# Patient Record
Sex: Male | Born: 1945
Health system: Southern US, Community
[De-identification: ages and names within clinical notes are randomized; demographics above are authoritative.]

## PROBLEM LIST (undated history)

## (undated) DIAGNOSIS — I509 Heart failure, unspecified: Secondary | ICD-10-CM

## (undated) DIAGNOSIS — Z8616 Personal history of COVID-19: Secondary | ICD-10-CM

## (undated) DIAGNOSIS — I10 Essential (primary) hypertension: Secondary | ICD-10-CM

## (undated) DIAGNOSIS — J309 Allergic rhinitis, unspecified: Secondary | ICD-10-CM

## (undated) DIAGNOSIS — I872 Venous insufficiency (chronic) (peripheral): Secondary | ICD-10-CM

## (undated) DIAGNOSIS — J449 Chronic obstructive pulmonary disease, unspecified: Secondary | ICD-10-CM

## (undated) DIAGNOSIS — I4891 Unspecified atrial fibrillation: Secondary | ICD-10-CM

## (undated) DIAGNOSIS — C4490 Unspecified malignant neoplasm of skin, unspecified: Secondary | ICD-10-CM

## (undated) DIAGNOSIS — E785 Hyperlipidemia, unspecified: Secondary | ICD-10-CM

## (undated) DIAGNOSIS — R011 Cardiac murmur, unspecified: Secondary | ICD-10-CM

## (undated) DIAGNOSIS — K5730 Diverticulosis of large intestine without perforation or abscess without bleeding: Secondary | ICD-10-CM

## (undated) DIAGNOSIS — F419 Anxiety disorder, unspecified: Secondary | ICD-10-CM

## (undated) DIAGNOSIS — N529 Male erectile dysfunction, unspecified: Secondary | ICD-10-CM

## (undated) HISTORY — DX: Chronic obstructive pulmonary disease, unspecified: J44.9

## (undated) HISTORY — DX: Allergic rhinitis, unspecified: J30.9

## (undated) HISTORY — DX: Unspecified atrial fibrillation: I48.91

## (undated) HISTORY — DX: Personal history of COVID-19: Z86.16

## (undated) HISTORY — DX: Unspecified malignant neoplasm of skin, unspecified: C44.90

## (undated) HISTORY — PX: TONSILLECTOMY AND ADENOIDECTOMY: SUR1326

## (undated) HISTORY — DX: Cardiac murmur, unspecified: R01.1

## (undated) HISTORY — DX: Essential (primary) hypertension: I10

## (undated) HISTORY — DX: Hyperlipidemia, unspecified: E78.5

## (undated) HISTORY — PX: VEIN LIGATION AND STRIPPING: SHX2653

## (undated) HISTORY — DX: Male erectile dysfunction, unspecified: N52.9

## (undated) HISTORY — DX: Venous insufficiency (chronic) (peripheral): I87.2

## (undated) HISTORY — DX: Diverticulosis of large intestine without perforation or abscess without bleeding: K57.30

## (undated) HISTORY — DX: Anxiety disorder, unspecified: F41.9

## (undated) HISTORY — PX: OTHER SURGICAL HISTORY: SHX169

## (undated) SURGERY — ATRIAL FIBRILLATION ABLATION
Anesthesia: Monitor Anesthesia Care

---

## 2006-05-08 ENCOUNTER — Ambulatory Visit: Payer: Self-pay | Admitting: Internal Medicine

## 2006-05-12 ENCOUNTER — Other Ambulatory Visit: Payer: Self-pay

## 2006-05-12 ENCOUNTER — Ambulatory Visit: Payer: Self-pay | Admitting: Internal Medicine

## 2010-06-27 HISTORY — PX: SKIN CANCER EXCISION: SHX779

## 2011-01-18 DIAGNOSIS — E7800 Pure hypercholesterolemia, unspecified: Secondary | ICD-10-CM | POA: Insufficient documentation

## 2011-01-18 DIAGNOSIS — Z85828 Personal history of other malignant neoplasm of skin: Secondary | ICD-10-CM | POA: Insufficient documentation

## 2011-01-18 DIAGNOSIS — Z9889 Other specified postprocedural states: Secondary | ICD-10-CM | POA: Insufficient documentation

## 2011-04-15 DIAGNOSIS — Z79899 Other long term (current) drug therapy: Secondary | ICD-10-CM | POA: Insufficient documentation

## 2011-06-17 ENCOUNTER — Ambulatory Visit: Payer: Self-pay | Admitting: Family Medicine

## 2011-11-24 DIAGNOSIS — R001 Bradycardia, unspecified: Secondary | ICD-10-CM | POA: Insufficient documentation

## 2012-03-23 DIAGNOSIS — G4733 Obstructive sleep apnea (adult) (pediatric): Secondary | ICD-10-CM | POA: Insufficient documentation

## 2012-06-21 LAB — PSA: PSA: 4.66

## 2012-10-20 ENCOUNTER — Encounter: Payer: Self-pay | Admitting: Cardiovascular Disease

## 2012-10-20 ENCOUNTER — Ambulatory Visit (INDEPENDENT_AMBULATORY_CARE_PROVIDER_SITE_OTHER): Payer: 59 | Admitting: Cardiovascular Disease

## 2012-10-20 ENCOUNTER — Encounter: Payer: Self-pay | Admitting: *Deleted

## 2012-10-20 VITALS — BP 110/68 | HR 63 | Ht 71.0 in | Wt 174.5 lb

## 2012-10-20 DIAGNOSIS — I4891 Unspecified atrial fibrillation: Secondary | ICD-10-CM | POA: Insufficient documentation

## 2012-10-20 NOTE — Patient Instructions (Addendum)
Your physician wants you to follow-up in: 6 months with Dr. Nahser. You will receive a reminder letter in the mail two months in advance. If you don't receive a letter, please call our office to schedule the follow-up appointment.  

## 2012-10-20 NOTE — Progress Notes (Signed)
Steven Griffin Date of Birth  January 03, 1946       [facility] Office [address]  [address] [place], [place]  [address]   [place], [place]  [address] [phone]     [phone]   Fax  [phone]    Fax [phone]  Problem List: 1. Atrial fibrillation 2. Mild Chronic systolic Congestive heart failure-ejection fraction of  45-50% by echo, 25-35% by TEE in 2008  History of Present Illness:  Steven Griffin is a 67 yo with hx of atrial fibrillation.  He as been on [place] for 5  years.  He went to Dr. Macon Griffin at [facility] who he has been seeing for years.   He has had brief epidoses of atrial fib (30 min. - 1 hr)  .  Yesterday, he had a 3 hour episode of AF.  He converted to NSR spontaneously at that time.  He called his doctor at [facility] and was suggested that he find a local cardiologist to help manage these episodes of paroxysmal atrial fibrillation.   He has noted some leg swelling / ankle swlling recently - thinks it was due to localized swelling due to walking on the golf course at [place].    He exercises 30 minutes a day.    He has otherwise been healthy. He does not have any episodes of chest pain or shortness of breath. He is able to do all of his normal activities without any significant problems.   Dr. Macon Griffin has suggested that he get an ablation.   Steven Griffin has been somewhat hesitant to get the ablation.  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    Current Outpatient Prescriptions  Medication Sig Dispense Refill  . aspirin 325 MG tablet Take 325 mg by mouth daily.      . Cholecalciferol (VITAMIN D3) 3000 UNITS TABS Take by mouth daily.      Marland Kitchen diltiazem (CARDIZEM) 30 MG tablet Take 30 mg by mouth 4 (four) times daily as needed.      . dofetilide (TIKOSYN) 125 MCG capsule Takes 3 tablets twice daily.      . fexofenadine (ALLEGRA) 180 MG tablet Take 180 mg by mouth daily.      Marland Kitchen gemfibrozil (LOPID) 600 MG tablet  Take 600 mg by mouth daily.       Marland Kitchen lisinopril (PRINIVIL,ZESTRIL) 5 MG tablet Take 5 mg by mouth daily.      . Multiple Vitamin (MULTIVITAMIN) tablet Take 1 tablet by mouth daily.      . Omega-3 Fatty Acids (FISH OIL) 1000 MG CAPS Take by mouth daily.      . tamsulosin (FLOMAX) 0.4 MG CAPS Take 0.4 mg by mouth daily.       No current facility-administered medications for this visit.     No Known Allergies  Past Medical History  Diagnosis Date  . A-fib   . Mild anxiety   . Benign tumor     left axilla  . Impotence of organic origin   . COPD (chronic obstructive pulmonary disease)   . Atrial fibrillation   . Special screening for malignant neoplasm of prostate   . Hyperlipidemia   . Essential hypertension, benign   . Other abnormal glucose   . Diverticulosis of colon (without mention of hemorrhage)   . Allergic rhinitis, cause unspecified   . Unspecified venous (peripheral) insufficiency     Past Surgical History  Procedure Laterality Date  . Varicose veins    . Tonsillectomy and adenoidectomy    . Rib cartilage      benign tumor    History  Smoking status  . Current Every Day Smoker -- 1.00 packs/day for 30 years  . Types: Cigarettes, Cigars  Smokeless tobacco  . Not on file    History  Alcohol Use No    History reviewed. No pertinent family history.  Reviw of Systems:  Reviewed in the HPI.  All other systems are negative.  Physical Exam: Blood pressure 110/68, pulse 63, height 5\' 11"  (1.803 m), weight 174 lb 8 oz (79.153 kg). General: Well developed, well nourished, in no acute distress.  Head: Normocephalic, atraumatic, sclera non-icteric, mucus membranes are moist,   Neck: Supple. Carotids are 2 + without bruits. No JVD   Lungs: Clear   Heart: RR, normal S1, S2  Abdomen: Soft, non-tender, non-distended with normal bowel sounds.  Msk:  Strength and tone are normal   Extremities: No clubbing or cyanosis. No edema.  Distal pedal pulses are 2+ and  equal    Neuro: CN II - XII intact.  Alert and oriented X 3.   Psych:  Normal  ECG: October 20, 2012:  NSR at 64, RBBB.   Assessment / Plan:

## 2012-10-20 NOTE — Assessment & Plan Note (Addendum)
Steven Griffin is doing well.  He had a longer than normal episode of AF yesterday.  He already has a sleep study scheduled. He denies any excessive alcohol use.  He smokes cigars on occasion.  Today he is back in normal sinus rhythm and feels completely normal. His talk to Dr. Macon Large at [facility] and they had discussed doing an RF ablation.  At this point I do not think we need to change any of his medications. I'll see him again in 6 months for followup visit and EKG.

## 2012-10-22 ENCOUNTER — Ambulatory Visit: Payer: Self-pay | Admitting: Cardiovascular Disease

## 2013-02-15 ENCOUNTER — Ambulatory Visit (INDEPENDENT_AMBULATORY_CARE_PROVIDER_SITE_OTHER): Payer: 59 | Admitting: Cardiovascular Disease

## 2013-02-15 ENCOUNTER — Encounter: Payer: Self-pay | Admitting: Cardiovascular Disease

## 2013-02-15 ENCOUNTER — Encounter (INDEPENDENT_AMBULATORY_CARE_PROVIDER_SITE_OTHER): Payer: Self-pay

## 2013-02-15 VITALS — BP 100/62 | HR 47 | Ht 71.0 in | Wt 178.0 lb

## 2013-02-15 DIAGNOSIS — R079 Chest pain, unspecified: Secondary | ICD-10-CM

## 2013-02-15 DIAGNOSIS — I951 Orthostatic hypotension: Secondary | ICD-10-CM

## 2013-02-15 DIAGNOSIS — I4891 Unspecified atrial fibrillation: Secondary | ICD-10-CM

## 2013-02-15 NOTE — Assessment & Plan Note (Signed)
He has not had any further episodes of A-Fib.  He will follow up with his doctor at [facility].

## 2013-02-15 NOTE — Patient Instructions (Signed)
Your physician wants you to follow-up in: 6 months with Dr. Nahser. You will receive a reminder letter in the mail two months in advance. If you don't receive a letter, please call our office to schedule the follow-up appointment.  Your physician recommends that you continue on your current medications as directed. Please refer to the Current Medication list given to you today.  

## 2013-02-15 NOTE — Assessment & Plan Note (Signed)
He is having problems with orthostatic hypotension.  He does not have these symptoms after working out in his gym - typically only after golf.    I suspect he is volume depleted despite the fact that he insists that he drinks water all day.   I have suggested that he increase his intake of electrolytes (V-8 juice) and protein (hard boiled eggs) and stay better hydrated.   He will also increase the intake of his electrolyte solutions ( instead of water)   Will see him in 6 months.   Will check BMP and Magnesium levels.  He has these labs drawn at [facility] regularly.

## 2013-02-15 NOTE — Progress Notes (Signed)
Steven Griffin Date of Birth  [birth year]       [facility] Office [address]  [address] [place], [place]  [address]   [place], [place]  [address] [phone]     [phone]   Fax  [phone]    Fax [phone]  Problem List: 1. Atrial fibrillation 2. Mild Chronic systolic Congestive heart failure-ejection fraction of  45-50% by echo, 25-35% by TEE in 2008  History of Present Illness:  Steven Griffin is a 67 yo with hx of atrial fibrillation.  He as been on [place] for 5  years.  He went to Dr. Macon Large at [facility] who he has been seeing for years.   He has had brief epidoses of atrial fib (30 min. - 1 hr)  .  Yesterday, he had a 3 hour episode of AF.  He converted to NSR spontaneously at that time.  He called his doctor at [facility] and was suggested that he find a local cardiologist to help manage these episodes of paroxysmal atrial fibrillation.   He has noted some leg swelling / ankle swlling recently - thinks it was due to localized swelling due to walking on the golf course at [place].    He exercises 30 minutes a day.    He has otherwise been healthy. He does not have any episodes of chest pain or shortness of breath. He is able to do all of his normal activities without any significant problems.   Dr. Macon Large has suggested that he get an ablation.   Steven Griffin has been somewhat hesitant to get the ablation.  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    Oct. 21, 2014:  Steven Griffin presents today for some episodes of dizziness and dyspnea - especially during and after playing golf.  He has symptoms of orthostasis after teeing up his ball.   He has no problems working out at [employer]    Current Outpatient Prescriptions  Medication Sig Dispense Refill  . aspirin 325 MG tablet Take 325 mg by mouth daily.      . Cholecalciferol (VITAMIN D3) 3000 UNITS TABS Take by mouth daily.      Marland Kitchen diltiazem (CARDIZEM) 30 MG tablet Take 30 mg  by mouth 4 (four) times daily as needed.      . dofetilide (TIKOSYN) 125 MCG capsule Takes 3 tablets twice daily.      . fexofenadine (ALLEGRA) 180 MG tablet Take 180 mg by mouth daily.      Marland Kitchen lisinopril (PRINIVIL,ZESTRIL) 5 MG tablet Take 5 mg by mouth daily.      . Multiple Vitamin (MULTIVITAMIN) tablet Take 1 tablet by mouth daily.      . Omega-3 Fatty Acids (FISH OIL) 1000 MG CAPS Take by mouth daily.      . tamsulosin (FLOMAX) 0.4 MG CAPS Take 0.4 mg by mouth daily.       No current facility-administered medications for this visit.     No Known Allergies  Past Medical History  Diagnosis Date  . A-fib   . Mild anxiety   . Benign tumor     left axilla  . Impotence of organic origin   . COPD (chronic obstructive pulmonary disease)   . Atrial fibrillation   . Special screening for malignant neoplasm of prostate   . Hyperlipidemia   . Essential hypertension, benign   . Other abnormal glucose   .  Diverticulosis of colon (without mention of hemorrhage)   . Allergic rhinitis, cause unspecified   . Unspecified venous (peripheral) insufficiency     Past Surgical History  Procedure Laterality Date  . Varicose veins    . Tonsillectomy and adenoidectomy    . Rib cartilage      benign tumor    History  Smoking status  . Current Every Day Smoker -- 1.00 packs/day for 30 years  . Types: Cigarettes, Cigars  Smokeless tobacco  . Not on file    History  Alcohol Use  . 0.6 oz/week  . 1 Glasses of wine per week    Family History  Problem Relation Age of Onset  . Family history unknown: Yes    Reviw of Systems:  Reviewed in the HPI.  All other systems are negative.  Physical Exam: Blood pressure 100/62, pulse 47, height 5\' 11"  (1.803 m), weight 178 lb (80.74 kg). General: Well developed, well nourished, in no acute distress.  Head: Normocephalic, atraumatic, sclera non-icteric, mucus membranes are moist,   Neck: Supple. Carotids are 2 + without bruits. No JVD    Lungs: Clear   Heart: RR, normal S1, S2  Abdomen: Soft, non-tender, non-distended with normal bowel sounds.  Msk:  Strength and tone are normal   Extremities: No clubbing or cyanosis. No edema.  Distal pedal pulses are 2+ and equal    Neuro: CN II - XII intact.  Alert and oriented X 3.   Psych:  Normal  ECG: Oct. 21, 2014: Sinus brady at 89,  RBBB with LAD.    Assessment / Plan:

## 2013-03-03 ENCOUNTER — Other Ambulatory Visit: Payer: Self-pay

## 2013-06-07 DIAGNOSIS — I452 Bifascicular block: Secondary | ICD-10-CM | POA: Insufficient documentation

## 2013-08-08 ENCOUNTER — Encounter: Payer: Self-pay | Admitting: Cardiovascular Disease

## 2013-08-08 ENCOUNTER — Ambulatory Visit (INDEPENDENT_AMBULATORY_CARE_PROVIDER_SITE_OTHER): Payer: Medicare Other | Admitting: Cardiovascular Disease

## 2013-08-08 ENCOUNTER — Ambulatory Visit: Payer: 59 | Admitting: Cardiovascular Disease

## 2013-08-08 VITALS — BP 101/70 | HR 76 | Ht 71.0 in | Wt 177.5 lb

## 2013-08-08 DIAGNOSIS — I4891 Unspecified atrial fibrillation: Secondary | ICD-10-CM

## 2013-08-08 DIAGNOSIS — I509 Heart failure, unspecified: Secondary | ICD-10-CM

## 2013-08-08 DIAGNOSIS — I5022 Chronic systolic (congestive) heart failure: Secondary | ICD-10-CM | POA: Insufficient documentation

## 2013-08-08 NOTE — Assessment & Plan Note (Signed)
He is maintaining NSR.   Cont tikosyn.

## 2013-08-08 NOTE — Progress Notes (Signed)
Steven Griffin Date of Birth  10-19-1945       [facility] Office [address], [place], [place]  [address]   [place], [place]  [address] [phone]     [phone]   Fax  [phone]    Fax [phone]  Problem List: 1. Atrial fibrillation 2. Mild Chronic systolic Congestive heart failure-ejection fraction of  45-50% by echo, 25-35% by TEE in 2008  History of Present Illness:  Steven Griffin is a 68 yo with hx of atrial fibrillation.  He as been on [place] for 5  years.  He went to Steven Griffin at [facility] who he has been seeing for years.   He has had brief epidoses of atrial fib (30 min. - 1 hr)  .  Yesterday, he had a 3 hour episode of AF.  He converted to NSR spontaneously at that time.  He called his doctor at [facility] and was suggested that he find a local cardiologist to help manage these episodes of paroxysmal atrial fibrillation.   He has noted some leg swelling / ankle swlling recently - thinks it was due to localized swelling due to walking on the golf course at [place].    He exercises 30 minutes a day.    He has otherwise been healthy. He does not have any episodes of chest pain or shortness of breath. He is able to do all of his normal activities without any significant problems.   Steven Griffin has suggested that he get an ablation.   Steven Griffin has been somewhat hesitant to get the ablation.  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    Oct. 21, 2014:  Steven Griffin for some episodes of dizziness and dyspnea - especially during and after playing golf.  He has symptoms of orthostasis after teeing up his ball.   He has no problems working out at [employer].    August 08, 2013:  Steven Griffin is doing well.  No episode of dizziness.  He has had some generalized weakness and fatigue.  He is watching his diet.    Working out some    Current Outpatient Prescriptions  Medication Sig Dispense Refill  .  aspirin 325 MG tablet Take 325 mg by mouth daily.      . CHELATED CALCIUM PO Take 2 tablets by mouth daily.       . Cholecalciferol (VITAMIN D-3) 5000 UNITS TABS Take by mouth daily.      . Digestive Enzymes (DIGESTIVE ENZYME PO) Take by mouth 3 (three) times daily.      Marland Kitchen diltiazem (CARDIZEM) 30 MG tablet Take 30 mg by mouth 4 (four) times daily as needed.      . dofetilide (TIKOSYN) 125 MCG capsule Takes 3 tablets twice daily.      . fexofenadine (ALLEGRA) 180 MG tablet Take 180 mg by mouth daily.      Marland Kitchen HAWTHORNE BERRY PO Take by mouth daily.      Marland Kitchen HORSE CHESTNUT PO Take by mouth daily.      Marland Kitchen lisinopril (PRINIVIL,ZESTRIL) 5 MG tablet Take 5 mg by mouth daily.      Marland Kitchen MAGNESIUM-POTASSIUM PO Take 2 tablets by mouth daily.      . Multiple Vitamin (MULTIVITAMIN) tablet Take 1 tablet by mouth daily.      . Multiple Vitamins-Minerals (Elida PO) Take by mouth daily.      Marland Kitchen  NON FORMULARY Ultimate Prostate 1 tablet twice daily.      . NON FORMULARY HCL 4 tablets daily with meal.      . NON FORMULARY AMPK Takes 2 tablets in the am and 1 tablet in the pm.      . NON FORMULARY Fucoxothin Takes 1 tablet after each meal daily.      . NON FORMULARY Irvingia Takes 4 tablets daily.      . Omega-3 Fatty Acids (FISH OIL) 1000 MG CAPS Take by mouth daily.      . Probiotic Product (PROBIOTIC DAILY PO) Take by mouth daily.      . tamsulosin (FLOMAX) 0.4 MG CAPS Take 0.4 mg by mouth daily.       No current facility-administered medications for this visit.     No Known Allergies  Past Medical History  Diagnosis Date  . A-fib   . Mild anxiety   . Benign tumor     left axilla  . Impotence of organic origin   . COPD (chronic obstructive pulmonary disease)   . Atrial fibrillation   . Special screening for malignant neoplasm of prostate   . Hyperlipidemia   . Essential hypertension, benign   . Other abnormal glucose   . Diverticulosis of colon (without mention of hemorrhage)   . Allergic  rhinitis, cause unspecified   . Unspecified venous (peripheral) insufficiency     Past Surgical History  Procedure Laterality Date  . Varicose veins    . Tonsillectomy and adenoidectomy    . Rib cartilage      benign tumor    History  Smoking status  . Current Every Day Smoker -- 1.00 packs/day for 30 years  . Types: Cigarettes, Cigars  Smokeless tobacco  . Not on file    History  Alcohol Use  . 0.6 oz/week  . 1 Glasses of wine per week    Family History  Problem Relation Age of Onset  . Family history unknown: Yes    Reviw of Systems:  Reviewed in the HPI.  All other systems are negative.  Physical Exam: Blood pressure 101/70, pulse 76, height 5\' 11"  (1.803 m), weight 177 lb 8 oz (80.513 kg). General: Well developed, well nourished, in no acute distress.  Head: Normocephalic, atraumatic, sclera non-icteric, mucus membranes are moist,  Neck: Supple. Carotids are 2 + without bruits. No JVD  Lungs: Clear  Heart: RR, normal S1, S2 Abdomen: Soft, non-tender, non-distended with normal bowel sounds. Msk:  Strength and tone are normal  Extremities: No clubbing or cyanosis. No edema.  Distal pedal pulses are 2+ and equal  Neuro: CN II - XII intact.  Alert and oriented X 3.  Psych:  Normal  ECG: August 08, 2013:  NSR at 59.  RBBB,  LAHB.      Assessment / Plan:

## 2013-08-08 NOTE — Assessment & Plan Note (Signed)
Steven Griffin has a hx of CHF - his original EF was 25-30 according to past echoes at [facility].  The more recent assessment of LV function revealed an EF of 45-50%.  He seems to be doing well. He has some episodes of orthostatic hypotension which is probably do to accommodation of his lisinopril and the [place] suppliment  that he is taking.  Will get an echo in 6 months and I'll see him 1 week after the echo.    He is doing very well.  No CP or dyspnea.    Continue other meds

## 2013-08-08 NOTE — Patient Instructions (Signed)
Your physician has requested that you have an echocardiogram in 6 months. Echocardiography is a painless test that uses sound waves to create images of your heart. It provides your doctor with information about the size and shape of your heart and how well your heart's chambers and valves are working. This procedure takes approximately one hour. There are no restrictions for this procedure.  Your physician wants you to follow-up in: in 6 months about a week after your echo. You will receive a reminder letter in the mail two months in advance. If you don't receive a letter, please call our office to schedule the follow-up appointment.

## 2014-02-07 ENCOUNTER — Other Ambulatory Visit: Payer: Self-pay

## 2014-02-07 ENCOUNTER — Other Ambulatory Visit: Payer: Self-pay | Admitting: *Deleted

## 2014-02-07 ENCOUNTER — Other Ambulatory Visit (INDEPENDENT_AMBULATORY_CARE_PROVIDER_SITE_OTHER): Payer: Medicare Other

## 2014-02-07 DIAGNOSIS — I509 Heart failure, unspecified: Secondary | ICD-10-CM

## 2014-02-07 DIAGNOSIS — I4891 Unspecified atrial fibrillation: Secondary | ICD-10-CM

## 2014-02-07 DIAGNOSIS — I5022 Chronic systolic (congestive) heart failure: Secondary | ICD-10-CM

## 2014-02-07 MED ORDER — LISINOPRIL 5 MG PO TABS: 5.0000 mg | ORAL_TABLET | Freq: Every day | ORAL | Status: DC

## 2014-02-07 NOTE — Telephone Encounter (Signed)
Any problems with this refill please call patient.

## 2014-03-13 ENCOUNTER — Ambulatory Visit: Payer: No Typology Code available for payment source | Admitting: Cardiovascular Disease

## 2014-03-31 ENCOUNTER — Ambulatory Visit: Payer: No Typology Code available for payment source | Admitting: Cardiovascular Disease

## 2014-04-10 ENCOUNTER — Encounter: Payer: Self-pay | Admitting: Cardiovascular Disease

## 2014-04-10 ENCOUNTER — Ambulatory Visit (INDEPENDENT_AMBULATORY_CARE_PROVIDER_SITE_OTHER): Payer: Medicare Other | Admitting: Cardiovascular Disease

## 2014-04-10 VITALS — BP 108/70 | HR 61 | Ht 71.0 in | Wt 178.8 lb

## 2014-04-10 DIAGNOSIS — I4891 Unspecified atrial fibrillation: Secondary | ICD-10-CM

## 2014-04-10 DIAGNOSIS — I509 Heart failure, unspecified: Secondary | ICD-10-CM

## 2014-04-10 NOTE — Assessment & Plan Note (Signed)
This is by hx.   EF was normal at last echo May need to repeat his echo

## 2014-04-10 NOTE — Progress Notes (Signed)
Steven Griffin Date of Birth  01/15/1946       [place] [address], [address], [place], [place]  [address]   [place], [place]  [address] [phone]     [phone]   Fax  [phone]    Fax [phone]  Problem List: 1. Atrial fibrillation 2. Mild Chronic systolic Congestive heart failure-ejection fraction was normal in 2008.   History of Present Illness:  Steven Griffin is a 68 yo with hx of atrial fibrillation.  He as been on [place] for 5  years.  He went to Dr. Omelia Blackwater at [facility] who he has been seeing for years.   He has had brief epidoses of atrial fib (30 min. - 1 hr)  .  Yesterday, he had a 3 hour episode of AF.  He converted to NSR spontaneously at that time.  He called his doctor at [facility] and was suggested that he find a local cardiologist to help manage these episodes of paroxysmal atrial fibrillation.  He has noted some leg swelling / ankle swlling recently - thinks it was due to localized swelling due to walking on the golf course at [place].    He exercises 30 minutes a day.    He has otherwise been healthy. He does not have any episodes of chest pain or shortness of breath. He is able to do all of his normal activities without any significant problems.  Dr. Omelia Blackwater has suggested that he get an ablation.   Steven Griffin has been somewhat hesitant to get the ablation. 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    Oct. 21, 2014:  Steven Griffin presents today for some episodes of dizziness and dyspnea - especially during and after playing golf.  He has symptoms of orthostasis after teeing up his ball.   He has no problems working out at [employer].    August 08, 2013:  Steven Griffin is doing well.  No episode of dizziness.  He has had some generalized weakness and fatigue.  He is watching his diet.    Working out some  04/10/2014:  Steven Griffin is a 68 yo who I follow for paroxysmal atrial fib.  His last echo in 2008 showed  normal EF   EXAM RESULTS:   Left Main Coronary Artery: 0  Left Anterior Descending Coronary Artery: 20.52 Left Circumflex Coronary Artery: 0  Right Coronary Artery: 5.49  TOTAL SCORE: 26.02  COMMENTS: No evidence of pericardial or pleural effusion. Visualized lungs show no suspicious pulmonary nodules. Peripheral linear opacity involving the lingula of the left upper lobe likely represents scarring. No suspicious lytic or sclerotic bone lesions are visualized.   He had a coronary calcium score by his Integrative Health MD at [facility]   1. Coronary artery calcification: Minimal  2. Risk of cardiovascular disease: Low   Coronary artery calcification is a specific indicator of atherosclerosis, and the quantity of calcification correlates with the severity of atherosclerosis and the probability of obstructive coronary disease. A score of 0 does not absolutely rule out the presence of atherosclerotic plaque, including unstable plaque, but does imply a very low likelihood of significant luminal obstruction. A total score of 100 or less implies virtually no significant obstructive disease. Exceptions can occur, rarely, in young patients with "soft" (uncalcified) plaque, not detected by this exam. A high score (>400) indicates significant plaque burden, with a relatively high risk of future  cardiovascular event. Calcification is not necessarily site specific for stenosis, however indicates the overall plaque burden in the coronary arteries.  Overall feels quite well. intermittant palpitations. Does not want to take a blood thinner.  No CP or dypsnea.     Current Outpatient Prescriptions  Medication Sig Dispense Refill  . aspirin 325 MG tablet Take 325 mg by mouth daily.    . Cholecalciferol (VITAMIN D PO) Take 7,000 mg by mouth daily.    . Digestive Enzymes (DIGESTIVE ENZYME PO) Take by mouth 3 (three) times daily.    Marland Kitchen diltiazem (CARDIZEM) 30 MG tablet Take 30 mg by mouth 4 (four) times  daily as needed.    . dofetilide (TIKOSYN) 125 MCG capsule Takes 3 tablets twice daily.    . fexofenadine (ALLEGRA) 180 MG tablet Take 180 mg by mouth as needed.     Marland Kitchen HORSE CHESTNUT PO Take by mouth daily.    Marland Kitchen lisinopril (PRINIVIL,ZESTRIL) 5 MG tablet Take 1 tablet (5 mg total) by mouth daily. 30 tablet 6  . MAGNESIUM-POTASSIUM PO Take 2 tablets by mouth daily.    . Multiple Vitamin (MULTIVITAMIN) tablet Take 1 tablet by mouth daily.    . Multiple Vitamins-Minerals ([place] PO) Take by mouth daily.    . NON FORMULARY Ultimate Prostate 1 tablet twice daily.    . NON FORMULARY HCL 4 tablets daily with meal.    . NON FORMULARY AMPK Takes 2 tablets in the am and 1 tablet in the pm.    . NON FORMULARY Fucoxothin Takes 1 tablet after each meal daily.    . NON FORMULARY Irvingia Takes 2 tablets daily.    . NON FORMULARY Super Micro Forte Takes 2 tablets by mouth BID as needed.    . Nutritional Supplements (DHEA PO) Take by mouth. Takes 2 tablets bid as needed.    . Omega-3 Fatty Acids (FISH OIL) 1000 MG CAPS Take by mouth daily.    . Probiotic Product (PROBIOTIC DAILY PO) Take by mouth daily.    . tamsulosin (FLOMAX) 0.4 MG CAPS Take 0.4 mg by mouth daily.     No current facility-administered medications for this visit.     No Known Allergies  Past Medical History  Diagnosis Date  . A-fib   . Mild anxiety   . Benign tumor     left axilla  . Impotence of organic origin   . COPD (chronic obstructive pulmonary disease)   . Atrial fibrillation   . Special screening for malignant neoplasm of prostate   . Hyperlipidemia   . Essential hypertension, benign   . Other abnormal glucose   . Diverticulosis of colon (without mention of hemorrhage)   . Allergic rhinitis, cause unspecified   . Unspecified venous (peripheral) insufficiency     Past Surgical History  Procedure Laterality Date  . Varicose veins    . Tonsillectomy and adenoidectomy    . Rib cartilage      benign  tumor    History  Smoking status  . Current Every Day Smoker -- 1.00 packs/day for 30 years  . Types: Cigarettes, Cigars  Smokeless tobacco  . Not on file    History  Alcohol Use  . 0.6 oz/week  . 1 Glasses of wine per week    Family History  Problem Relation Age of Onset  . Family history unknown: Yes    Reviw of Systems:  Reviewed in the HPI.  All other systems are negative.  Physical Exam: Blood  pressure 108/70, pulse 61, height 5\' 11"  (1.803 m), weight 178 lb 12 oz (81.08 kg). General: Well developed, well nourished, in no acute distress.  Head: Normocephalic, atraumatic, sclera non-icteric, mucus membranes are moist,  Neck: Supple. Carotids are 2 + without bruits. No JVD  Lungs: Clear  Heart: RR, normal S1, S2 Abdomen: Soft, non-tender, non-distended with normal bowel sounds. Msk:  Strength and tone are normal  Extremities: No clubbing or cyanosis. No edema.  Distal pedal pulses are 2+ and equal  Neuro: CN II - XII intact.  Alert and oriented X 3.  Psych:  Normal  ECG:   Dec. 14, 2015:  NSR at 61,  Normal        Assessment / Plan:

## 2014-04-10 NOTE — Patient Instructions (Signed)
Your physician recommends that you continue on your current medications as directed. Please refer to the Current Medication list given to you today.  Your physician wants you to follow-up in: 6 months with Dr. Nahser.  You will receive a reminder letter in the mail two months in advance. If you don't receive a letter, please call our office to schedule the follow-up appointment.  

## 2014-04-10 NOTE — Assessment & Plan Note (Addendum)
He has been on coumadin in the past and is very hesitant to start any anticoagulation. Did not have a very good experience.  We discussed the NOACS.   we discussed the CHADS VASC 2 scoring system - is 2-3 ( age, mild HTN, hx of "CHF" although EF is normal now. He is followed at [facility] and here.  Had a CT coronary calcium score of 26 ( low risk)

## 2014-05-05 ENCOUNTER — Telehealth: Payer: Self-pay | Admitting: Cardiovascular Disease

## 2014-05-09 NOTE — Telephone Encounter (Signed)
error 

## 2014-10-25 ENCOUNTER — Encounter: Payer: Self-pay | Admitting: Family Medicine

## 2014-10-26 ENCOUNTER — Ambulatory Visit: Payer: Self-pay | Admitting: Family Medicine

## 2014-10-26 ENCOUNTER — Ambulatory Visit (INDEPENDENT_AMBULATORY_CARE_PROVIDER_SITE_OTHER): Payer: Medicare PPO

## 2014-10-26 ENCOUNTER — Encounter: Payer: Self-pay | Admitting: Cardiovascular Disease

## 2014-10-26 ENCOUNTER — Ambulatory Visit (INDEPENDENT_AMBULATORY_CARE_PROVIDER_SITE_OTHER): Payer: Medicare PPO | Admitting: Cardiovascular Disease

## 2014-10-26 VITALS — BP 120/60 | HR 55 | Ht 71.0 in | Wt 174.8 lb

## 2014-10-26 DIAGNOSIS — I4891 Unspecified atrial fibrillation: Secondary | ICD-10-CM | POA: Diagnosis not present

## 2014-10-26 DIAGNOSIS — M7989 Other specified soft tissue disorders: Secondary | ICD-10-CM

## 2014-10-26 DIAGNOSIS — I452 Bifascicular block: Secondary | ICD-10-CM

## 2014-10-26 NOTE — Patient Instructions (Addendum)
Medication Instructions: - no changes  Labwork: - none  Procedures/Testing: - Your physician has requested that you have a lower extremity venous duplex. This test is an ultrasound of the veins in the legs. It looks at venous blood flow that carries blood from the heart to the legs. Allow one hour for a Lower Venous exam.  There are no restrictions or special instructions. ** Come back to the office at 1:45 pm today for the scan to be done **  Follow-Up: - Your physician wants you to follow-up in: 6 months with Dr. Acie Fredrickson (December 2016) You will receive a reminder letter in the mail two months in advance. If you don't receive a letter, please call our office to schedule the follow-up appointment.  Any Additional Special Instructions Will Be Listed Below (If Applicable).

## 2014-10-26 NOTE — Progress Notes (Signed)
[name] Date of Birth  1946/03/01       [place] [address], [place], [place]  [address]   [place], [place]  [address] [phone]     [phone]   Fax  [phone]    Fax [phone]  Problem List: 1. Atrial fibrillation 2. Mild Chronic systolic Congestive heart failure-ejection fraction was normal in 2008.   History of Present Illness:  [name] is a 69 yo with hx of atrial fibrillation.  He as been on [place] for 5  years.  He went to Dr. Omelia Blackwater at [facility] who he has been seeing for years.   He has had brief epidoses of atrial fib (30 min. - 1 hr)  .  Yesterday, he had a 3 hour episode of AF.  He converted to NSR spontaneously at that time.  He called his doctor at [facility] and was suggested that he find a local cardiologist to help manage these episodes of paroxysmal atrial fibrillation.  He has noted some leg swelling / ankle swlling recently - thinks it was due to localized swelling due to walking on the golf course at [place].    He exercises 30 minutes a day.    He has otherwise been healthy. He does not have any episodes of chest pain or shortness of breath. He is able to do all of his normal activities without any significant problems.  Dr. Omelia Blackwater has suggested that he get an ablation.   [name] has been somewhat hesitant to get the ablation. 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    Oct. 21, 2014:  [name] presents today for some episodes of dizziness and dyspnea - especially during and after playing golf.  He has symptoms of orthostasis after teeing up his ball.   He has no problems working out at [employer].    August 08, 2013:  Jafet is doing well.  No episode of dizziness.  He has had some generalized weakness and fatigue.  He is watching his diet.    Working out some  04/10/2014:  [name] is a 69 yo who I follow for paroxysmal atrial fib.  His last echo in 2008 showed  normal EF    October 26, 2014:  Has developed leg pain .  Had severe leg pain in his right lower leg. Has improved some but is still swollen Went to [facility] and was in a convention for a week.  Flight was only 4 hours.  No extra salt. Not able to exercise, has played some golf.     EXAM RESULTS:   Left Main Coronary Artery: 0  Left Anterior Descending Coronary Artery: 20.52 Left Circumflex Coronary Artery: 0  Right Coronary Artery: 5.49  TOTAL SCORE: 26.02  COMMENTS: No evidence of pericardial or pleural effusion. Visualized lungs show no suspicious pulmonary nodules. Peripheral linear opacity involving the lingula of the left upper lobe likely represents scarring. No suspicious lytic or sclerotic bone lesions are visualized.   He had a coronary calcium score by his Integrative Health MD at [facility]   1. Coronary artery calcification: Minimal  2. Risk of cardiovascular disease: Low   Coronary artery calcification is a specific indicator of atherosclerosis, and the quantity of calcification correlates with the severity of atherosclerosis and the probability of obstructive coronary disease. A score of 0 does not absolutely rule out the presence  of atherosclerotic plaque, including unstable plaque, but does imply a very low likelihood of significant luminal obstruction. A total score of 100 or less implies virtually no significant obstructive disease. Exceptions can occur, rarely, in young patients with "soft" (uncalcified) plaque, not detected by this exam. A high score (>400) indicates significant plaque burden, with a relatively high risk of future cardiovascular event. Calcification is not necessarily site specific for stenosis, however indicates the overall plaque burden in the coronary arteries.  Overall feels quite well. intermittant palpitations. Does not want to take a blood thinner.  No CP or dypsnea.     Current Outpatient Prescriptions  Medication Sig Dispense Refill   . aspirin 325 MG tablet Take 325 mg by mouth daily.    . Cholecalciferol (VITAMIN D PO) Take 7,000 mg by mouth daily.    . Digestive Enzymes (DIGESTIVE ENZYME PO) Take by mouth 3 (three) times daily.    Marland Kitchen diltiazem (CARDIZEM) 30 MG tablet Take 30 mg by mouth 4 (four) times daily as needed.    . dofetilide (TIKOSYN) 125 MCG capsule Takes 3 tablets twice daily.    . fexofenadine (ALLEGRA) 180 MG tablet Take 180 mg by mouth as needed.     Marland Kitchen HORSE CHESTNUT PO Take by mouth daily.    Marland Kitchen lisinopril (PRINIVIL,ZESTRIL) 5 MG tablet Take 1 tablet (5 mg total) by mouth daily. 30 tablet 6  . MAGNESIUM-POTASSIUM PO Take 2 tablets by mouth daily.    . Multiple Vitamin (MULTIVITAMIN) tablet Take 1 tablet by mouth daily.    . Multiple Vitamins-Minerals ([place] PO) Take by mouth daily.    . NON FORMULARY Ultimate Prostate 1 tablet twice daily.    . NON FORMULARY HCL 4 tablets daily with meal.    . NON FORMULARY AMPK Takes 2 tablets in the am and 1 tablet in the pm.    . NON FORMULARY Fucoxothin Takes 1 tablet after each meal daily.    . NON FORMULARY Irvingia Takes 2 tablets daily.    . NON FORMULARY Super Micro Forte Takes 2 tablets by mouth BID as needed.    . Nutritional Supplements (DHEA PO) Take by mouth. Takes 2 tablets bid as needed.    . Omega-3 Fatty Acids (FISH OIL) 1000 MG CAPS Take by mouth daily.    . Probiotic Product (PROBIOTIC DAILY PO) Take by mouth daily.    . tamsulosin (FLOMAX) 0.4 MG CAPS Take 0.4 mg by mouth daily.     No current facility-administered medications for this visit.     No Known Allergies  Past Medical History  Diagnosis Date  . A-fib   . Mild anxiety   . Benign tumor     left axilla  . Impotence of organic origin   . COPD (chronic obstructive pulmonary disease)   . Atrial fibrillation   . Special screening for malignant neoplasm of prostate   . Hyperlipidemia   . Essential hypertension, benign   . Other abnormal glucose   . Diverticulosis of  colon (without mention of hemorrhage)   . Allergic rhinitis, cause unspecified   . Unspecified venous (peripheral) insufficiency     Past Surgical History  Procedure Laterality Date  . Varicose veins    . Tonsillectomy and adenoidectomy    . Rib cartilage      benign tumor    History  Smoking status  . Current Every Day Smoker -- 1.00 packs/day for 30 years  . Types: Cigarettes, Cigars  Smokeless tobacco  .  Not on file    History  Alcohol Use  . 0.6 oz/week  . 1 Glasses of wine per week    Family History  Problem Relation Age of Onset  . Family history unknown: Yes    Reviw of Systems:  Reviewed in the HPI.  All other systems are negative.  Physical Exam: Blood pressure 120/60, pulse 55, height 5\' 11"  (1.803 m), weight 79.266 kg (174 lb 12 oz). General: Well developed, well nourished, in no acute distress.  Head: Normocephalic, atraumatic, sclera non-icteric, mucus membranes are moist,  Neck: Supple. Carotids are 2 + without bruits. No JVD  Lungs: Clear  Heart: RR, normal S1, S2 Abdomen: Soft, non-tender, non-distended with normal bowel sounds. Msk:  Strength and tone are normal  Extremities: No clubbing or cyanosis. No edema.  Distal pedal pulses are 2+ and equal  Neuro: CN II - XII intact.  Alert and oriented X 3.  Psych:  Normal  ECG:   Dec. 14, 2015:  NSR at 61,  Normal        Assessment / Plan:   1. Atrial fibrillation-  He is very stable.  . Continue current meds   2. Mild Chronic systolic Congestive heart failure-ejection fraction was normal in 2008.  3. Leg pain :   Concerning for DVT. Will send him for a venous doppler today .   I think he needs to be started on a NOAC.  Will discuss    Chetara Kropp, Wonda Cheng, MD  10/26/2014 11:02 AM    [place],  [place], [place]  [address] Pager [phone] Phone: [phone]; Fax: [phone]   [facility]  [address] [place], [place]  [address] [phone]    Fax [phone]

## 2014-10-27 ENCOUNTER — Telehealth: Payer: Self-pay | Admitting: Family Medicine

## 2014-10-27 ENCOUNTER — Ambulatory Visit (INDEPENDENT_AMBULATORY_CARE_PROVIDER_SITE_OTHER): Payer: Medicare PPO | Admitting: Family Medicine

## 2014-10-27 ENCOUNTER — Encounter: Payer: Self-pay | Admitting: Family Medicine

## 2014-10-27 VITALS — BP 118/64 | HR 61 | Temp 98.1°F | Resp 16 | Ht 71.0 in | Wt 176.5 lb

## 2014-10-27 DIAGNOSIS — M79604 Pain in right leg: Secondary | ICD-10-CM | POA: Diagnosis not present

## 2014-10-27 DIAGNOSIS — F5221 Male erectile disorder: Secondary | ICD-10-CM | POA: Insufficient documentation

## 2014-10-27 DIAGNOSIS — N400 Enlarged prostate without lower urinary tract symptoms: Secondary | ICD-10-CM | POA: Insufficient documentation

## 2014-10-27 DIAGNOSIS — I8390 Asymptomatic varicose veins of unspecified lower extremity: Secondary | ICD-10-CM

## 2014-10-27 DIAGNOSIS — R600 Localized edema: Secondary | ICD-10-CM | POA: Diagnosis not present

## 2014-10-27 DIAGNOSIS — K5730 Diverticulosis of large intestine without perforation or abscess without bleeding: Secondary | ICD-10-CM | POA: Insufficient documentation

## 2014-10-27 DIAGNOSIS — I8393 Asymptomatic varicose veins of bilateral lower extremities: Secondary | ICD-10-CM | POA: Diagnosis not present

## 2014-10-27 DIAGNOSIS — I872 Venous insufficiency (chronic) (peripheral): Secondary | ICD-10-CM | POA: Insufficient documentation

## 2014-10-27 DIAGNOSIS — R251 Tremor, unspecified: Secondary | ICD-10-CM | POA: Insufficient documentation

## 2014-10-27 MED ORDER — TRAMADOL HCL 50 MG PO TABS: 50.0000 mg | ORAL_TABLET | Freq: Four times a day (QID) | ORAL | Status: DC | PRN

## 2014-10-27 NOTE — Progress Notes (Signed)
Name: Steven Griffin   MRN: [id number]    DOB: May 28, 1945   Date:10/27/2014       Progress Note  Subjective  Chief Complaint  Chief Complaint  Patient presents with  . Leg Pain    Starts from muscle cramp in right leg and has constant in certain positions.     HPI  Leg Pain/Sweeling: symptoms started one week ago, he developed a muscle cramp during the night that woke him up during the night, the following morning he woke up and the right foot was swollen , tender and had ecchymosis on lateral aspect of right foot. He has pain with compression of left lower calf, swelling also noticed. Pain is slightly better than it was last week. Pain at this time is zero with rest, but with palpation it is 9/10.  Patient Active Problem List   Diagnosis Date Noted  . Chronic venous insufficiency 10/27/2014  . Intermittent tremor 10/27/2014  . ED (erectile dysfunction) of non-organic origin 10/27/2014  . Colon, diverticulosis 10/27/2014  . Benign prostatic hypertrophy without urinary obstruction 10/27/2014  . CHF (congestive heart failure) 08/08/2013  . Bundle branch block, right and left anterior fascicular 06/07/2013  . Orthostatic hypotension 02/15/2013  . Atrial fibrillation 10/20/2012  . Obstructive apnea 03/23/2012  . Bradycardia 11/24/2011  . Polypharmacy 04/15/2011  . Hypercholesteremia 01/18/2011  . H/O squamous cell carcinoma of skin 01/18/2011  . History of major vascular surgery 01/18/2011    Past Surgical History  Procedure Laterality Date  . Vein ligation and stripping    . Tonsillectomy and adenoidectomy    . Rib cartilage      benign tumor    Family History  Problem Relation Age of Onset  . Leukemia Father   . Hypercholesterolemia Father   . Suicidality Mother   . Colon cancer Maternal Grandmother   . Hypertension Brother   . Hyperlipidemia Brother   . Cirrhosis Brother   . Alcoholism Brother     History   Social History  . Marital Status: Married    Spouse  Name: Neoma Laming  . Number of Children: 2  . Years of Education: 14   Occupational History  . Product manager    Social History Main Topics  . Smoking status: Current Every Day Smoker -- 1.00 packs/day for 30 years    Types: Cigarettes, Cigars  . Smokeless tobacco: Not on file  . Alcohol Use: 0.6 oz/week    1 Glasses of wine per week     Comment: 2-3 glasses of wine at night  . Drug Use: No  . Sexual Activity: Yes   Other Topics Concern  . Not on file   Social History Narrative     Current outpatient prescriptions:  .  5-Hydroxytryptophan (5-HTP) 100 MG CAPS, Take by mouth., Disp: , Rfl:  .  ALPHA LIPOIC ACID PO, Take by mouth., Disp: , Rfl:  .  aspirin 325 MG tablet, Take 325 mg by mouth daily., Disp: , Rfl:  .  Cholecalciferol (VITAMIN D PO), Take 7,000 mg by mouth daily., Disp: , Rfl:  .  Digestive Enzymes (DIGESTIVE ENZYME PO), Take by mouth 3 (three) times daily., Disp: , Rfl:  .  diltiazem (CARDIZEM) 30 MG tablet, Take 30 mg by mouth 4 (four) times daily as needed., Disp: , Rfl:  .  dofetilide (TIKOSYN) 125 MCG capsule, Takes 3 tablets twice daily., Disp: , Rfl:  .  fexofenadine (ALLEGRA) 180 MG tablet, Take 180 mg by mouth as needed. ,  Disp: , Rfl:  .  fluticasone (FLONASE) 50 MCG/ACT nasal spray, Place 1 spray into the nose daily., Disp: , Rfl:  .  lisinopril (PRINIVIL,ZESTRIL) 5 MG tablet, Take 1 tablet (5 mg total) by mouth daily., Disp: 30 tablet, Rfl: 6 .  MAGNESIUM-POTASSIUM PO, Take 2 tablets by mouth daily., Disp: , Rfl:  .  Multiple Vitamin (MULTIVITAMIN) tablet, Take 1 tablet by mouth daily., Disp: , Rfl:  .  Multiple Vitamins-Minerals ([place]), Take by mouth daily., Disp: , Rfl:  .  NON FORMULARY, AMPK Takes 2 tablets in the am and 1 tablet in the pm., Disp: , Rfl:  .  NON FORMULARY, Fucoxothin Takes 1 tablet after each meal daily., Disp: , Rfl:  .  NON FORMULARY, Irvingia Takes 2 tablets daily., Disp: , Rfl:  .  NON FORMULARY, Super Micro  Forte Takes 2 tablets by mouth BID as needed., Disp: , Rfl:  .  Omega-3 Fatty Acids (FISH OIL) 1000 MG CAPS, Take by mouth daily., Disp: , Rfl:  .  Probiotic Product (PROBIOTIC DAILY PO), Take by mouth daily., Disp: , Rfl:  .  RED YEAST RICE EXTRACT PO, Take by mouth., Disp: , Rfl:  .  tamsulosin (FLOMAX) 0.4 MG CAPS, Take 0.4 mg by mouth daily., Disp: , Rfl:  .  traMADol (ULTRAM) 50 MG tablet, Take 1 tablet (50 mg total) by mouth every 6 (six) hours as needed for moderate pain., Disp: 30 tablet, Rfl: 0  No Known Allergies   ROS  Constitutional: Negative for fever or weight change.  Respiratory: Negative for cough and shortness of breath.   Cardiovascular: Negative for chest pain or palpitations. He has right leg swelling Gastrointestinal: Negative for abdominal pain, no bowel changes.  Musculoskeletal: Negative for gait problem or joint swelling.  Skin: Negative for rash.  Neurological: Negative for dizziness or headache.  No other specific complaints in a complete review of systems (except as listed in HPI above).   Objective  Filed Vitals:   10/27/14 1439  BP: 118/64  Pulse: 61  Temp: 98.1 F (36.7 C)  TempSrc: Oral  Resp: 16  Height: 5\' 11"  (1.803 m)  Weight: 176 lb 8 oz (80.06 kg)  SpO2: 97%    Body mass index is 24.63 kg/(m^2).  Physical Exam  Constitutional: Patient appears well-developed and well-nourished. No distress.  Eyes:  No scleral icterus. PERL Neck: Normal range of motion. Neck supple. Cardiovascular: Normal rate, regular rhythm and normal heart sounds.  No murmur heard.   Pitting at ankle 2-3 plus, more on right ankle. Varicose veins on right leg more than left  Pulmonary/Chest: Effort normal and breath sounds normal. No respiratory distress. Abdominal: Soft.  There is no tenderness. Psychiatric: Patient has a normal mood and affect. behavior is normal. Judgment and thought content normal. Skin: mild erythema of right ankle, ecchymosis of medial  aspect of foot Muscular skeletal: severe tenderness during palpation of distal calf, mild pain with dorsiflexion of right foot, normal knee exam except for fullness on right popliteal fossa    PHQ2/9: Depression screen PHQ 2/9 10/27/2014  Decreased Interest 0  Down, Depressed, Hopeless 0  PHQ - 2 Score 0     Fall Risk: Fall Risk  10/27/2014  Falls in the past year? No    Assessment & Plan  1. Leg edema, right Use compression stocking hose, pain medication, refer to vascular surgeon, since doppler US was neg, but difficulty visualizing the right calf. He has varicose veins.  Also explained it may have been a muscle rupture and we will also order an MRI and refer to Ortho if positive  2. Varicose vein of leg Refer to vascular   3. Pain of right lower extremity  - Ambulatory referral to Vascular Surgery - traMADol (ULTRAM) 50 MG tablet; Take 1 tablet (50 mg total) by mouth every 6 (six) hours as needed for moderate pain.  Dispense: 30 tablet; Refill: 0 - MR Tibia Fibula Left Wo Contrast; Future

## 2014-10-27 NOTE — Telephone Encounter (Signed)
Patient states he has had discomfort in his right leg. He has swelling also discoloration in he heel area. He has had a doppler done and clot has been ruled out. He wants to come in to see you if at all possible. Patient had an appt the other day but had to cancel due to being at the vein and vascular appt.

## 2014-10-27 NOTE — Telephone Encounter (Signed)
Could you put him in at 2 p.m.

## 2014-10-27 NOTE — Telephone Encounter (Signed)
Patient put on schudule for this afternoon.

## 2014-10-31 ENCOUNTER — Other Ambulatory Visit: Payer: Self-pay | Admitting: Family Medicine

## 2014-10-31 ENCOUNTER — Ambulatory Visit
Admission: RE | Admit: 2014-10-31 | Discharge: 2014-10-31 | Disposition: A | Discharge status: Home / Self Care | Payer: Medicare PPO | Source: Ambulatory Visit | Attending: Family Medicine | Admitting: Family Medicine

## 2014-10-31 DIAGNOSIS — M79604 Pain in right leg: Secondary | ICD-10-CM

## 2014-10-31 DIAGNOSIS — R936 Abnormal findings on diagnostic imaging of limbs: Secondary | ICD-10-CM | POA: Insufficient documentation

## 2014-10-31 DIAGNOSIS — R609 Edema, unspecified: Secondary | ICD-10-CM | POA: Diagnosis not present

## 2014-11-01 ENCOUNTER — Telehealth: Payer: Self-pay

## 2014-11-01 ENCOUNTER — Telehealth: Payer: Self-pay | Admitting: Family Medicine

## 2014-11-01 DIAGNOSIS — M79661 Pain in right lower leg: Secondary | ICD-10-CM

## 2014-11-01 NOTE — Telephone Encounter (Signed)
Pt.notified

## 2014-11-01 NOTE — Addendum Note (Signed)
Addended by: Steele Sizer F on: 11/01/2014 09:38 PM   Modules accepted: Orders

## 2014-11-01 NOTE — Telephone Encounter (Signed)
Referral ordered. Please notify patient  Thank you

## 2014-11-01 NOTE — Telephone Encounter (Signed)
Please put in referral for Ortho

## 2014-11-01 NOTE — Telephone Encounter (Signed)
Patient returning call back about MRI results.

## 2014-11-01 NOTE — Telephone Encounter (Signed)
-----   Message from Steele Sizer, MD sent at 10/31/2014  8:33 PM EDT ----- Based on results possible partial tear of soleus muscle , please refer to podiatrist or Ortho for further management.  Please notify patient, thank you

## 2014-11-14 ENCOUNTER — Ambulatory Visit: Payer: No Typology Code available for payment source | Admitting: Cardiovascular Disease

## 2014-11-17 ENCOUNTER — Encounter: Payer: Self-pay | Admitting: *Deleted

## 2014-11-20 ENCOUNTER — Ambulatory Visit
Admission: RE | Admit: 2014-11-20 | Discharge: 2014-11-20 | Disposition: A | Discharge status: Home / Self Care | Payer: Medicare PPO | Source: Ambulatory Visit | Attending: Unknown Physician Specialty | Admitting: Unknown Physician Specialty

## 2014-11-20 ENCOUNTER — Ambulatory Visit: Payer: Medicare PPO | Admitting: Anesthesiology

## 2014-11-20 ENCOUNTER — Encounter: Payer: Self-pay | Admitting: Anesthesiology

## 2014-11-20 ENCOUNTER — Encounter
Admission: RE | Disposition: A | Discharge status: Home / Self Care | Payer: Self-pay | Source: Ambulatory Visit | Attending: Unknown Physician Specialty

## 2014-11-20 DIAGNOSIS — F17200 Nicotine dependence, unspecified, uncomplicated: Secondary | ICD-10-CM | POA: Diagnosis not present

## 2014-11-20 DIAGNOSIS — K648 Other hemorrhoids: Secondary | ICD-10-CM | POA: Insufficient documentation

## 2014-11-20 DIAGNOSIS — K5730 Diverticulosis of large intestine without perforation or abscess without bleeding: Secondary | ICD-10-CM | POA: Diagnosis not present

## 2014-11-20 DIAGNOSIS — I10 Essential (primary) hypertension: Secondary | ICD-10-CM | POA: Insufficient documentation

## 2014-11-20 DIAGNOSIS — Z1211 Encounter for screening for malignant neoplasm of colon: Secondary | ICD-10-CM | POA: Diagnosis not present

## 2014-11-20 HISTORY — PX: COLONOSCOPY WITH PROPOFOL: SHX5780

## 2014-11-20 SURGERY — COLONOSCOPY WITH PROPOFOL
Anesthesia: General

## 2014-11-20 MED ORDER — EPHEDRINE SULFATE 50 MG/ML IJ SOLN
INTRAMUSCULAR | Status: DC | PRN
  Administered 2014-11-20: 10 mg via INTRAVENOUS
  Administered 2014-11-20: 5 mg via INTRAVENOUS

## 2014-11-20 MED ORDER — MIDAZOLAM HCL 2 MG/2ML IJ SOLN
INTRAMUSCULAR | Status: DC | PRN
  Administered 2014-11-20: 1 mg via INTRAVENOUS

## 2014-11-20 MED ORDER — LACTATED RINGERS IV SOLN
INTRAVENOUS | Status: DC | PRN
  Administered 2014-11-20: 14:00:00 via INTRAVENOUS

## 2014-11-20 MED ORDER — FENTANYL CITRATE (PF) 100 MCG/2ML IJ SOLN
INTRAMUSCULAR | Status: DC | PRN
  Administered 2014-11-20: 50 ug via INTRAVENOUS

## 2014-11-20 MED ORDER — PROPOFOL INFUSION 10 MG/ML OPTIME
INTRAVENOUS | Status: DC | PRN
  Administered 2014-11-20: 10 ug/kg/min via INTRAVENOUS

## 2014-11-20 NOTE — Anesthesia Preprocedure Evaluation (Signed)
Anesthesia Evaluation  Patient identified by MRN, date of birth, ID band Patient awake    Reviewed: Allergy & Precautions, NPO status , Patient's Chart, lab work & pertinent test results  History of Anesthesia Complications Negative for: history of anesthetic complications  Airway Mallampati: I  TM Distance: >3 FB Neck ROM: Full    Dental  (+) Teeth Intact   Pulmonary sleep apnea (pt denies) , COPD (pt denies)Current Smoker (1 ppd),          Cardiovascular hypertension, Pt. on medications +CHF (pt denies) + dysrhythmias Atrial Fibrillation     Neuro/Psych Anxiety    GI/Hepatic   Endo/Other    Renal/GU      Musculoskeletal   Abdominal   Peds  Hematology   Anesthesia Other Findings   Reproductive/Obstetrics                             Anesthesia Physical Anesthesia Plan  ASA: II  Anesthesia Plan: General   Post-op Pain Management:    Induction: Intravenous  Airway Management Planned: Nasal Cannula  Additional Equipment:   Intra-op Plan:   Post-operative Plan:   Informed Consent: I have reviewed the patients History and Physical, chart, labs and discussed the procedure including the risks, benefits and alternatives for the proposed anesthesia with the patient or authorized representative who has indicated his/her understanding and acceptance.     Plan Discussed with:   Anesthesia Plan Comments:         Anesthesia Quick Evaluation

## 2014-11-20 NOTE — H&P (Signed)
Primary Care Physician:  Loistine Chance, MD Primary Gastroenterologist:  Dr. Vira Agar  Pre-Procedure History & Physical: HPI:  Steven Griffin is a 69 y.o. male is here for an colonoscopy.   Past Medical History  Diagnosis Date  . A-fib   . Mild anxiety   . Benign tumor     left axilla  . Impotence of organic origin   . COPD (chronic obstructive pulmonary disease)   . Atrial fibrillation   . Special screening for malignant neoplasm of prostate   . Hyperlipidemia   . Essential hypertension, benign   . Other abnormal glucose   . Diverticulosis of colon (without mention of hemorrhage)   . Allergic rhinitis, cause unspecified   . Unspecified venous (peripheral) insufficiency   . Cancer     Past Surgical History  Procedure Laterality Date  . Vein ligation and stripping    . Tonsillectomy and adenoidectomy    . Rib cartilage      benign tumor  . Skin cancer excision Right 06/2010    lower leg    Prior to Admission medications   Medication Sig Start Date End Date Taking? Authorizing Provider  5-Hydroxytryptophan (5-HTP) 100 MG CAPS Take by mouth.   Yes Historical Provider, MD  ALPHA LIPOIC ACID PO Take by mouth.   Yes Historical Provider, MD  aspirin 325 MG tablet Take 325 mg by mouth daily.   Yes Historical Provider, MD  Cholecalciferol (VITAMIN D PO) Take 7,000 mg by mouth daily.   Yes Historical Provider, MD  dabigatran (PRADAXA) 150 MG CAPS capsule Take 150 mg by mouth 2 (two) times daily.   Yes Historical Provider, MD  Digestive Enzymes (DIGESTIVE ENZYME PO) Take by mouth 3 (three) times daily.   Yes Historical Provider, MD  dofetilide (TIKOSYN) 125 MCG capsule Takes 3 tablets twice daily.   Yes Historical Provider, MD  fexofenadine (ALLEGRA) 180 MG tablet Take 180 mg by mouth as needed.    Yes Historical Provider, MD  fluticasone (FLONASE) 50 MCG/ACT nasal spray Place 1 spray into the nose daily. 08/12/13  Yes Historical Provider, MD  lisinopril (PRINIVIL,ZESTRIL) 5 MG  tablet Take 1 tablet (5 mg total) by mouth daily. 02/07/14  Yes Minna Merritts, MD  MAGNESIUM-POTASSIUM PO Take 2 tablets by mouth daily.   Yes Historical Provider, MD  Multiple Vitamin (MULTIVITAMIN) tablet Take 1 tablet by mouth daily.   Yes Historical Provider, MD  Multiple Vitamins-Minerals ([place]) Take by mouth daily.   Yes Historical Provider, MD  NON FORMULARY AMPK Takes 2 tablets in the am and 1 tablet in the pm.   Yes Historical Provider, MD  NON FORMULARY Fucoxothin Takes 1 tablet after each meal daily.   Yes Historical Provider, MD  NON FORMULARY Irvingia Takes 2 tablets daily.   Yes Historical Provider, MD  NON FORMULARY Super Micro Forte Takes 2 tablets by mouth BID as needed.   Yes Historical Provider, MD  Omega-3 Fatty Acids (FISH OIL) 1000 MG CAPS Take by mouth daily.   Yes Historical Provider, MD  Probiotic Product (PROBIOTIC DAILY PO) Take by mouth daily.   Yes Historical Provider, MD  tamsulosin (FLOMAX) 0.4 MG CAPS Take 0.4 mg by mouth daily.   Yes Historical Provider, MD  traMADol (ULTRAM) 50 MG tablet Take 1 tablet (50 mg total) by mouth every 6 (six) hours as needed for moderate pain. 10/27/14  Yes Steele Sizer, MD  diltiazem (CARDIZEM) 30 MG tablet Take 30 mg by mouth  4 (four) times daily as needed.    Historical Provider, MD  RED YEAST RICE EXTRACT PO Take by mouth.    Historical Provider, MD    Allergies as of 10/17/2014  . (No Known Allergies)    Family History  Problem Relation Age of Onset  . Leukemia Father   . Hypercholesterolemia Father   . Suicidality Mother   . Colon cancer Maternal Grandmother   . Hypertension Brother   . Hyperlipidemia Brother   . Cirrhosis Brother   . Alcoholism Brother     History   Social History  . Marital Status: Married    Spouse Name: Neoma Laming  . Number of Children: 2  . Years of Education: 14   Occupational History  . Product manager    Social History Main Topics  . Smoking status: Current  Every Day Smoker -- 1.00 packs/day for 30 years    Types: Cigarettes, Cigars  . Smokeless tobacco: Not on file  . Alcohol Use: 0.6 oz/week    1 Glasses of wine per week     Comment: 2-3 glasses of wine at night  . Drug Use: No  . Sexual Activity: Yes   Other Topics Concern  . Not on file   Social History Narrative    Review of Systems: See HPI, otherwise negative ROS  Physical Exam: BP 127/86 mmHg  Pulse 56  Temp(Src) 97.1 F (36.2 C) (Tympanic)  Resp 20  SpO2 100% General:   Alert,  pleasant and cooperative in NAD Head:  Normocephalic and atraumatic. Neck:  Supple; no masses or thyromegaly. Lungs:  Clear throughout to auscultation.    Heart:  Regular rate and rhythm. Abdomen:  Soft, nontender and nondistended. Normal bowel sounds, without guarding, and without rebound.   Neurologic:  Alert and  oriented x4;  grossly normal neurologically.  Impression/Plan: [name] is here for an colonoscopy to be performed for screening   Risks, benefits, limitations, and alternatives regarding  colonoscopy have been reviewed with the patient.  Questions have been answered.  All parties agreeable.   [name], MD  11/20/2014, 2:50 PM   Primary Care Physician:  Loistine Chance, MD Primary Gastroenterologist:  Dr. Vira Agar  Pre-Procedure History & Physical: HPI:  Steven Griffin is a 69 y.o. male is here for an colonoscopy.   Past Medical History  Diagnosis Date  . A-fib   . Mild anxiety   . Benign tumor     left axilla  . Impotence of organic origin   . COPD (chronic obstructive pulmonary disease)   . Atrial fibrillation   . Special screening for malignant neoplasm of prostate   . Hyperlipidemia   . Essential hypertension, benign   . Other abnormal glucose   . Diverticulosis of colon (without mention of hemorrhage)   . Allergic rhinitis, cause unspecified   . Unspecified venous (peripheral) insufficiency   . Cancer     Past Surgical History  Procedure  Laterality Date  . Vein ligation and stripping    . Tonsillectomy and adenoidectomy    . Rib cartilage      benign tumor  . Skin cancer excision Right 06/2010    lower leg    Prior to Admission medications   Medication Sig Start Date End Date Taking? Authorizing Provider  5-Hydroxytryptophan (5-HTP) 100 MG CAPS Take by mouth.   Yes Historical Provider, MD  ALPHA LIPOIC ACID PO Take by mouth.   Yes Historical Provider, MD  aspirin 325 MG  tablet Take 325 mg by mouth daily.   Yes Historical Provider, MD  Cholecalciferol (VITAMIN D PO) Take 7,000 mg by mouth daily.   Yes Historical Provider, MD  dabigatran (PRADAXA) 150 MG CAPS capsule Take 150 mg by mouth 2 (two) times daily.   Yes Historical Provider, MD  Digestive Enzymes (DIGESTIVE ENZYME PO) Take by mouth 3 (three) times daily.   Yes Historical Provider, MD  dofetilide (TIKOSYN) 125 MCG capsule Takes 3 tablets twice daily.   Yes Historical Provider, MD  fexofenadine (ALLEGRA) 180 MG tablet Take 180 mg by mouth as needed.    Yes Historical Provider, MD  fluticasone (FLONASE) 50 MCG/ACT nasal spray Place 1 spray into the nose daily. 08/12/13  Yes Historical Provider, MD  lisinopril (PRINIVIL,ZESTRIL) 5 MG tablet Take 1 tablet (5 mg total) by mouth daily. 02/07/14  Yes Minna Merritts, MD  MAGNESIUM-POTASSIUM PO Take 2 tablets by mouth daily.   Yes Historical Provider, MD  Multiple Vitamin (MULTIVITAMIN) tablet Take 1 tablet by mouth daily.   Yes Historical Provider, MD  Multiple Vitamins-Minerals ([place]) Take by mouth daily.   Yes Historical Provider, MD  NON FORMULARY AMPK Takes 2 tablets in the am and 1 tablet in the pm.   Yes Historical Provider, MD  NON FORMULARY Fucoxothin Takes 1 tablet after each meal daily.   Yes Historical Provider, MD  NON FORMULARY Irvingia Takes 2 tablets daily.   Yes Historical Provider, MD  NON FORMULARY Super Micro Forte Takes 2 tablets by mouth BID as needed.   Yes Historical Provider, MD   Omega-3 Fatty Acids (FISH OIL) 1000 MG CAPS Take by mouth daily.   Yes Historical Provider, MD  Probiotic Product (PROBIOTIC DAILY PO) Take by mouth daily.   Yes Historical Provider, MD  tamsulosin (FLOMAX) 0.4 MG CAPS Take 0.4 mg by mouth daily.   Yes Historical Provider, MD  traMADol (ULTRAM) 50 MG tablet Take 1 tablet (50 mg total) by mouth every 6 (six) hours as needed for moderate pain. 10/27/14  Yes Steele Sizer, MD  diltiazem (CARDIZEM) 30 MG tablet Take 30 mg by mouth 4 (four) times daily as needed.    Historical Provider, MD  RED YEAST RICE EXTRACT PO Take by mouth.    Historical Provider, MD    Allergies as of 10/17/2014  . (No Known Allergies)    Family History  Problem Relation Age of Onset  . Leukemia Father   . Hypercholesterolemia Father   . Suicidality Mother   . Colon cancer Maternal Grandmother   . Hypertension Brother   . Hyperlipidemia Brother   . Cirrhosis Brother   . Alcoholism Brother     History   Social History  . Marital Status: Married    Spouse Name: Neoma Laming  . Number of Children: 2  . Years of Education: 14   Occupational History  . Product manager    Social History Main Topics  . Smoking status: Current Every Day Smoker -- 1.00 packs/day for 30 years    Types: Cigarettes, Cigars  . Smokeless tobacco: Not on file  . Alcohol Use: 0.6 oz/week    1 Glasses of wine per week     Comment: 2-3 glasses of wine at night  . Drug Use: No  . Sexual Activity: Yes   Other Topics Concern  . Not on file   Social History Narrative    Review of Systems: See HPI, otherwise negative ROS  Physical Exam: BP 127/86 mmHg  Pulse 56  Temp(Src) 97.1 F (36.2 C) (Tympanic)  Resp 20  SpO2 100% General:   Alert,  pleasant and cooperative in NAD Head:  Normocephalic and atraumatic. Neck:  Supple; no masses or thyromegaly. Lungs:  Clear throughout to auscultation.    Heart:  Rate of 56, no murmurs. Abdomen:  Soft, nontender and nondistended.  Normal bowel sounds, without guarding, and without rebound.   Neurologic:  Alert and  oriented x4;  grossly normal neurologically.  Impression/Plan: [name] is here for an colonoscopy to be performed for screening  Risks, benefits, limitations, and alternatives regarding  colonoscopy have been reviewed with the patient.  Questions have been answered.  All parties agreeable.   [name], MD  11/20/2014, 2:50 PM

## 2014-11-20 NOTE — Anesthesia Procedure Notes (Signed)
Performed by: COOK-MARTIN, Aleena Kirkeby Pre-anesthesia Checklist: Patient identified, Emergency Drugs available, Suction available, Patient being monitored and Timeout performed Patient Re-evaluated:Patient Re-evaluated prior to inductionOxygen Delivery Method: Nasal cannula Preoxygenation: Pre-oxygenation with 100% oxygen Intubation Type: IV induction Placement Confirmation: positive ETCO2 and CO2 detector       

## 2014-11-20 NOTE — Transfer of Care (Signed)
Immediate Anesthesia Transfer of Care Note  Patient: Steven Griffin  Procedure(s) Performed: Procedure(s): COLONOSCOPY WITH PROPOFOL (N/A)  Patient Location: PACU  Anesthesia Type:General  Level of Consciousness: awake, alert , oriented and sedated  Airway & Oxygen Therapy: Patient Spontanous Breathing and Patient connected to nasal cannula oxygen  Post-op Assessment: Report given to RN and Post -op Vital signs reviewed and stable  Post vital signs: Reviewed and stable  Last Vitals:  Filed Vitals:   11/20/14 1332  BP: 127/86  Pulse: 56  Temp: 36.2 C  Resp: 20    Complications: No apparent anesthesia complications

## 2014-11-20 NOTE — Anesthesia Postprocedure Evaluation (Signed)
  Anesthesia Post-op Note  Patient: Steven Griffin  Procedure(s) Performed: Procedure(s): COLONOSCOPY WITH PROPOFOL (N/A)  Anesthesia type:General  Patient location: PACU  Post pain: Pain level controlled  Post assessment: Post-op Vital signs reviewed, Patient's Cardiovascular Status Stable, Respiratory Function Stable, Patent Airway and No signs of Nausea or vomiting  Post vital signs: Reviewed and stable  Last Vitals:  Filed Vitals:   11/20/14 1537  BP: 109/61  Pulse: 91  Temp:   Resp: 17    Level of consciousness: awake, alert  and patient cooperative  Complications: No apparent anesthesia complications

## 2014-11-20 NOTE — Op Note (Signed)
[facility] Gastroenterology Patient Name: Steven Griffin Procedure Date: 11/20/2014 2:53 PM MRN: [id number] Account #: [id number] Date of Birth: May 30, 1945 Admit Type: Outpatient Age: 69 Room: [facility] ENDO ROOM 1 Gender: Male Note Status: Finalized Procedure:         Colonoscopy Indications:       Screening for colorectal malignant neoplasm Providers:         Manya Silvas, MD Referring MD:      Bethena Roys. Sowles, MD (Referring MD) Medicines:         Propofol per Anesthesia Complications:     No immediate complications. Procedure:         Pre-Anesthesia Assessment:                    - After reviewing the risks and benefits, the patient was                     deemed in satisfactory condition to undergo the procedure.                    After obtaining informed consent, the colonoscope was                     passed under direct vision. Throughout the procedure, the                     patient's blood pressure, pulse, and oxygen saturations                     were monitored continuously. The Colonoscope was                     introduced through the anus and advanced to the the cecum,                     identified by appendiceal orifice and ileocecal valve. The                     colonoscopy was performed without difficulty. The patient                     tolerated the procedure well. The quality of the bowel                     preparation was good. Findings:      Prostate enlarged but no nodules felt.      Many small- medium-mouthed diverticula were found in the sigmoid colon       and in the descending colon.      Internal hemorrhoids were found during endoscopy. The hemorrhoids were       medium-sized and Grade I (internal hemorrhoids that do not prolapse).      The exam was otherwise without abnormality. Impression:        - Diverticulosis in the sigmoid colon and in the                     descending colon.                    - Internal hemorrhoids.                    - The examination was otherwise normal.                    -  No specimens collected. Recommendation:    - Repeat colonoscopy in 10 years for screening purposes. Manya Silvas, MD 11/20/2014 3:15:20 PM This report has been signed electronically. Number of Addenda: 0 Note Initiated On: 11/20/2014 2:53 PM Scope Withdrawal Time: 0 hours 7 minutes 54 seconds  Total Procedure Duration: 0 hours 16 minutes 19 seconds       [facility]

## 2014-11-21 ENCOUNTER — Encounter: Payer: Self-pay | Admitting: Unknown Physician Specialty

## 2014-12-20 NOTE — H&P (Signed)
Primary Care Physician:  Loistine Chance, MD Primary Gastroenterologist:  Dr. Vira Agar  Pre-Procedure History & Physical: HPI:  Steven Griffin is a 69 y.o. male is here for an colonoscopy.   Past Medical History  Diagnosis Date  . A-fib   . Mild anxiety   . Benign tumor     left axilla  . Impotence of organic origin   . COPD (chronic obstructive pulmonary disease)   . Atrial fibrillation   . Special screening for malignant neoplasm of prostate   . Hyperlipidemia   . Essential hypertension, benign   . Other abnormal glucose   . Diverticulosis of colon (without mention of hemorrhage)   . Allergic rhinitis, cause unspecified   . Unspecified venous (peripheral) insufficiency   . Cancer     Past Surgical History  Procedure Laterality Date  . Vein ligation and stripping    . Tonsillectomy and adenoidectomy    . Rib cartilage      benign tumor  . Skin cancer excision Right 06/2010    lower leg  . Colonoscopy with propofol N/A 11/20/2014    Procedure: COLONOSCOPY WITH PROPOFOL;  Surgeon: Manya Silvas, MD;  Location: [facility] ENDOSCOPY;  Service: Endoscopy;  Laterality: N/A;    Prior to Admission medications   Medication Sig Start Date End Date Taking? Authorizing Provider  5-Hydroxytryptophan (5-HTP) 100 MG CAPS Take by mouth.   Yes Historical Provider, MD  ALPHA LIPOIC ACID PO Take by mouth.   Yes Historical Provider, MD  aspirin 325 MG tablet Take 325 mg by mouth daily.   Yes Historical Provider, MD  Cholecalciferol (VITAMIN D PO) Take 7,000 mg by mouth daily.   Yes Historical Provider, MD  dabigatran (PRADAXA) 150 MG CAPS capsule Take 150 mg by mouth 2 (two) times daily.   Yes Historical Provider, MD  Digestive Enzymes (DIGESTIVE ENZYME PO) Take by mouth 3 (three) times daily.   Yes Historical Provider, MD  dofetilide (TIKOSYN) 125 MCG capsule Takes 3 tablets twice daily.   Yes Historical Provider, MD  fexofenadine (ALLEGRA) 180 MG tablet Take 180 mg by mouth as needed.     Yes Historical Provider, MD  fluticasone (FLONASE) 50 MCG/ACT nasal spray Place 1 spray into the nose daily. 08/12/13  Yes Historical Provider, MD  lisinopril (PRINIVIL,ZESTRIL) 5 MG tablet Take 1 tablet (5 mg total) by mouth daily. 02/07/14  Yes Minna Merritts, MD  MAGNESIUM-POTASSIUM PO Take 2 tablets by mouth daily.   Yes Historical Provider, MD  Multiple Vitamin (MULTIVITAMIN) tablet Take 1 tablet by mouth daily.   Yes Historical Provider, MD  Multiple Vitamins-Minerals ([place]) Take by mouth daily.   Yes Historical Provider, MD  NON FORMULARY AMPK Takes 2 tablets in the am and 1 tablet in the pm.   Yes Historical Provider, MD  NON FORMULARY Fucoxothin Takes 1 tablet after each meal daily.   Yes Historical Provider, MD  NON FORMULARY [place] Takes 2 tablets daily.   Yes Historical Provider, MD  NON FORMULARY Super Micro Forte Takes 2 tablets by mouth BID as needed.   Yes Historical Provider, MD  Omega-3 Fatty Acids (FISH OIL) 1000 MG CAPS Take by mouth daily.   Yes Historical Provider, MD  Probiotic Product (PROBIOTIC DAILY PO) Take by mouth daily.   Yes Historical Provider, MD  tamsulosin (FLOMAX) 0.4 MG CAPS Take 0.4 mg by mouth daily.   Yes Historical Provider, MD  traMADol (ULTRAM) 50 MG tablet Take 1 tablet (50  mg total) by mouth every 6 (six) hours as needed for moderate pain. 10/27/14  Yes Steele Sizer, MD  diltiazem (CARDIZEM) 30 MG tablet Take 30 mg by mouth 4 (four) times daily as needed.    Historical Provider, MD  RED YEAST RICE EXTRACT PO Take by mouth.    Historical Provider, MD    Allergies as of 10/17/2014  . (No Known Allergies)    Family History  Problem Relation Age of Onset  . Leukemia Father   . Hypercholesterolemia Father   . Suicidality Mother   . Colon cancer Maternal Grandmother   . Hypertension Brother   . Hyperlipidemia Brother   . Cirrhosis Brother   . Alcoholism Brother     Social History   Social History  . Marital Status: Married     Spouse Name: Neoma Laming  . Number of Children: 2  . Years of Education: 14   Occupational History  . Product manager    Social History Main Topics  . Smoking status: Current Every Day Smoker -- 1.00 packs/day for 30 years    Types: Cigarettes, Cigars  . Smokeless tobacco: Not on file  . Alcohol Use: 0.6 oz/week    1 Glasses of wine per week     Comment: 2-3 glasses of wine at night  . Drug Use: No  . Sexual Activity: Yes   Other Topics Concern  . Not on file   Social History Narrative    Review of Systems: See HPI, otherwise negative ROS  Physical Exam: BP 115/61 mmHg  Pulse 51  Temp(Src) 96.3 F (35.7 C) (Tympanic)  Resp 15  SpO2 98% General:   Alert,  pleasant and cooperative in NAD Head:  Normocephalic and atraumatic. Neck:  Supple; no masses or thyromegaly. Lungs:  Clear throughout to auscultation.    Heart:  Regular rate and rhythm. Abdomen:  Soft, nontender and nondistended. Normal bowel sounds, without guarding, and without rebound.   Neurologic:  Alert and  oriented x4;  grossly normal neurologically.  Impression/Plan: [name] is here for an colonoscopy to be performed for screening  Risks, benefits, limitations, and alternatives regarding  colonoscopy have been reviewed with the patient.  Questions have been answered.  All parties agreeable.   [name], MD  12/20/2014, 12:33 PM

## 2015-03-12 ENCOUNTER — Other Ambulatory Visit: Payer: Self-pay | Admitting: Family Medicine

## 2015-03-12 NOTE — Telephone Encounter (Signed)
Patient requesting refill. 

## 2015-03-13 NOTE — Telephone Encounter (Signed)
Left voice message to inform patient that prescriptions have been sent to pharmacy but is needing to schedule appointment.

## 2015-03-15 ENCOUNTER — Telehealth: Payer: Self-pay | Admitting: *Deleted

## 2015-03-15 NOTE — Telephone Encounter (Signed)
lmov to let pt know that Dr Acie Fredrickson is not going to be coming back to [place] anymore and if pt would like to keep with him he will have to go [place]. Unless he wants to see another provider in [place]. He just needs a 75m fu

## 2015-04-10 ENCOUNTER — Other Ambulatory Visit: Payer: Self-pay | Admitting: Family Medicine

## 2015-04-10 ENCOUNTER — Ambulatory Visit: Payer: Medicare PPO | Admitting: Family Medicine

## 2015-04-10 NOTE — Telephone Encounter (Signed)
PT IS OUT OF HIS BLOOD PRESSURE MEDICATION. HE FORGOT HIS APPT TODAY BUT  HAS MADE ONE FOR NEXT Friday THE 23RD. COULD YOU PLEASE REFILL THIS ONE TIME.

## 2015-04-10 NOTE — Telephone Encounter (Signed)
Patient requesting refill. 

## 2015-04-20 ENCOUNTER — Encounter: Payer: Self-pay | Admitting: Family Medicine

## 2015-04-20 ENCOUNTER — Ambulatory Visit (INDEPENDENT_AMBULATORY_CARE_PROVIDER_SITE_OTHER): Payer: Medicare PPO | Admitting: Family Medicine

## 2015-04-20 VITALS — BP 124/68 | HR 78 | Temp 97.6°F | Resp 18 | Ht 71.0 in | Wt 185.6 lb

## 2015-04-20 DIAGNOSIS — T148 Other injury of unspecified body region: Secondary | ICD-10-CM | POA: Diagnosis not present

## 2015-04-20 DIAGNOSIS — I872 Venous insufficiency (chronic) (peripheral): Secondary | ICD-10-CM

## 2015-04-20 DIAGNOSIS — G4733 Obstructive sleep apnea (adult) (pediatric): Secondary | ICD-10-CM | POA: Diagnosis not present

## 2015-04-20 DIAGNOSIS — I480 Paroxysmal atrial fibrillation: Secondary | ICD-10-CM

## 2015-04-20 DIAGNOSIS — I5022 Chronic systolic (congestive) heart failure: Secondary | ICD-10-CM | POA: Diagnosis not present

## 2015-04-20 DIAGNOSIS — T148XXA Other injury of unspecified body region, initial encounter: Secondary | ICD-10-CM

## 2015-04-20 DIAGNOSIS — E7800 Pure hypercholesterolemia, unspecified: Secondary | ICD-10-CM

## 2015-04-20 DIAGNOSIS — J302 Other seasonal allergic rhinitis: Secondary | ICD-10-CM | POA: Diagnosis not present

## 2015-04-20 MED ORDER — LISINOPRIL 5 MG PO TABS: ORAL_TABLET | ORAL | Status: DC

## 2015-04-20 NOTE — Progress Notes (Signed)
Name: Steven Griffin   MRN: [id number]    DOB: 26-Mar-1946   Date:04/20/2015       Progress Note  Subjective  Chief Complaint  Chief Complaint  Patient presents with  . Medication Refill    3 month F/U  . Allergic Rhinitis     HPI  Paroxysmal Afib: sees Dr. Cathie Olden and is currently on aspirin 81 mg, he has prescription of Pradaxa at home but does not want to take it. Taking Tykosin, but only has Cardizem to take prn if he goes in afib. He has been asymptomatic for months. No palpitation, no chest pain  Chronic Systolic mild: taking lisinopril, no hypotension, denies SOB with activity. No orthopnea or PND.   OSA: had sleep study but does not need to use CPAP machine, no snoring, he denies waking up with a headache  Hyperlipidemia: refuses statin only on red yeast rice, he denies muscle aches.   AR: he states he has been doing well, taking medication prn, no sneezing, nasal congestion or rhinorrhea   Patient Active Problem List   Diagnosis Date Noted  . Muscle tear 04/20/2015  . Chronic venous insufficiency 10/27/2014  . Intermittent tremor 10/27/2014  . ED (erectile dysfunction) of non-organic origin 10/27/2014  . Colon, diverticulosis 10/27/2014  . Benign prostatic hypertrophy without urinary obstruction 10/27/2014  . Systolic CHF, chronic ([place]) 08/08/2013  . Bundle branch block, right and left anterior fascicular 06/07/2013  . Atrial fibrillation ([place]) 10/20/2012  . Obstructive apnea 03/23/2012  . Polypharmacy 04/15/2011  . Hypercholesteremia 01/18/2011  . H/O squamous cell carcinoma of skin 01/18/2011  . History of major vascular surgery 01/18/2011    Past Surgical History  Procedure Laterality Date  . Vein ligation and stripping    . Tonsillectomy and adenoidectomy    . Rib cartilage      benign tumor  . Skin cancer excision Right 06/2010    lower leg  . Colonoscopy with propofol N/A 11/20/2014    Procedure: COLONOSCOPY WITH PROPOFOL;  Surgeon: Manya Silvas,  MD;  Location: [facility] ENDOSCOPY;  Service: Endoscopy;  Laterality: N/A;    Family History  Problem Relation Age of Onset  . Leukemia Father   . Hypercholesterolemia Father   . Suicidality Mother   . Colon cancer Maternal Grandmother   . Hypertension Brother   . Hyperlipidemia Brother   . Cirrhosis Brother   . Alcoholism Brother     Social History   Social History  . Marital Status: Married    Spouse Name: Neoma Laming  . Number of Children: 2  . Years of Education: 14   Occupational History  . Product manager    Social History Main Topics  . Smoking status: Current Every Day Smoker -- 1.00 packs/day for 30 years    Types: Cigarettes, Cigars  . Smokeless tobacco: Not on file  . Alcohol Use: 0.6 oz/week    1 Glasses of wine per week     Comment: 2-3 glasses of wine at night  . Drug Use: No  . Sexual Activity: Yes   Other Topics Concern  . Not on file   Social History Narrative     Current outpatient prescriptions:  .  5-Hydroxytryptophan (5-HTP) 100 MG CAPS, Take by mouth., Disp: , Rfl:  .  ALPHA LIPOIC ACID PO, Take by mouth., Disp: , Rfl:  .  aspirin 325 MG tablet, Take 325 mg by mouth daily., Disp: , Rfl:  .  Cholecalciferol (VITAMIN D PO), Take 7,000 mg  by mouth daily., Disp: , Rfl:  .  Digestive Enzymes (DIGESTIVE ENZYME PO), Take by mouth 3 (three) times daily., Disp: , Rfl:  .  diltiazem (CARDIZEM) 30 MG tablet, Take 30 mg by mouth 4 (four) times daily as needed., Disp: , Rfl:  .  dofetilide (TIKOSYN) 125 MCG capsule, Takes 3 tablets twice daily., Disp: , Rfl:  .  fexofenadine (ALLEGRA) 180 MG tablet, Take 180 mg by mouth as needed. , Disp: , Rfl:  .  Flaxseed, Linseed, (FLAXSEED OIL) 1000 MG CAPS, Take by mouth., Disp: , Rfl:  .  fluticasone (FLONASE) 50 MCG/ACT nasal spray, Place 1 spray into the nose daily., Disp: , Rfl:  .  lisinopril (PRINIVIL,ZESTRIL) 5 MG tablet, TAKE (1) TABLET BY MOUTH EVERY DAY, Disp: 90 tablet, Rfl: 4 .  MAGNESIUM-POTASSIUM PO,  Take 2 tablets by mouth daily., Disp: , Rfl:  .  Multiple Vitamin (MULTIVITAMIN) tablet, Take 1 tablet by mouth daily., Disp: , Rfl:  .  NON FORMULARY, AMPK Takes 2 tablets in the am and 1 tablet in the pm., Disp: , Rfl:  .  NON FORMULARY, Fucoxothin Takes 1 tablet after each meal daily., Disp: , Rfl:  .  NON FORMULARY, Irvingia Takes 2 tablets daily., Disp: , Rfl:  .  NON FORMULARY, Super Micro Forte Takes 2 tablets by mouth BID as needed., Disp: , Rfl:  .  Omega-3 Fatty Acids (FISH OIL) 1000 MG CAPS, Take by mouth daily., Disp: , Rfl:  .  Probiotic Product (PROBIOTIC DAILY PO), Take by mouth daily., Disp: , Rfl:  .  RED YEAST RICE EXTRACT PO, Take by mouth., Disp: , Rfl:  .  tamsulosin (FLOMAX) 0.4 MG CAPS capsule, TAKE (1) CAPSULE BY MOUTH EVERY DAY, Disp: 30 capsule, Rfl: 0  No Known Allergies   ROS  Constitutional: Negative for fever or weight change.  Respiratory: Negative for cough and shortness of breath.   Cardiovascular: Negative for chest pain or palpitations.  Gastrointestinal: Negative for abdominal pain, no bowel changes.  Musculoskeletal: Negative for gait problem or joint swelling.  Skin: Negative for rash.  Neurological: Negative for dizziness or headache.  No other specific complaints in a complete review of systems (except as listed in HPI above).  Objective  Filed Vitals:   04/20/15 1419  BP: 124/68  Pulse: 78  Temp: 97.6 F (36.4 C)  TempSrc: Oral  Resp: 18  Height: 5\' 11"  (1.803 m)  Weight: 185 lb 9.6 oz (84.188 kg)  SpO2: 95%    Body mass index is 25.9 kg/(m^2).  Physical Exam  Constitutional: Patient appears well-developed and well-nourished.  No distress.  HEENT: head atraumatic, normocephalic, pupils equal and reactive to light,  neck supple, throat within normal limits Cardiovascular: Normal rate, regular rhythm and normal heart sounds.  No murmur heard. No BLE edema. Pulmonary/Chest: Effort normal and breath sounds normal. No respiratory  distress. Abdominal: Soft.  There is no tenderness. Psychiatric: Patient has a normal mood and affect. behavior is normal. Judgment and thought content normal.   PHQ2/9: Depression screen [facility] [place] 2/9 04/20/2015 10/27/2014  Decreased Interest 0 0  Down, Depressed, Hopeless 0 0  PHQ - 2 Score 0 0    Fall Risk: Fall Risk  04/20/2015 10/27/2014  Falls in the past year? No No    Functional Status Survey: Is the patient deaf or have difficulty hearing?: No Does the patient have difficulty seeing, even when wearing glasses/contacts?: Yes (reading glasses) Does the patient have difficulty concentrating, remembering, or making decisions?:  No Does the patient have difficulty walking or climbing stairs?: No Does the patient have difficulty dressing or bathing?: No Does the patient have difficulty doing errands alone such as visiting a doctor's office or shopping?: No    Assessment & Plan  1. Paroxysmal atrial fibrillation (HCC)  Discussed importance of pradaxa, or to increase aspirin to 325 mg   2. Hypercholesteremia  On red yeast rice only, does not want to take prescription medication   3. Obstructive apnea  He states he does not need to use CPAP  4. Chronic venous insufficiency  Doing well at this time, seen by vascular surgery   5. Systolic CHF, chronic (HCC)  - lisinopril (PRINIVIL,ZESTRIL) 5 MG tablet; TAKE (1) TABLET BY MOUTH EVERY DAY  Dispense: 90 tablet; Refill: 4  6. Muscle tear  Doing well , seen by Ortho   7. Allergic rhinitis, seasonal  Continue prn Allegra

## 2015-05-10 ENCOUNTER — Other Ambulatory Visit: Payer: Self-pay | Admitting: Family Medicine

## 2015-05-10 DIAGNOSIS — I5022 Chronic systolic (congestive) heart failure: Secondary | ICD-10-CM

## 2015-05-10 MED ORDER — LISINOPRIL 5 MG PO TABS: ORAL_TABLET | ORAL | Status: DC

## 2015-05-10 NOTE — Telephone Encounter (Signed)
Refill request was sent to Dr. Krichna Sowles for approval and submission.  

## 2015-05-10 NOTE — Telephone Encounter (Signed)
Requesting refill on lisinopril. Please send to [facility]. Patient has one pill left Requesting return call once complete.

## 2015-05-11 ENCOUNTER — Other Ambulatory Visit: Payer: Self-pay

## 2015-05-11 DIAGNOSIS — I5022 Chronic systolic (congestive) heart failure: Secondary | ICD-10-CM

## 2015-05-11 MED ORDER — LISINOPRIL 5 MG PO TABS: ORAL_TABLET | ORAL | Status: DC

## 2015-05-11 NOTE — Telephone Encounter (Signed)
Patient requesting refill. 

## 2015-05-28 ENCOUNTER — Encounter: Payer: Self-pay | Admitting: Cardiovascular Disease

## 2015-05-28 ENCOUNTER — Ambulatory Visit (INDEPENDENT_AMBULATORY_CARE_PROVIDER_SITE_OTHER): Payer: Medicare PPO | Admitting: Cardiovascular Disease

## 2015-05-28 VITALS — BP 112/74 | HR 56 | Ht 71.0 in | Wt 178.0 lb

## 2015-05-28 DIAGNOSIS — I480 Paroxysmal atrial fibrillation: Secondary | ICD-10-CM | POA: Diagnosis not present

## 2015-05-28 NOTE — Progress Notes (Signed)
HPI  This is a 70 year old male who is here today for a follow-up visit. He use to see Dr. Acie Fredrickson. He also follows up at [facility] cardiology. He has known history of bradycardia, squamous cell carcinoma of the skin, near syncope, obstructive sleep apnea, and history of persistent atrial fibrillation, currently on dofetilide (Tikosyn) therapy.  He has been on dofetilide since 2008 with good control. He has no history of congestive heart failure or stroke. Most recent echocardiogram in 2015 showed normal LV systolic function and no significant valvular abnormalities. He smokes one and a half pack per day but he does not inhale and only smokes organic tobacco. He has been doing very well and denies any chest pain, shortness of breath or significant palpitations. According to the most recent note from [facility] cardiology, he is supposed to be on Pradaxa  but he is only taking aspirin 81 mg daily and is not in favor of starting anticoagulation at the present time.   No Known Allergies   Current Outpatient Prescriptions on File Prior to Visit  Medication Sig Dispense Refill  . 5-Hydroxytryptophan (5-HTP) 100 MG CAPS Take by mouth.    . ALPHA LIPOIC ACID PO Take by mouth.    . Cholecalciferol (VITAMIN D PO) Take 7,000 mg by mouth daily.    . Digestive Enzymes (DIGESTIVE ENZYME PO) Take by mouth 3 (three) times daily.    Marland Kitchen diltiazem (CARDIZEM) 30 MG tablet Take 30 mg by mouth 4 (four) times daily as needed.    . dofetilide (TIKOSYN) 125 MCG capsule Takes 3 tablets twice daily.    . fexofenadine (ALLEGRA) 180 MG tablet Take 180 mg by mouth as needed.     Marland Kitchen lisinopril (PRINIVIL,ZESTRIL) 5 MG tablet TAKE (1) TABLET BY MOUTH EVERY DAY 90 tablet 1  . MAGNESIUM-POTASSIUM PO Take 2 tablets by mouth daily.    . Multiple Vitamin (MULTIVITAMIN) tablet Take 1 tablet by mouth daily.    . NON FORMULARY AMPK Takes 2 tablets in the am and 1 tablet in the pm.    . NON FORMULARY Fucoxothin Takes 1 tablet after each meal  daily.    . NON FORMULARY Irvingia Takes 2 tablets daily.    . Omega-3 Fatty Acids (FISH OIL) 1000 MG CAPS Take by mouth daily.    . Probiotic Product (PROBIOTIC DAILY PO) Take by mouth daily.    . RED YEAST RICE EXTRACT PO Take by mouth.    . tamsulosin (FLOMAX) 0.4 MG CAPS capsule TAKE (1) CAPSULE BY MOUTH EVERY DAY 30 capsule 5  . Flaxseed, Linseed, (FLAXSEED OIL) 1000 MG CAPS Take by mouth.    . NON FORMULARY Super Micro Forte Takes 2 tablets by mouth BID as needed.     No current facility-administered medications on file prior to visit.     Past Medical History  Diagnosis Date  . A-fib ([place])   . Mild anxiety   . Benign tumor     left axilla  . Impotence of organic origin   . COPD (chronic obstructive pulmonary disease) ([place])   . Atrial fibrillation ([place])   . Special screening for malignant neoplasm of prostate   . Hyperlipidemia   . Essential hypertension, benign   . Other abnormal glucose   . Diverticulosis of colon (without mention of hemorrhage)   . Allergic rhinitis, cause unspecified   . Unspecified venous (peripheral) insufficiency   . Cancer [facility])      Past Surgical History  Procedure Laterality Date  .  Vein ligation and stripping    . Tonsillectomy and adenoidectomy    . Rib cartilage      benign tumor  . Skin cancer excision Right 06/2010    lower leg  . Colonoscopy with propofol N/A 11/20/2014    Procedure: COLONOSCOPY WITH PROPOFOL;  Surgeon: Manya Silvas, MD;  Location: [facility] ENDOSCOPY;  Service: Endoscopy;  Laterality: N/A;     Family History  Problem Relation Age of Onset  . Leukemia Father   . Hypercholesterolemia Father   . Suicidality Mother   . Colon cancer Maternal Grandmother   . Hypertension Brother   . Hyperlipidemia Brother   . Cirrhosis Brother   . Alcoholism Brother      Social History   Social History  . Marital Status: Married    Spouse Name: Neoma Laming  . Number of Children: 2  . Years of Education: 14   Occupational  History  . Product manager    Social History Main Topics  . Smoking status: Current Every Day Smoker -- 1.00 packs/day for 30 years    Types: Cigarettes, Cigars  . Smokeless tobacco: Not on file  . Alcohol Use: 0.6 oz/week    1 Glasses of wine per week     Comment: 2-3 glasses of wine at night  . Drug Use: No  . Sexual Activity: Yes   Other Topics Concern  . Not on file   Social History Narrative       PHYSICAL EXAM   BP 112/74 mmHg  Pulse 56  Ht 5\' 11"  (1.803 m)  Wt 178 lb (80.74 kg)  BMI 24.84 kg/m2 Constitutional: He is oriented to person, place, and time. He appears well-developed and well-nourished. No distress.  HENT: No nasal discharge.  Head: Normocephalic and atraumatic.  Eyes: Pupils are equal and round.  No discharge. Neck: Normal range of motion. Neck supple. No JVD present. No thyromegaly present.  Cardiovascular: Normal rate, regular rhythm, normal heart sounds. Exam reveals no gallop and no friction rub. No murmur heard.  Pulmonary/Chest: Effort normal and breath sounds normal. No stridor. No respiratory distress. He has no wheezes. He has no rales. He exhibits no tenderness.  Abdominal: Soft. Bowel sounds are normal. He exhibits no distension. There is no tenderness. There is no rebound and no guarding.  Musculoskeletal: Normal range of motion. He exhibits no edema and no tenderness.  Neurological: He is alert and oriented to person, place, and time. Coordination normal.  Skin: Skin is warm and dry. No rash noted. He is not diaphoretic. No erythema. No pallor.  Psychiatric: He has a normal mood and affect. His behavior is normal. Judgment and thought content normal.       EKG: sinus bradycardia with right bundle branch block and left anterior fascicular block. QTC is 424 ms.    ASSESSMENT AND PLAN

## 2015-05-28 NOTE — Patient Instructions (Signed)
Medication Instructions: Continue same medications.   Labwork: None.   Procedures/Testing: None.   Follow-Up: 6 months with Dr. Guerry Covington.   Any Additional Special Instructions Will Be Listed Below (If Applicable).     If you need a refill on your cardiac medications before your next appointment, please call your pharmacy.   

## 2015-05-28 NOTE — Assessment & Plan Note (Signed)
The patient is maintaining in sinus rhythm with no significant breakthrough atrial fibrillation on current dose of dofetilide. He had labs done late last year which overall were unremarkable with normal kidney function. QTC is normal. CHADS VASc score is 1.   He is not on anticoagulation at the present time. That might be reasonable at the present time given his relatively low thromboembolic risk.

## 2015-08-29 ENCOUNTER — Encounter: Payer: Medicare PPO | Admitting: Family Medicine

## 2015-08-31 ENCOUNTER — Encounter: Payer: Self-pay | Admitting: Family Medicine

## 2015-08-31 ENCOUNTER — Ambulatory Visit (INDEPENDENT_AMBULATORY_CARE_PROVIDER_SITE_OTHER): Payer: Medicare PPO | Admitting: Family Medicine

## 2015-08-31 ENCOUNTER — Encounter: Payer: Medicare PPO | Admitting: Family Medicine

## 2015-08-31 VITALS — BP 110/72 | HR 58 | Temp 97.7°F | Resp 12 | Ht 71.0 in | Wt 175.3 lb

## 2015-08-31 DIAGNOSIS — I5022 Chronic systolic (congestive) heart failure: Secondary | ICD-10-CM | POA: Diagnosis not present

## 2015-08-31 DIAGNOSIS — I480 Paroxysmal atrial fibrillation: Secondary | ICD-10-CM

## 2015-08-31 DIAGNOSIS — Z131 Encounter for screening for diabetes mellitus: Secondary | ICD-10-CM

## 2015-08-31 DIAGNOSIS — Z125 Encounter for screening for malignant neoplasm of prostate: Secondary | ICD-10-CM

## 2015-08-31 DIAGNOSIS — R001 Bradycardia, unspecified: Secondary | ICD-10-CM | POA: Diagnosis not present

## 2015-08-31 DIAGNOSIS — Z1159 Encounter for screening for other viral diseases: Secondary | ICD-10-CM | POA: Diagnosis not present

## 2015-08-31 DIAGNOSIS — E559 Vitamin D deficiency, unspecified: Secondary | ICD-10-CM | POA: Diagnosis not present

## 2015-08-31 DIAGNOSIS — Z79899 Other long term (current) drug therapy: Secondary | ICD-10-CM

## 2015-08-31 DIAGNOSIS — Z0000 Encounter for general adult medical examination without abnormal findings: Secondary | ICD-10-CM | POA: Diagnosis not present

## 2015-08-31 DIAGNOSIS — E7800 Pure hypercholesterolemia, unspecified: Secondary | ICD-10-CM | POA: Diagnosis not present

## 2015-08-31 NOTE — Progress Notes (Signed)
Name: Steven Griffin   MRN: [id number]    DOB: 1946-03-04   Date:08/31/2015       Progress Note  Subjective  Chief Complaint  Chief Complaint  Patient presents with  . Annual Exam    HPI  Functional ability/safety issues: No Issues Hearing issues: Addressed - sometimes he has cerumen impacted Activities of daily living: Discussed Home safety issues: No Issues  End Of Life Planning: Offered verbal information regarding advanced directives, healthcare power of attorney ( son )  Preventative care, Health maintenance, Preventative health measures discussed.  Preventative screenings discussed today: lab work, colonoscopy, PSA.  Men age 60 to 31 years if ever smoked recommended to get a one time AAA ultrasound screening exam ( he wants to hold off for now - he thinks he had it done at cardiologist).  Low Dose CT Chest recommended if Age 3-80 years, 30 pack-year currently smoking OR have quit w/in 15years. Discussed it with him, he would like to hold off for now.  Lifestyle risk factor issued reviewed: Diet, exercise, weight management, advised patient smoking is not healthy, nutrition/diet.  Preventative health measures discussed (5-10 year plan).  Reviewed and recommended vaccinations: - Pneumovax  - Prevnar  - Annual Influenza - Zostavax - Tdap   Depression screening: Done Fall risk screening: Done Discuss ADLs/IADLs: Done  Current medical providers: See HPI  Other health risk factors identified this visit: No other issues Cognitive impairment issues: None identified  All above discussed with patient. Appropriate education, counseling and referral will be made based upon the above.    Afib: he is doing well, sees cardiologist, no palpitation no SOB, he is active and has not problems. Compliant with medication  Chronic Systolic CHF: he is doing well, he has no orthopnea, he denies any SOB. No PND  OSA: mild and he did not qualify for CPAP machine.   Hyperlipidemia:  on Red Yeast Rice only, afraid of statin therapy.   Patient Active Problem List   Diagnosis Date Noted  . Muscle tear 04/20/2015  . Allergic rhinitis, seasonal 04/20/2015  . Chronic venous insufficiency 10/27/2014  . Intermittent tremor 10/27/2014  . ED (erectile dysfunction) of non-organic origin 10/27/2014  . Colon, diverticulosis 10/27/2014  . Benign prostatic hypertrophy without urinary obstruction 10/27/2014  . Systolic CHF, chronic ([place]) 08/08/2013  . Bundle branch block, right and left anterior fascicular 06/07/2013  . Atrial fibrillation ([place]) 10/20/2012  . Obstructive apnea 03/23/2012  . Polypharmacy 04/15/2011  . Hypercholesteremia 01/18/2011  . H/O squamous cell carcinoma of skin 01/18/2011  . History of major vascular surgery 01/18/2011    Past Surgical History  Procedure Laterality Date  . Vein ligation and stripping    . Tonsillectomy and adenoidectomy    . Rib cartilage      benign tumor  . Skin cancer excision Right 06/2010    lower leg  . Colonoscopy with propofol N/A 11/20/2014    Procedure: COLONOSCOPY WITH PROPOFOL;  Surgeon: Manya Silvas, MD;  Location: [facility] ENDOSCOPY;  Service: Endoscopy;  Laterality: N/A;    Family History  Problem Relation Age of Onset  . Leukemia Father   . Hypercholesterolemia Father   . Suicidality Mother   . Colon cancer Maternal Grandmother   . Hypertension Brother   . Hyperlipidemia Brother   . Cirrhosis Brother   . Alcoholism Brother     Social History   Social History  . Marital Status: Married    Spouse Name: Neoma Laming  . Number of Children:  2  . Years of Education: 14   Occupational History  . Product manager    Social History Main Topics  . Smoking status: Current Every Day Smoker -- 1.00 packs/day for 30 years    Types: Cigarettes, Cigars  . Smokeless tobacco: Not on file  . Alcohol Use: 0.6 oz/week    1 Glasses of wine per week     Comment: 2-3 glasses of wine at night  . Drug Use: No  .  Sexual Activity: Yes   Other Topics Concern  . Not on file   Social History Narrative     Current outpatient prescriptions:  .  5-Hydroxytryptophan (5-HTP) 100 MG CAPS, Take by mouth., Disp: , Rfl:  .  ALPHA LIPOIC ACID PO, Take by mouth., Disp: , Rfl:  .  aspirin 81 MG tablet, Take 162 mg by mouth daily., Disp: , Rfl:  .  Cholecalciferol (VITAMIN D PO), Take 7,000 mg by mouth daily., Disp: , Rfl:  .  Digestive Enzymes (DIGESTIVE ENZYME PO), Take by mouth 3 (three) times daily., Disp: , Rfl:  .  diltiazem (CARDIZEM) 30 MG tablet, Take 30 mg by mouth 4 (four) times daily as needed., Disp: , Rfl:  .  dofetilide (TIKOSYN) 125 MCG capsule, Takes 3 tablets twice daily., Disp: , Rfl:  .  fexofenadine (ALLEGRA) 180 MG tablet, Take 180 mg by mouth as needed. , Disp: , Rfl:  .  Flaxseed, Linseed, (FLAXSEED OIL) 1000 MG CAPS, Take by mouth., Disp: , Rfl:  .  lisinopril (PRINIVIL,ZESTRIL) 5 MG tablet, TAKE (1) TABLET BY MOUTH EVERY DAY, Disp: 90 tablet, Rfl: 1 .  MAGNESIUM-POTASSIUM PO, Take 2 tablets by mouth daily., Disp: , Rfl:  .  Multiple Vitamin (MULTIVITAMIN) tablet, Take 1 tablet by mouth daily., Disp: , Rfl:  .  NON FORMULARY, AMPK Takes 2 tablets in the am and 1 tablet in the pm., Disp: , Rfl:  .  NON FORMULARY, Fucoxothin Takes 1 tablet after each meal daily., Disp: , Rfl:  .  NON FORMULARY, Irvingia Takes 2 tablets daily., Disp: , Rfl:  .  NON FORMULARY, Super Micro Forte Takes 2 tablets by mouth BID as needed., Disp: , Rfl:  .  Omega-3 Fatty Acids (FISH OIL) 1000 MG CAPS, Take by mouth daily., Disp: , Rfl:  .  Probiotic Product (PROBIOTIC DAILY PO), Take by mouth daily., Disp: , Rfl:  .  RED YEAST RICE EXTRACT PO, Take by mouth., Disp: , Rfl:  .  tamsulosin (FLOMAX) 0.4 MG CAPS capsule, TAKE (1) CAPSULE BY MOUTH EVERY DAY, Disp: 30 capsule, Rfl: 5  No Known Allergies   ROS  Constitutional: Negative for fever or weight change.  Respiratory: Negative for cough and shortness of  breath.   Cardiovascular: Negative for chest pain or palpitations.  Gastrointestinal: Negative for abdominal pain, no bowel changes.  Musculoskeletal: Negative for gait problem or joint swelling.  Skin: Negative for rash.  Neurological: Negative for dizziness or headache.  No other specific complaints in a complete review of systems (except as listed in HPI above).  Objective  Filed Vitals:   08/31/15 0746  BP: 110/72  Pulse: 58  Temp: 97.7 F (36.5 C)  TempSrc: Oral  Resp: 12  Height: 5\' 11"  (1.803 m)  Weight: 175 lb 4.8 oz (79.516 kg)  SpO2: 95%    Body mass index is 24.46 kg/(m^2).  Physical Exam  Constitutional: Patient appears well-developed and well-nourished. No distress.  HENT: Head: Normocephalic and atraumatic. Ears:cerumen both ear  canals   no erythema or effusion; Nose: Nose normal. Mouth/Throat: Oropharynx is clear and moist. No oropharyngeal exudate.  Eyes: Conjunctivae and EOM are normal. Pupils are equal, round, and reactive to light. No scleral icterus.  Neck: Normal range of motion. Neck supple. No JVD present. No thyromegaly present.  Cardiovascular: brachycardia, regular rhythm  and normal heart sounds.  No murmur heard. No BLE edema. Pulmonary/Chest: Effort normal and breath sounds normal. No respiratory distress. Abdominal: Soft. Bowel sounds are normal, no distension. There is no tenderness. no masses MALE GENITALIA: Normal descended testes bilaterally, no masses palpated, no hernias, no lesions, no discharge RECTAL: Prostate slightly enlarged in  size and consistency, no rectal masses or hemorrhoids Musculoskeletal: Normal range of motion, no joint effusions. No gross deformities Neurological: he is alert and oriented to person, place, and time. No cranial nerve deficit. Coordination, balance, strength, speech and gait are normal.  Skin: Skin is warm and dry. No rash noted. No erythema.  Psychiatric: Patient has a normal mood and affect. behavior is  normal. Judgment and thought content normal.   PHQ2/9: Depression screen [facility] 2/9 08/31/2015 04/20/2015 10/27/2014  Decreased Interest 0 0 0  Down, Depressed, Hopeless 0 0 0  PHQ - 2 Score 0 0 0    Fall Risk: Fall Risk  08/31/2015 04/20/2015 10/27/2014  Falls in the past year? No No No   IPSS Questionnaire (AUA-7): Over the past month.   1)  How often have you had a sensation of not emptying your bladder completely after you finish urinating?  4 - More than half the time  2)  How often have you had to urinate again less than two hours after you finished urinating? 3 - About half the time  3)  How often have you found you stopped and started again several times when you urinated?  1 - Less than 1 time in 5  4) How difficult have you found it to postpone urination?  2 - Less than half the time  5) How often have you had a weak urinary stream?  2 - Less than half the time  6) How often have you had to push or strain to begin urination?  1 - Less than 1 time in 5  7) How many times did you most typically get up to urinate from the time you went to bed until the time you got up in the morning?   1- Less than 1  Time in 5  Total score:  0-7 mildly symptomatic  14 8-19 moderately symptomatic   20-35 severely symptomatic    Functional Status Survey: Is the patient deaf or have difficulty hearing?: No Does the patient have difficulty seeing, even when wearing glasses/contacts?: No Does the patient have difficulty concentrating, remembering, or making decisions?: No Does the patient have difficulty walking or climbing stairs?: No Does the patient have difficulty dressing or bathing?: No Does the patient have difficulty doing errands alone such as visiting a doctor's office or shopping?: No    Assessment & Plan  1. Medicare annual wellness visit, subsequent  Discussed importance of 150 minutes of physical activity weekly, eat two servings of fish weekly, eat one serving of tree nuts (  cashews, pistachios, pecans, almonds.Marland Kitchen) every other day, eat 6 servings of fruit/vegetables daily and drink plenty of water and avoid sweet beverages.  - CBC with Differential/Platelet  2. Prostate cancer screening  - PSA  3. Long-term use of high-risk medication  - Comprehensive metabolic panel  4. Hypercholesteremia  - Lipid panel  5. Paroxysmal atrial fibrillation (HCC)  - C-reactive protein  6. Systolic CHF, chronic (HCC)  - C-reactive protein  7. DM (diabetes mellitus screen)  - Hemoglobin A1c  8. Vitamin D deficiency  - VITAMIN D 25 Hydroxy (Vit-D Deficiency, Fractures)  9. Need for hepatitis C screening test  - Hepatitis C antibody   10. Bradycardia  He states usually slow, and no symptoms.

## 2015-08-31 NOTE — Patient Instructions (Signed)
  Steven Griffin ,  Thank you for taking time to come for your Medicare Wellness Visit. I appreciate your ongoing commitment to your health goals. Please review the following plan we discussed and let me know if I can assist you in the future.   These are the goals we discussed:  Check about CT low dose for lung cancer screen Check with cardiologist if you had a AAA screen   This is a list of the screening recommended for you and due dates:  Health Maintenance  Topic Date Due  .  Hepatitis C: One time screening is recommended by Center for Disease Control  (CDC) for  adults born from [birth year] through 1964.   1945-07-24  . Flu Shot  11/27/2015  . Tetanus Vaccine  06/16/2021  . Colon Cancer Screening  11/19/2024  . Shingles Vaccine  Completed  . Pneumonia vaccines  Completed

## 2015-09-01 LAB — HEMOGLOBIN A1C
Est. average glucose Bld gHb Est-mCnc: 111 mg/dL
Hgb A1c MFr Bld: 5.5 % (ref 4.8–5.6)

## 2015-09-01 LAB — COMPREHENSIVE METABOLIC PANEL
A/G RATIO: 2.4 — AB (ref 1.2–2.2)
ALBUMIN: 4.5 g/dL (ref 3.6–4.8)
ALT: 20 IU/L (ref 0–44)
AST: 19 IU/L (ref 0–40)
Alkaline Phosphatase: 69 IU/L (ref 39–117)
BILIRUBIN TOTAL: 0.4 mg/dL (ref 0.0–1.2)
BUN / CREAT RATIO: 17 (ref 10–24)
BUN: 15 mg/dL (ref 8–27)
CALCIUM: 9.4 mg/dL (ref 8.6–10.2)
CHLORIDE: 105 mmol/L (ref 96–106)
CO2: 26 mmol/L (ref 18–29)
Creatinine, Ser: 0.89 mg/dL (ref 0.76–1.27)
GFR, EST AFRICAN AMERICAN: 101 mL/min/{1.73_m2} (ref >59)
GFR, EST NON AFRICAN AMERICAN: 87 mL/min/{1.73_m2} (ref >59)
GLOBULIN, TOTAL: 1.9 g/dL (ref 1.5–4.5)
Glucose: 107 mg/dL — ABNORMAL HIGH (ref 65–99)
POTASSIUM: 5 mmol/L (ref 3.5–5.2)
SODIUM: 142 mmol/L (ref 134–144)
TOTAL PROTEIN: 6.4 g/dL (ref 6.0–8.5)

## 2015-09-01 LAB — CBC WITH DIFFERENTIAL/PLATELET
BASOS ABS: 0.1 10*3/uL (ref 0.0–0.2)
BASOS: 1 %
EOS (ABSOLUTE): 0.8 10*3/uL — AB (ref 0.0–0.4)
EOS: 14 %
Hematocrit: 46.3 % (ref 37.5–51.0)
Hemoglobin: 15.7 g/dL (ref 12.6–17.7)
IMMATURE GRANULOCYTES: 0 %
Immature Grans (Abs): 0 10*3/uL (ref 0.0–0.1)
LYMPHS: 20 %
Lymphocytes Absolute: 1.2 10*3/uL (ref 0.7–3.1)
MCH: 31.3 pg (ref 26.6–33.0)
MCHC: 33.9 g/dL (ref 31.5–35.7)
MCV: 92 fL (ref 79–97)
MONOS ABS: 0.4 10*3/uL (ref 0.1–0.9)
Monocytes: 8 %
Neutrophils Absolute: 3.4 10*3/uL (ref 1.4–7.0)
Neutrophils: 57 %
Platelets: 291 10*3/uL (ref 150–379)
RBC: 5.02 x10E6/uL (ref 4.14–5.80)
RDW: 13.4 % (ref 12.3–15.4)
WBC: 5.8 10*3/uL (ref 3.4–10.8)

## 2015-09-01 LAB — LIPID PANEL
Chol/HDL Ratio: 3 ratio units (ref 0.0–5.0)
Cholesterol, Total: 185 mg/dL (ref 100–199)
HDL: 61 mg/dL (ref >39)
LDL Calculated: 110 mg/dL — ABNORMAL HIGH (ref 0–99)
Triglycerides: 68 mg/dL (ref 0–149)
VLDL Cholesterol Cal: 14 mg/dL (ref 5–40)

## 2015-09-01 LAB — HEPATITIS C ANTIBODY: Hep C Virus Ab: 0.1 s/co ratio (ref 0.0–0.9)

## 2015-09-01 LAB — PSA: PROSTATE SPECIFIC AG, SERUM: 4.6 ng/mL — AB (ref 0.0–4.0)

## 2015-09-01 LAB — VITAMIN D 25 HYDROXY (VIT D DEFICIENCY, FRACTURES): Vit D, 25-Hydroxy: 58.8 ng/mL (ref 30.0–100.0)

## 2015-09-01 LAB — C-REACTIVE PROTEIN: CRP: 1 mg/L (ref 0.0–4.9)

## 2015-11-12 ENCOUNTER — Other Ambulatory Visit: Payer: Self-pay | Admitting: Family Medicine

## 2015-11-12 NOTE — Telephone Encounter (Signed)
Patient requesting refill. 

## 2015-12-24 ENCOUNTER — Ambulatory Visit (INDEPENDENT_AMBULATORY_CARE_PROVIDER_SITE_OTHER): Payer: Medicare PPO | Admitting: Cardiovascular Disease

## 2015-12-24 ENCOUNTER — Encounter: Payer: Self-pay | Admitting: Cardiovascular Disease

## 2015-12-24 VITALS — BP 110/62 | Ht 71.0 in | Wt 173.8 lb

## 2015-12-24 DIAGNOSIS — Z720 Tobacco use: Secondary | ICD-10-CM | POA: Diagnosis not present

## 2015-12-24 DIAGNOSIS — I481 Persistent atrial fibrillation: Secondary | ICD-10-CM

## 2015-12-24 DIAGNOSIS — I4819 Other persistent atrial fibrillation: Secondary | ICD-10-CM

## 2015-12-24 NOTE — Patient Instructions (Addendum)
Medication Instructions:  Your physician recommends that you continue on your current medications as directed. Please refer to the Current Medication list given to you today.   Labwork: none  Testing/Procedures: Your physician has requested that you have an echocardiogram. Echocardiography is a painless test that uses sound waves to create images of your heart. It provides your doctor with information about the size and shape of your heart and how well your heart's chambers and valves are working. This procedure takes approximately one hour. There are no restrictions for this procedure.  Appointment with Dr. Rayann Heman for afib ablation. ________________________  Follow-Up: Your physician wants you to follow-up in: six months with Dr. Fletcher Anon.  You will receive a reminder letter in the mail two months in advance. If you don't receive a letter, please call our office to schedule the follow-up appointment.   Any Other Special Instructions Will Be Listed Below (If Applicable).     If you need a refill on your cardiac medications before your next appointment, please call your pharmacy.  Echocardiogram An echocardiogram, or echocardiography, uses sound waves (ultrasound) to produce an image of your heart. The echocardiogram is simple, painless, obtained within a short period of time, and offers valuable information to your health care provider. The images from an echocardiogram can provide information such as:  Evidence of coronary artery disease (CAD).  Heart size.  Heart muscle function.  Heart valve function.  Aneurysm detection.  Evidence of a past heart attack.  Fluid buildup around the heart.  Heart muscle thickening.  Assess heart valve function. LET [facility] CARE PROVIDER KNOW ABOUT:  Any allergies you have.  All medicines you are taking, including vitamins, herbs, eye drops, creams, and over-the-counter medicines.  Previous problems you or members of your family have  had with the use of anesthetics.  Any blood disorders you have.  Previous surgeries you have had.  Medical conditions you have.  Possibility of pregnancy, if this applies. BEFORE THE PROCEDURE  No special preparation is needed. Eat and drink normally.  PROCEDURE   In order to produce an image of your heart, gel will be applied to your chest and a wand-like tool (transducer) will be moved over your chest. The gel will help transmit the sound waves from the transducer. The sound waves will harmlessly bounce off your heart to allow the heart images to be captured in real-time motion. These images will then be recorded.  You may need an IV to receive a medicine that improves the quality of the pictures. AFTER THE PROCEDURE You may return to your normal schedule including diet, activities, and medicines, unless your health care provider tells you otherwise.   This information is not intended to replace advice given to you by your health care provider. Make sure you discuss any questions you have with your health care provider.   Document Released: 04/11/2000 Document Revised: 05/05/2014 Document Reviewed: 12/20/2012 Elsevier Interactive Patient Education [employer].

## 2015-12-24 NOTE — Progress Notes (Signed)
Cardiology Office Note   Date:  12/24/2015   ID:  [name], DOB 1945-12-27, MRN [id number]  PCP:  Loistine Chance, MD  Cardiologist:   Kathlyn Sacramento, MD   No chief complaint on file.     History of Present Illness: Steven Griffin is a 70 y.o. male who presents for a follow-up visit regarding persistent atrial fibrillation currently on dofetilide. He follows up at [facility] cardiology as well. He has known history of bradycardia, squamous cell carcinoma of the skin, near syncope, obstructive sleep apnea, and history of persistent atrial fibrillation, currently on dofetilide (Tikosyn) therapy.  He has been on dofetilide since 2008 with good control. He has no history of congestive heart failure or stroke. Most recent echocardiogram in 2015 showed normal LV systolic function and no significant valvular abnormalities. He smokes one and a half pack per day but he does not inhale and only smokes organic tobacco. He has been doing very well and denies any chest pain, shortness of breath or significant palpitations.  He has been on anticoagulation briefly in the past but currently is only on aspirin. Catheter ablation has been discussed with him in the past but he has not pursued this. He seems to be more interested now. He has stable palpitations and denies any chest pain. He has mild exertional dyspnea with no recent worsening.  Past Medical History:  Diagnosis Date  . A-fib ([place])   . Allergic rhinitis, cause unspecified   . Atrial fibrillation ([place])   . Benign tumor    left axilla  . Cancer ([place])   . COPD (chronic obstructive pulmonary disease) ([place])   . Diverticulosis of colon (without mention of hemorrhage)   . Essential hypertension, benign   . Hyperlipidemia   . Impotence of organic origin   . Mild anxiety   . Other abnormal glucose   . Special screening for malignant neoplasm of prostate   . Unspecified venous (peripheral) insufficiency     Past Surgical History:    Procedure Laterality Date  . COLONOSCOPY WITH PROPOFOL N/A 11/20/2014   Procedure: COLONOSCOPY WITH PROPOFOL;  Surgeon: Manya Silvas, MD;  Location[facility] ENDOSCOPY;  Service: Endoscopy;  Laterality: N/A;  . rib cartilage     benign tumor  . SKIN CANCER EXCISION Right 06/2010   lower leg  . TONSILLECTOMY AND ADENOIDECTOMY    . VEIN LIGATION AND STRIPPING       Current Outpatient Prescriptions  Medication Sig Dispense Refill  . 5-Hydroxytryptophan (5-HTP) 100 MG CAPS Take by mouth.    . ALPHA LIPOIC ACID PO Take by mouth.    Marland Kitchen aspirin 81 MG tablet Take 162 mg by mouth daily.    . Cholecalciferol (VITAMIN D PO) Take 7,000 mg by mouth daily.    . Digestive Enzymes (DIGESTIVE ENZYME PO) Take by mouth 3 (three) times daily.    Marland Kitchen diltiazem (CARDIZEM) 30 MG tablet Take 30 mg by mouth 4 (four) times daily as needed.    . dofetilide (TIKOSYN) 125 MCG capsule Takes 3 tablets twice daily.    . fexofenadine (ALLEGRA) 180 MG tablet Take 180 mg by mouth as needed.     . Flaxseed, Linseed, (FLAXSEED OIL) 1000 MG CAPS Take by mouth.    Marland Kitchen lisinopril (PRINIVIL,ZESTRIL) 5 MG tablet TAKE (1) TABLET BY MOUTH EVERY DAY 90 tablet 1  . MAGNESIUM-POTASSIUM PO Take 2 tablets by mouth daily.    . Multiple Vitamin (MULTIVITAMIN) tablet Take 1 tablet by mouth  daily.    . NON FORMULARY AMPK Takes 2 tablets in the am and 1 tablet in the pm.    . NON FORMULARY Fucoxothin Takes 1 tablet after each meal daily.    . NON FORMULARY Irvingia Takes 2 tablets daily.    . NON FORMULARY Super Micro Forte Takes 2 tablets by mouth BID as needed.    . Omega-3 Fatty Acids (FISH OIL) 1000 MG CAPS Take by mouth daily.    . Prasterone, DHEA, 50 MG TABS Take 2 tablets by mouth.    . Probiotic Product (PROBIOTIC DAILY PO) Take by mouth daily.    . RED YEAST RICE EXTRACT PO Take by mouth.    . tamsulosin (FLOMAX) 0.4 MG CAPS capsule TAKE (1) CAPSULE BY MOUTH EVERY DAY 30 capsule 11   No current facility-administered medications  for this visit.     Allergies:   Review of patient's allergies indicates no known allergies.    Social History:  The patient  reports that he has been smoking Cigarettes and Cigars.  He has a 30.00 pack-year smoking history. He does not have any smokeless tobacco history on file. He reports that he drinks about 0.6 oz of alcohol per week . He reports that he does not use drugs.   Family History:  The patient's family history includes Alcoholism in his brother; Cirrhosis in his brother; Colon cancer in his maternal grandmother; Hypercholesterolemia in his father; Hyperlipidemia in his brother; Hypertension in his brother; Leukemia in his father; Suicidality in his mother.    ROS:  Please see the history of present illness.   Otherwise, review of systems are positive for none.   All other systems are reviewed and negative.    PHYSICAL EXAM: VS:  BP 110/62   Ht 5\' 11"  (1.803 m)   Wt 173 lb 12.8 oz (78.8 kg)   BMI 24.24 kg/m  , BMI Body mass index is 24.24 kg/m. GEN: Well nourished, well developed, in no acute distress  HEENT: normal  Neck: no JVD, carotid bruits, or masses Cardiac: RRR; no murmurs, rubs, or gallops,no edema  Respiratory:  clear to auscultation bilaterally, normal work of breathing GI: soft, nontender, nondistended, + BS MS: no deformity or atrophy  Skin: warm and dry, no rash Neuro:  Strength and sensation are intact Psych: euthymic mood, full affect   EKG:  EKG is ordered today. The ekg ordered today demonstrates sinus bradycardia with PACs. Right bundle branch block. Normal QT interval.   Recent Labs: 08/31/2015: ALT 20; BUN 15; Creatinine, Ser 0.89; Platelets 291; Potassium 5.0; Sodium 142    Lipid Panel    Component Value Date/Time   CHOL 185 08/31/2015 0839   TRIG 68 08/31/2015 0839   HDL 61 08/31/2015 0839   CHOLHDL 3.0 08/31/2015 0839   LDLCALC 110 (H) 08/31/2015 0839      Wt Readings from Last 3 Encounters:  12/24/15 173 lb 12.8 oz (78.8 kg)    08/31/15 175 lb 4.8 oz (79.5 kg)  05/28/15 178 lb (80.7 kg)       No flowsheet data found.    ASSESSMENT AND PLAN:  1.  Persistent atrial fibrillation: Currently maintaining in sinus rhythm with dofetilide with good control overall. The patient has failed other antiarrhythmic medications in the past including propafenone. The idea of catheter ablation has been discussed with the patient in the past at [facility] but he did not pursue. He seems to be more interested now but wants to discuss this in  our practice. Thus, I referred the patient EP for evaluation. I ordered an echocardiogram before that to evaluate for any structural heart abnormalities and to determine his atrial size. He did have previous cardiomyopathy but most recent ejection fraction was normal. I do think that with successful ablation, he might be able to come off dofetilide. I also discussed with him the issue that his CHADS VASc score is 1. He reports being on lisinopril not for hypertension. Even with a score of 1, the most recent guideline suggest anticoagulation for males with a score of 1. I discussed this with him and he wants to read about this.  2. Obstructive sleep apnea: According to his primary care physician, his sleep apnea was overall mild and did not qualify him for CPAP therapy.  3. Tobacco use: The patient continues to smoke. Smoking cessation was not discussed today.  Disposition:   FU with me in 6 months  Signed,  Kathlyn Sacramento, MD  12/24/2015 5:28 PM    [place]

## 2015-12-27 ENCOUNTER — Encounter: Payer: Self-pay | Admitting: Internal Medicine

## 2016-01-11 ENCOUNTER — Other Ambulatory Visit: Payer: Medicare PPO

## 2016-01-14 ENCOUNTER — Ambulatory Visit: Payer: Medicare PPO | Admitting: Internal Medicine

## 2016-01-18 ENCOUNTER — Ambulatory Visit (INDEPENDENT_AMBULATORY_CARE_PROVIDER_SITE_OTHER): Payer: Medicare PPO | Admitting: Family Medicine

## 2016-01-18 ENCOUNTER — Encounter: Payer: Self-pay | Admitting: Family Medicine

## 2016-01-18 VITALS — BP 110/72 | HR 66 | Temp 97.8°F | Resp 18 | Wt 173.6 lb

## 2016-01-18 DIAGNOSIS — I5022 Chronic systolic (congestive) heart failure: Secondary | ICD-10-CM | POA: Diagnosis not present

## 2016-01-18 DIAGNOSIS — N400 Enlarged prostate without lower urinary tract symptoms: Secondary | ICD-10-CM

## 2016-01-18 DIAGNOSIS — E559 Vitamin D deficiency, unspecified: Secondary | ICD-10-CM

## 2016-01-18 DIAGNOSIS — G4733 Obstructive sleep apnea (adult) (pediatric): Secondary | ICD-10-CM | POA: Diagnosis not present

## 2016-01-18 DIAGNOSIS — E7800 Pure hypercholesterolemia, unspecified: Secondary | ICD-10-CM

## 2016-01-18 DIAGNOSIS — I480 Paroxysmal atrial fibrillation: Secondary | ICD-10-CM | POA: Diagnosis not present

## 2016-01-18 DIAGNOSIS — M25562 Pain in left knee: Secondary | ICD-10-CM | POA: Diagnosis not present

## 2016-01-18 DIAGNOSIS — I8390 Asymptomatic varicose veins of unspecified lower extremity: Secondary | ICD-10-CM

## 2016-01-18 DIAGNOSIS — I8393 Asymptomatic varicose veins of bilateral lower extremities: Secondary | ICD-10-CM

## 2016-01-18 MED ORDER — TAMSULOSIN HCL 0.4 MG PO CAPS: 0.4000 mg | ORAL_CAPSULE | Freq: Every day | ORAL | 1 refills | Status: DC

## 2016-01-18 MED ORDER — ASPIRIN EC 325 MG PO TBEC: 325.0000 mg | DELAYED_RELEASE_TABLET | Freq: Every day | ORAL | 0 refills | Status: DC

## 2016-01-18 MED ORDER — LISINOPRIL 5 MG PO TABS: ORAL_TABLET | ORAL | 1 refills | Status: DC

## 2016-01-18 NOTE — Progress Notes (Signed)
Name: Steven Griffin   MRN: [id number]    DOB: Aug 26, 1945   Date:01/18/2016       Progress Note  Subjective  Chief Complaint  Chief Complaint  Patient presents with  . Follow-up    need referrel for pain     HPI   Left knee pain: he states that he changed his gold swing, and developed some fullness on the back of left knee and sometimes has a medial left knee pain. He denies effusion or redness.  Paroxysmal Afib: sees Dr. Fletcher Anon and also Dr. Omelia Blackwater and is currently on aspirin 325 mg, he refuses to take Pradaxa. Taking Tykosin, but only has Cardizem to take prn if he goes in afib. He has been asymptomatic for months. No palpitation, no chest pain. He is thinking about ablation, tired of taking Tykosin - because of the amount of pills and the cost.  Chronic Systolic mild: taking lisinopril, no hypotension, denies SOB with activity. No orthopnea or PND.   OSA: had sleep study but does not need to use CPAP machine, no snoring, he denies waking up with a headache, he is tired because he is busy, not because of lack of sleep  Hyperlipidemia: refuses statin only on red yeast rice, he denies muscle aches.   AR: he states he has been doing well, taking medication prn, no sneezing, nasal congestion or rhinorrhea   Patient Active Problem List   Diagnosis Date Noted  . Bradycardia 08/31/2015  . Muscle tear 04/20/2015  . Allergic rhinitis, seasonal 04/20/2015  . Chronic venous insufficiency 10/27/2014  . Intermittent tremor 10/27/2014  . ED (erectile dysfunction) of non-organic origin 10/27/2014  . Colon, diverticulosis 10/27/2014  . Benign prostatic hypertrophy without urinary obstruction 10/27/2014  . Systolic CHF, chronic ([place]) 08/08/2013  . Bundle branch block, right and left anterior fascicular 06/07/2013  . Atrial fibrillation ([place]) 10/20/2012  . Obstructive apnea 03/23/2012  . Polypharmacy 04/15/2011  . Hypercholesteremia 01/18/2011  . H/O squamous cell carcinoma of skin  01/18/2011  . History of major vascular surgery 01/18/2011    Past Surgical History:  Procedure Laterality Date  . COLONOSCOPY WITH PROPOFOL N/A 11/20/2014   Procedure: COLONOSCOPY WITH PROPOFOL;  Surgeon: Manya Silvas, MD;  Location: [facility] ENDOSCOPY;  Service: Endoscopy;  Laterality: N/A;  . rib cartilage     benign tumor  . SKIN CANCER EXCISION Right 06/2010   lower leg  . TONSILLECTOMY AND ADENOIDECTOMY    . VEIN LIGATION AND STRIPPING      Family History  Problem Relation Age of Onset  . Suicidality Mother   . Leukemia Father   . Hypercholesterolemia Father   . Colon cancer Maternal Grandmother   . Hypertension Brother   . Hyperlipidemia Brother   . Cirrhosis Brother   . Alcoholism Brother     Social History   Social History  . Marital status: Married    Spouse name: Neoma Laming  . Number of children: 2  . Years of education: 14   Occupational History  . Product manager    Social History Main Topics  . Smoking status: Current Every Day Smoker    Packs/day: 1.00    Years: 30.00    Types: Cigarettes, Cigars  . Smokeless tobacco: Never Used  . Alcohol use 0.6 oz/week    1 Glasses of wine per week     Comment: 2-3 glasses of wine at night  . Drug use: No  . Sexual activity: Yes   Other Topics Concern  .  Not on file   Social History Narrative  . No narrative on file     Current Outpatient Prescriptions:  .  5-Hydroxytryptophan (5-HTP) 100 MG CAPS, Take by mouth., Disp: , Rfl:  .  ALPHA LIPOIC ACID PO, Take by mouth., Disp: , Rfl:  .  aspirin 81 MG tablet, Take 162 mg by mouth daily., Disp: , Rfl:  .  Cholecalciferol (VITAMIN D PO), Take 7,000 mg by mouth daily., Disp: , Rfl:  .  Digestive Enzymes (DIGESTIVE ENZYME PO), Take by mouth 3 (three) times daily., Disp: , Rfl:  .  diltiazem (CARDIZEM) 30 MG tablet, Take 30 mg by mouth 4 (four) times daily as needed., Disp: , Rfl:  .  dofetilide (TIKOSYN) 125 MCG capsule, Take 3 capsules by mouth 2 (two)  times daily., Disp: , Rfl:  .  fexofenadine (ALLEGRA) 180 MG tablet, Take 180 mg by mouth as needed. , Disp: , Rfl:  .  Flaxseed, Linseed, (FLAXSEED OIL) 1000 MG CAPS, Take by mouth., Disp: , Rfl:  .  lisinopril (PRINIVIL,ZESTRIL) 5 MG tablet, TAKE (1) TABLET BY MOUTH EVERY DAY, Disp: 90 tablet, Rfl: 1 .  MAGNESIUM-POTASSIUM PO, Take 2 tablets by mouth daily., Disp: , Rfl:  .  Multiple Vitamin (MULTIVITAMIN) tablet, Take 1 tablet by mouth daily., Disp: , Rfl:  .  NON FORMULARY, AMPK Takes 2 tablets in the am and 1 tablet in the pm., Disp: , Rfl:  .  NON FORMULARY, Fucoxothin Takes 1 tablet after each meal daily., Disp: , Rfl:  .  NON FORMULARY, [place] Takes 2 tablets daily., Disp: , Rfl:  .  NON FORMULARY, Super Micro Forte Takes 2 tablets by mouth BID as needed., Disp: , Rfl:  .  Omega-3 Fatty Acids (FISH OIL) 1000 MG CAPS, Take by mouth daily., Disp: , Rfl:  .  Prasterone, DHEA, 50 MG TABS, Take 2 tablets by mouth., Disp: , Rfl:  .  Probiotic Product (PROBIOTIC DAILY PO), Take by mouth daily., Disp: , Rfl:  .  RED YEAST RICE EXTRACT PO, Take by mouth., Disp: , Rfl:  .  tamsulosin (FLOMAX) 0.4 MG CAPS capsule, Take 1 capsule (0.4 mg total) by mouth daily., Disp: 90 capsule, Rfl: 1  No Known Allergies   ROS  Constitutional: Negative for fever or weight change.  Respiratory: Negative for cough and shortness of breath.   Cardiovascular: Negative for chest pain or palpitations.  Gastrointestinal: Negative for abdominal pain, no bowel changes.  Musculoskeletal: Negative for gait problem or joint swelling.  Skin: Negative for rash.  Neurological: Negative for dizziness or headache.  No other specific complaints in a complete review of systems (except as listed in HPI above).  Objective  Vitals:   01/18/16 0921  BP: 110/72  Pulse: 66  Resp: 18  Temp: 97.8 F (36.6 C)  TempSrc: Oral  SpO2: 95%  Weight: 173 lb 9 oz (78.7 kg)    Body mass index is 24.21 kg/m.  Physical  Exam  Constitutional: Patient appears well-developed and well-nourished.  No distress.  HEENT: head atraumatic, normocephalic, pupils equal and reactive to light,neck supple, throat within normal limits Cardiovascular: Normal rate, regular rhythm and normal heart sounds.  No murmur heard. No BLE edema.Varicose vein worse on right leg Pulmonary/Chest: Effort normal and breath sounds normal. No respiratory distress. Abdominal: Soft.  There is no tenderness. Psychiatric: Patient has a normal mood and affect. behavior is normal. Judgment and thought content normal. Muscular Skeletal: possible Baker's cyst on left side, crepitus with  extension of both knees, medial pain left knee with [place]  - he wants to see an Ortho   PHQ2/9: Depression screen [facility] 2/9 01/18/2016 08/31/2015 04/20/2015 10/27/2014  Decreased Interest 0 0 0 0  Down, Depressed, Hopeless 0 0 0 0  PHQ - 2 Score 0 0 0 0     Fall Risk: Fall Risk  01/18/2016 08/31/2015 04/20/2015 10/27/2014  Falls in the past year? No No No No     Functional Status Survey: Is the patient deaf or have difficulty hearing?: No Does the patient have difficulty seeing, even when wearing glasses/contacts?: Yes Does the patient have difficulty concentrating, remembering, or making decisions?: No Does the patient have difficulty walking or climbing stairs?: No Does the patient have difficulty dressing or bathing?: No Does the patient have difficulty doing errands alone such as visiting a doctor's office or shopping?: No    Assessment & Plan   1. Left knee pain  - Ambulatory referral to Orthopedic Surgery  1. Hypercholesteremia  Refuses statin  2. Paroxysmal atrial fibrillation (HCC)  Refuses anti-coagulants, only on aspirin  3. Vitamin D deficiency  Continue supplementation   4. Obstructive apnea  No needs for CPAP   5. Systolic CHF, chronic (HCC)  - lisinopril (PRINIVIL,ZESTRIL) 5 MG tablet; TAKE (1) TABLET BY MOUTH EVERY DAY   Dispense: 90 tablet; Refill: 1  6. Varicose vein of leg  stable  7. Benign prostatic hypertrophy without urinary obstruction  - tamsulosin (FLOMAX) 0.4 MG CAPS capsule; Take 1 capsule (0.4 mg total) by mouth daily.  Dispense: 90 capsule; Refill: 1

## 2016-02-04 ENCOUNTER — Telehealth: Payer: Self-pay | Admitting: Family Medicine

## 2016-02-04 ENCOUNTER — Ambulatory Visit (INDEPENDENT_AMBULATORY_CARE_PROVIDER_SITE_OTHER): Payer: Medicare PPO

## 2016-02-04 ENCOUNTER — Other Ambulatory Visit: Payer: Self-pay

## 2016-02-04 DIAGNOSIS — I481 Persistent atrial fibrillation: Secondary | ICD-10-CM | POA: Diagnosis not present

## 2016-02-04 DIAGNOSIS — I4819 Other persistent atrial fibrillation: Secondary | ICD-10-CM

## 2016-02-06 ENCOUNTER — Ambulatory Visit (INDEPENDENT_AMBULATORY_CARE_PROVIDER_SITE_OTHER): Payer: Medicare PPO | Admitting: Internal Medicine

## 2016-02-06 ENCOUNTER — Encounter: Payer: Self-pay | Admitting: Internal Medicine

## 2016-02-06 VITALS — BP 126/84 | HR 54 | Ht 71.0 in | Wt 177.6 lb

## 2016-02-06 DIAGNOSIS — I480 Paroxysmal atrial fibrillation: Secondary | ICD-10-CM

## 2016-02-06 NOTE — Patient Instructions (Addendum)
Medication Instructions:  Your physician recommends that you continue on your current medications as directed. Please refer to the Current Medication list given to you today.   Labwork: None ordered   Testing/Procedures: Your physician has recommended that you have an ablation. Catheter ablation is a medical procedure used to treat some cardiac arrhythmias (irregular heartbeats). During catheter ablation, a long, thin, flexible tube is put into a blood vessel in your groin (upper thigh), or neck. This tube is called an ablation catheter. It is then guided to your heart through the blood vessel. Radio frequency waves destroy small areas of heart tissue where abnormal heartbeats may cause an arrhythmia to start. Please see the instruction sheet given to you today.  Call if you decide to proceed--will need CT prior    Follow-Up:  Your physician recommends that you schedule a follow-up appointment as needed   Any Other Special Instructions Will Be Listed Below (If Applicable).     If you need a refill on your cardiac medications before your next appointment, please call your pharmacy.

## 2016-02-06 NOTE — Progress Notes (Signed)
Electrophysiology Office Note   Date:  02/06/2016   ID:  Steven Griffin, DOB 06/01/1945, MRN [id number]  PCP:  Loistine Chance, MD  Cardiologist:  Dr Fletcher Anon Primary Electrophysiologist: Dr Heather Roberts at [facility]  Patient presents with  . Atrial Fibrillation     History of Present Illness: DOC TEA is a 70 y.o. male who presents today for electrophysiology evaluation.   He reports initially being diagnosed with atrial fibrillation in 2008 after presenting to primary care with an irregular rhythm.  He was unaware of his afib at that time.  He did notice while playing golf that he would have intermittent dizziness and SOB.  He thinks in retrospect that these symptoms were due to afib and had been present for several years prior.  He was evaluated by Dr Clayborn Bigness and tried several medicines (he does not recall which ones).  He became frustrated and went to see Dr Omelia Blackwater at that time.  He was placed on tikosyn.  He has done well with tikosyn since that time. He has occasional episodes of afib.  These typically occur in settings of stress or anxiety.  He thinks he may have afib < 5 episodes of symptomatic afib per year.  He thinks that episodes with typical last an hour or less.  He feels palpitations and has frequent urination during these episodes.  + mild SOB during afib.   Today, he denies symptoms of palpitations, chest pain,orthopnea, PND, lower extremity edema, claudication, dizziness, presyncope, syncope, bleeding, or neurologic sequela. The patient is tolerating medications without difficulties and is otherwise without complaint today.    Past Medical History:  Diagnosis Date  . Allergic rhinitis, cause unspecified   . Atrial fibrillation ([place])   . COPD (chronic obstructive pulmonary disease) ([place])    pt denies  . Diverticulosis of colon (without mention of hemorrhage)   . Essential hypertension, benign    pt denies  . Hyperlipidemia   . Impotence of organic  origin   . Mild anxiety   . Skin cancer   . Unspecified venous (peripheral) insufficiency    Past Surgical History:  Procedure Laterality Date  . COLONOSCOPY WITH PROPOFOL N/A 11/20/2014   Procedure: COLONOSCOPY WITH PROPOFOL;  Surgeon: Manya Silvas, MD;  Location: [facility] ENDOSCOPY;  Service: Endoscopy;  Laterality: N/A;  . rib cartilage     benign tumor  . SKIN CANCER EXCISION Right 06/2010   lower leg  . TONSILLECTOMY AND ADENOIDECTOMY    . VEIN LIGATION AND STRIPPING       Current Outpatient Prescriptions  Medication Sig Dispense Refill  . 5-Hydroxytryptophan (5-HTP) 100 MG CAPS Take 1 capsule by mouth at bedtime.     . ALPHA LIPOIC ACID PO Take by mouth as directed.     Marland Kitchen aspirin EC 325 MG tablet Take 1 tablet (325 mg total) by mouth daily. 30 tablet 0  . Cholecalciferol (VITAMIN D PO) Take 7,000 mg by mouth daily.    . Digestive Enzymes (DIGESTIVE ENZYME PO) Take 1 capsule by mouth 3 (three) times daily.     Marland Kitchen diltiazem (CARDIZEM) 30 MG tablet Take 30 mg by mouth 4 (four) times daily as needed.    . dofetilide (TIKOSYN) 125 MCG capsule Take 3 capsules by mouth 2 (two) times daily.    . fexofenadine (ALLEGRA) 180 MG tablet Take 180 mg by mouth daily as needed for allergies or rhinitis.     . Flaxseed, Linseed, (FLAXSEED OIL) 1000 MG CAPS  Take 1 capsule by mouth daily.     Marland Kitchen lisinopril (PRINIVIL,ZESTRIL) 5 MG tablet TAKE (1) TABLET BY MOUTH EVERY DAY 90 tablet 1  . MAGNESIUM PO Take 3 capsules by mouth daily.    . Melatonin 3 MG CAPS Take 1 capsule by mouth at bedtime.    . Multiple Vitamin (MULTIVITAMIN) tablet Take 1 tablet by mouth daily.    . NON FORMULARY AMPK - Take 2 tablets by mouth in the am and 1 tablet in the pm.    . NON FORMULARY Super Micro Forte - Take 2 tablets by mouth twice a day    . Omega-3 Fatty Acids (FISH OIL) 1000 MG CAPS Take 1 capsule by mouth daily.     . Prasterone, DHEA, 50 MG TABS Take 2 tablets by mouth as directed.     . Probiotic Product  (PROBIOTIC DAILY PO) Take 1 capsule by mouth daily.     . RED YEAST RICE EXTRACT PO Take 1 capsule by mouth daily.     . tamsulosin (FLOMAX) 0.4 MG CAPS capsule Take 1 capsule (0.4 mg total) by mouth daily. 90 capsule 1   No current facility-administered medications for this visit.     Allergies:   Review of patient's allergies indicates no known allergies.   Social History:  The patient  reports that he has been smoking Cigarettes and Cigars.  He has a 30.00 pack-year smoking history. He has never used smokeless tobacco. He reports that he drinks about 0.6 oz of alcohol per week . He reports that he does not use drugs.   Family History:  The patient's  family history includes Alcoholism in his brother; Cirrhosis in his brother; Colon cancer in his maternal grandmother; Hypercholesterolemia in his father; Hyperlipidemia in his brother; Hypertension in his brother; Leukemia in his father; Suicidality in his mother.    ROS:  Please see the history of present illness.   All other systems are reviewed and negative.    PHYSICAL EXAM: VS:  BP 126/84   Pulse (!) 54   Ht 5\' 11"  (1.803 m)   Wt 177 lb 9.6 oz (80.6 kg)   BMI 24.77 kg/m  , BMI Body mass index is 24.77 kg/m. GEN: Well nourished, well developed, in no acute distress  HEENT: normal  Neck: no JVD, carotid bruits, or masses Cardiac: RRR; no murmurs, rubs, or gallops,no edema  Respiratory:  clear to auscultation bilaterally, normal work of breathing GI: soft, nontender, nondistended, + BS MS: no deformity or atrophy  Skin: warm and dry  Neuro:  Strength and sensation are intact Psych: euthymic mood, full affect  EKG:  EKG is ordered today. The ekg ordered today shows sinus rhythm   Recent Labs: 08/31/2015: ALT 20; BUN 15; Creatinine, Ser 0.89; Platelets 291; Potassium 5.0; Sodium 142    Lipid Panel     Component Value Date/Time   CHOL 185 08/31/2015 0839   TRIG 68 08/31/2015 0839   HDL 61 08/31/2015 0839   CHOLHDL 3.0  08/31/2015 0839   LDLCALC 110 (H) 08/31/2015 0839     Wt Readings from Last 3 Encounters:  02/06/16 177 lb 9.6 oz (80.6 kg)  01/18/16 173 lb 9 oz (78.7 kg)  12/24/15 173 lb 12.8 oz (78.8 kg)      Other studies Reviewed: Additional studies/ records that were reviewed today include: Dr Jacklynn Ganong notes, recent echo  Review of the above records today demonstrates: EF 55%, mild MR, normal LA size   ASSESSMENT AND  PLAN:  1.  Paroxysmal atrial fibrillation The patient has symptomatic recurrent afib.  He has failed medical therapy with tikosyn. Therapeutic strategies for afib including medicine and ablation were discussed in detail with the patient today. Risk, benefits, and alternatives to EP study and radiofrequency ablation for afib were also discussed in detail today. These risks include but are not limited to stroke, bleeding, vascular damage, tamponade, perforation, damage to the esophagus, lungs, and other structures, pulmonary vein stenosis, worsening renal function, and death. The patient understands these risk and wishes to think about this further. He will contact my office if he wishes to proceed with ablation.  He would require initiation of xarelto for 3 weeks prior to ablation.  I would also obtain cardiac CT within 1 week prior to ablation to evaluate LAA for thrombus.  If he decides to avoid the procedure then he will continue to follow with Drs Fletcher Anon and Omelia Blackwater and I will see as needed.    Signed, [name], MD    [address] [place] [address] [phone] (office) [phone] (fax)

## 2016-05-01 ENCOUNTER — Encounter: Payer: Self-pay | Admitting: Family Medicine

## 2016-05-01 ENCOUNTER — Ambulatory Visit (INDEPENDENT_AMBULATORY_CARE_PROVIDER_SITE_OTHER): Payer: Medicare HMO | Admitting: Family Medicine

## 2016-05-01 VITALS — BP 120/68 | HR 62 | Temp 96.0°F | Resp 16 | Wt 177.3 lb

## 2016-05-01 DIAGNOSIS — R51 Headache: Secondary | ICD-10-CM | POA: Diagnosis not present

## 2016-05-01 DIAGNOSIS — R251 Tremor, unspecified: Secondary | ICD-10-CM

## 2016-05-01 DIAGNOSIS — R519 Headache, unspecified: Secondary | ICD-10-CM

## 2016-05-01 DIAGNOSIS — R361 Hematospermia: Secondary | ICD-10-CM | POA: Diagnosis not present

## 2016-05-01 DIAGNOSIS — R2689 Other abnormalities of gait and mobility: Secondary | ICD-10-CM | POA: Diagnosis not present

## 2016-05-01 MED ORDER — AMOXICILLIN-POT CLAVULANATE 875-125 MG PO TABS: 1.0000 | ORAL_TABLET | Freq: Two times a day (BID) | ORAL | 0 refills | Status: DC

## 2016-05-01 NOTE — Progress Notes (Signed)
Name: Steven Griffin   MRN: [id number]    DOB: 11/26/1945   Date:05/01/2016       Progress Note  Subjective  Chief Complaint  Chief Complaint  Patient presents with  . Penile Discharge    no pain just bloody discharge during ejaculation no swelling 2-3 months with symptoms pt stated that it has happened twice    HPI  Hematospermia: he states that he noticed blood in his semen after a nocturnal emission and again after intercourse with his wife. He denies any pain or discomfort, denies fever, pelvic pain or increase in urinary frequency.   Balance change: he has noticed that occasionally he gets off balance, he takes a couple off steps and gets back to normal. He states not usually related to standing up or change in position. He has noticed headaches, described as nagging sensation of his entire head, and he denies any history of headaches in the past. No weakness. Denies dysarthria. Symptoms started a few months ago. Happens at least weekly. Right hand tremors going on for years, but more noticeable, affecting his handwriting at times.   Patient Active Problem List   Diagnosis Date Noted  . Bradycardia 08/31/2015  . Muscle tear 04/20/2015  . Allergic rhinitis, seasonal 04/20/2015  . Chronic venous insufficiency 10/27/2014  . Intermittent tremor 10/27/2014  . ED (erectile dysfunction) of non-organic origin 10/27/2014  . Colon, diverticulosis 10/27/2014  . Benign prostatic hypertrophy without urinary obstruction 10/27/2014  . Systolic CHF, chronic ([place]) 08/08/2013  . Bundle branch block, right and left anterior fascicular 06/07/2013  . Atrial fibrillation ([place]) 10/20/2012  . Obstructive apnea 03/23/2012  . Polypharmacy 04/15/2011  . Hypercholesteremia 01/18/2011  . H/O squamous cell carcinoma of skin 01/18/2011  . History of major vascular surgery 01/18/2011    Past Surgical History:  Procedure Laterality Date  . COLONOSCOPY WITH PROPOFOL N/A 11/20/2014   Procedure: COLONOSCOPY  WITH PROPOFOL;  Surgeon: Manya Silvas, MD;  Location: [facility] ENDOSCOPY;  Service: Endoscopy;  Laterality: N/A;  . rib cartilage     benign tumor  . SKIN CANCER EXCISION Right 06/2010   lower leg  . TONSILLECTOMY AND ADENOIDECTOMY    . VEIN LIGATION AND STRIPPING      Family History  Problem Relation Age of Onset  . Suicidality Mother   . Leukemia Father   . Hypercholesterolemia Father   . Colon cancer Maternal Grandmother   . Hypertension Brother   . Hyperlipidemia Brother   . Cirrhosis Brother   . Alcoholism Brother     Social History   Social History  . Marital status: Married    Spouse name: Neoma Laming  . Number of children: 2  . Years of education: 14   Occupational History  . Product manager    Social History Main Topics  . Smoking status: Current Every Day Smoker    Packs/day: 1.00    Years: 30.00    Types: Cigarettes, Cigars  . Smokeless tobacco: Never Used  . Alcohol use 0.6 oz/week    1 Glasses of wine per week     Comment: 2-3 glasses of wine at night  . Drug use: No  . Sexual activity: Yes   Other Topics Concern  . Not on file   Social History Narrative   Pt lives in [place] with spouse.  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     Current Outpatient Prescriptions:  .  5-Hydroxytryptophan (5-HTP) 100 MG CAPS, Take 1 capsule  by mouth at bedtime. , Disp: , Rfl:  .  ALPHA LIPOIC ACID PO, Take by mouth as directed. , Disp: , Rfl:  .  amoxicillin-clavulanate (AUGMENTIN) 875-125 MG tablet, Take 1 tablet by mouth 2 (two) times daily., Disp: 20 tablet, Rfl: 0 .  aspirin EC 325 MG tablet, Take 1 tablet (325 mg total) by mouth daily., Disp: 30 tablet, Rfl: 0 .  Cholecalciferol (VITAMIN D PO), Take 7,000 mg by mouth daily., Disp: , Rfl:  .  Digestive Enzymes (DIGESTIVE ENZYME PO), Take 1 capsule by mouth 3 (three) times daily. , Disp: , Rfl:  .  diltiazem (CARDIZEM) 30 MG tablet, Take 30 mg by mouth 4 (four) times daily as needed.,  Disp: , Rfl:  .  dofetilide (TIKOSYN) 125 MCG capsule, Take 3 capsules by mouth 2 (two) times daily., Disp: , Rfl:  .  fexofenadine (ALLEGRA) 180 MG tablet, Take 180 mg by mouth daily as needed for allergies or rhinitis. , Disp: , Rfl:  .  Flaxseed, Linseed, (FLAXSEED OIL) 1000 MG CAPS, Take 1 capsule by mouth daily. , Disp: , Rfl:  .  lisinopril (PRINIVIL,ZESTRIL) 5 MG tablet, TAKE (1) TABLET BY MOUTH EVERY DAY, Disp: 90 tablet, Rfl: 1 .  MAGNESIUM PO, Take 3 capsules by mouth daily., Disp: , Rfl:  .  Melatonin 3 MG CAPS, Take 1 capsule by mouth at bedtime., Disp: , Rfl:  .  Multiple Vitamin (MULTIVITAMIN) tablet, Take 1 tablet by mouth daily., Disp: , Rfl:  .  NON FORMULARY, AMPK - Take 2 tablets by mouth in the am and 1 tablet in the pm., Disp: , Rfl:  .  NON FORMULARY, Super Micro Forte - Take 2 tablets by mouth twice a day, Disp: , Rfl:  .  Omega-3 Fatty Acids (FISH OIL) 1000 MG CAPS, Take 1 capsule by mouth daily. , Disp: , Rfl:  .  Prasterone, DHEA, 50 MG TABS, Take 2 tablets by mouth as directed. , Disp: , Rfl:  .  Probiotic Product (PROBIOTIC DAILY PO), Take 1 capsule by mouth daily. , Disp: , Rfl:  .  RED YEAST RICE EXTRACT PO, Take 1 capsule by mouth daily. , Disp: , Rfl:  .  tamsulosin (FLOMAX) 0.4 MG CAPS capsule, Take 1 capsule (0.4 mg total) by mouth daily., Disp: 90 capsule, Rfl: 1  No Known Allergies   ROS  Ten systems reviewed and is negative except as mentioned in HPI   Objective  Vitals:   05/01/16 1307  BP: 120/68  Pulse: 62  Resp: 16  Temp: (!) 96 F (35.6 C)  SpO2: 96%  Weight: 177 lb 5 oz (80.4 kg)    Body mass index is 24.73 kg/m.  Physical Exam  Constitutional: Patient appears well-developed and well-nourished. No distress.  HEENT: head atraumatic, normocephalic, pupils equal and reactive to light,  neck supple, throat within normal limits Cardiovascular: Normal rate, regular rhythm and normal heart sounds.  No murmur heard. No BLE  edema. Pulmonary/Chest: Effort normal and breath sounds normal. No respiratory distress. Abdominal: Soft.  There is no tenderness. Psychiatric: Patient has a normal mood and affect. behavior is normal. Judgment and thought content normal. Neurological exam: no focal findings, normal cranial nerves, no tremors during exam, normal tandem walk , romberg negative  PHQ2/9: Depression screen [facility] 2/9 01/18/2016 08/31/2015 04/20/2015 10/27/2014  Decreased Interest 0 0 0 0  Down, Depressed, Hopeless 0 0 0 0  PHQ - 2 Score 0 0 0 0     Fall  Risk: Fall Risk  01/18/2016 08/31/2015 04/20/2015 10/27/2014  Falls in the past year? No No No No     Assessment & Plan  1. Hematospermia  He takes Tykosin and QT prolongation can happen with Cipro and Levaquin - Ambulatory referral to Urology - amoxicillin-clavulanate (AUGMENTIN) 875-125 MG tablet; Take 1 tablet by mouth 2 (two) times daily.  Dispense: 20 tablet; Refill: 0  2. Balance problems  - CT Head Wo Contrast; Future - Ambulatory referral to Neurology  3. Tremors of nervous system  - CT Head Wo Contrast; Future - Ambulatory referral to Neurology  4. New onset of headaches after age 30  - CT Head Wo Contrast; Future - Ambulatory referral to Neurology

## 2016-05-05 ENCOUNTER — Ambulatory Visit: Payer: Medicare HMO

## 2016-05-19 ENCOUNTER — Ambulatory Visit
Admission: RE | Admit: 2016-05-19 | Discharge: 2016-05-19 | Disposition: A | Discharge status: Home / Self Care | Payer: Medicare HMO | Source: Ambulatory Visit | Attending: Family Medicine | Admitting: Family Medicine

## 2016-05-19 DIAGNOSIS — R2689 Other abnormalities of gait and mobility: Secondary | ICD-10-CM

## 2016-05-19 DIAGNOSIS — R51 Headache: Secondary | ICD-10-CM | POA: Insufficient documentation

## 2016-05-19 DIAGNOSIS — R251 Tremor, unspecified: Secondary | ICD-10-CM | POA: Diagnosis not present

## 2016-05-19 DIAGNOSIS — R519 Headache, unspecified: Secondary | ICD-10-CM

## 2016-06-05 DIAGNOSIS — M531 Cervicobrachial syndrome: Secondary | ICD-10-CM | POA: Diagnosis not present

## 2016-06-05 DIAGNOSIS — M5136 Other intervertebral disc degeneration, lumbar region: Secondary | ICD-10-CM | POA: Diagnosis not present

## 2016-06-05 DIAGNOSIS — M9901 Segmental and somatic dysfunction of cervical region: Secondary | ICD-10-CM | POA: Diagnosis not present

## 2016-06-05 DIAGNOSIS — M9903 Segmental and somatic dysfunction of lumbar region: Secondary | ICD-10-CM | POA: Diagnosis not present

## 2016-06-05 DIAGNOSIS — M6080 Other myositis, unspecified site: Secondary | ICD-10-CM | POA: Diagnosis not present

## 2016-06-05 DIAGNOSIS — M9902 Segmental and somatic dysfunction of thoracic region: Secondary | ICD-10-CM | POA: Diagnosis not present

## 2016-06-12 ENCOUNTER — Ambulatory Visit: Payer: Medicare HMO | Admitting: Cardiovascular Disease

## 2016-06-12 NOTE — Progress Notes (Deleted)
Cardiology Office Note   Date:  06/12/2016   ID:  Steven Griffin, DOB March 16, 1946, MRN [id number]  PCP:  Loistine Chance, MD  Cardiologist:   Kathlyn Sacramento, MD   No chief complaint on file.     History of Present Illness: Steven Griffin is a 71 y.o. male who presents for a follow-up visit regarding persistent atrial fibrillation currently on dofetilide. He follows up at [facility] cardiology as well. He has known history of bradycardia, squamous cell carcinoma of the skin, near syncope, obstructive sleep apnea, and history of persistent atrial fibrillation, currently on dofetilide (Tikosyn) therapy.  He has been on dofetilide since 2008 with good control. He has no history of congestive heart failure or stroke. Most recent echocardiogram in 2015 showed normal LV systolic function and no significant valvular abnormalities. He smokes one and a half pack per day but he does not inhale and only smokes organic tobacco. He has been doing very well and denies any chest pain, shortness of breath or significant palpitations.  He has been on anticoagulation briefly in the past but currently is only on aspirin. Catheter ablation has been discussed with him in the past but he has not pursued this. He seems to be more interested now. He has stable palpitations and denies any chest pain. He has mild exertional dyspnea with no recent worsening.  Past Medical History:  Diagnosis Date  . Allergic rhinitis, cause unspecified   . Atrial fibrillation ([place])   . COPD (chronic obstructive pulmonary disease) ([place])    pt denies  . Diverticulosis of colon (without mention of hemorrhage)   . Essential hypertension, benign    pt denies  . Hyperlipidemia   . Impotence of organic origin   . Mild anxiety   . Skin cancer   . Unspecified venous (peripheral) insufficiency     Past Surgical History:  Procedure Laterality Date  . COLONOSCOPY WITH PROPOFOL N/A 11/20/2014   Procedure: COLONOSCOPY WITH PROPOFOL;   Surgeon: Manya Silvas, MD;  Location: [facility] ENDOSCOPY;  Service: Endoscopy;  Laterality: N/A;  . rib cartilage     benign tumor  . SKIN CANCER EXCISION Right 06/2010   lower leg  . TONSILLECTOMY AND ADENOIDECTOMY    . VEIN LIGATION AND STRIPPING       Current Outpatient Prescriptions  Medication Sig Dispense Refill  . 5-Hydroxytryptophan (5-HTP) 100 MG CAPS Take 1 capsule by mouth at bedtime.     . ALPHA LIPOIC ACID PO Take by mouth as directed.     Marland Kitchen amoxicillin-clavulanate (AUGMENTIN) 875-125 MG tablet Take 1 tablet by mouth 2 (two) times daily. 20 tablet 0  . aspirin EC 325 MG tablet Take 1 tablet (325 mg total) by mouth daily. 30 tablet 0  . Cholecalciferol (VITAMIN D PO) Take 7,000 mg by mouth daily.    . Digestive Enzymes (DIGESTIVE ENZYME PO) Take 1 capsule by mouth 3 (three) times daily.     Marland Kitchen diltiazem (CARDIZEM) 30 MG tablet Take 30 mg by mouth 4 (four) times daily as needed.    . dofetilide (TIKOSYN) 125 MCG capsule Take 3 capsules by mouth 2 (two) times daily.    . fexofenadine (ALLEGRA) 180 MG tablet Take 180 mg by mouth daily as needed for allergies or rhinitis.     . Flaxseed, Linseed, (FLAXSEED OIL) 1000 MG CAPS Take 1 capsule by mouth daily.     Marland Kitchen lisinopril (PRINIVIL,ZESTRIL) 5 MG tablet TAKE (1) TABLET BY MOUTH EVERY DAY 90  tablet 1  . MAGNESIUM PO Take 3 capsules by mouth daily.    . Melatonin 3 MG CAPS Take 1 capsule by mouth at bedtime.    . Multiple Vitamin (MULTIVITAMIN) tablet Take 1 tablet by mouth daily.    . NON FORMULARY AMPK - Take 2 tablets by mouth in the am and 1 tablet in the pm.    . NON FORMULARY Super Micro Forte - Take 2 tablets by mouth twice a day    . Omega-3 Fatty Acids (FISH OIL) 1000 MG CAPS Take 1 capsule by mouth daily.     . Prasterone, DHEA, 50 MG TABS Take 2 tablets by mouth as directed.     . Probiotic Product (PROBIOTIC DAILY PO) Take 1 capsule by mouth daily.     . RED YEAST RICE EXTRACT PO Take 1 capsule by mouth daily.     .  tamsulosin (FLOMAX) 0.4 MG CAPS capsule Take 1 capsule (0.4 mg total) by mouth daily. 90 capsule 1   No current facility-administered medications for this visit.     Allergies:   Patient has no known allergies.    Social History:  The patient  reports that he has been smoking Cigarettes and Cigars.  He has a 30.00 pack-year smoking history. He has never used smokeless tobacco. He reports that he drinks about 0.6 oz of alcohol per week . He reports that he does not use drugs.   Family History:  The patient's family history includes Alcoholism in his brother; Cirrhosis in his brother; Colon cancer in his maternal grandmother; Hypercholesterolemia in his father; Hyperlipidemia in his brother; Hypertension in his brother; Leukemia in his father; Suicidality in his mother.    ROS:  Please see the history of present illness.   Otherwise, review of systems are positive for none.   All other systems are reviewed and negative.    PHYSICAL EXAM: VS:  There were no vitals taken for this visit. , BMI There is no height or weight on file to calculate BMI. GEN: Well nourished, well developed, in no acute distress  HEENT: normal  Neck: no JVD, carotid bruits, or masses Cardiac: RRR; no murmurs, rubs, or gallops,no edema  Respiratory:  clear to auscultation bilaterally, normal work of breathing GI: soft, nontender, nondistended, + BS MS: no deformity or atrophy  Skin: warm and dry, no rash Neuro:  Strength and sensation are intact Psych: euthymic mood, full affect   EKG:  EKG is ordered today. The ekg ordered today demonstrates sinus bradycardia with PACs. Right bundle branch block. Normal QT interval.   Recent Labs: 08/31/2015: ALT 20; BUN 15; Creatinine, Ser 0.89; Platelets 291; Potassium 5.0; Sodium 142    Lipid Panel    Component Value Date/Time   CHOL 185 08/31/2015 0839   TRIG 68 08/31/2015 0839   HDL 61 08/31/2015 0839   CHOLHDL 3.0 08/31/2015 0839   LDLCALC 110 (H) 08/31/2015 0839        Wt Readings from Last 3 Encounters:  05/01/16 177 lb 5 oz (80.4 kg)  02/06/16 177 lb 9.6 oz (80.6 kg)  01/18/16 173 lb 9 oz (78.7 kg)       No flowsheet data found.    ASSESSMENT AND PLAN:  1.  Persistent atrial fibrillation: Currently maintaining in sinus rhythm with dofetilide with good control overall. The patient has failed other antiarrhythmic medications in the past including propafenone. The idea of catheter ablation has been discussed with the patient in the past at [facility]  but he did not pursue. He seems to be more interested now but wants to discuss this in our practice. Thus, I referred the patient EP for evaluation. I ordered an echocardiogram before that to evaluate for any structural heart abnormalities and to determine his atrial size. He did have previous cardiomyopathy but most recent ejection fraction was normal. I do think that with successful ablation, he might be able to come off dofetilide. I also discussed with him the issue that his CHADS VASc score is 1. He reports being on lisinopril not for hypertension. Even with a score of 1, the most recent guideline suggest anticoagulation for males with a score of 1. I discussed this with him and he wants to read about this.  2. Obstructive sleep apnea: According to his primary care physician, his sleep apnea was overall mild and did not qualify him for CPAP therapy.  3. Tobacco use: The patient continues to smoke. Smoking cessation was not discussed today.  Disposition:   FU with me in 6 months  Signed,  Kathlyn Sacramento, MD  06/12/2016 8:17 AM    [place]

## 2016-06-23 ENCOUNTER — Encounter: Payer: Self-pay | Admitting: Cardiovascular Disease

## 2016-06-23 ENCOUNTER — Ambulatory Visit (INDEPENDENT_AMBULATORY_CARE_PROVIDER_SITE_OTHER): Payer: Medicare HMO | Admitting: Cardiovascular Disease

## 2016-06-23 DIAGNOSIS — I481 Persistent atrial fibrillation: Secondary | ICD-10-CM

## 2016-06-23 DIAGNOSIS — I4819 Other persistent atrial fibrillation: Secondary | ICD-10-CM

## 2016-06-23 MED ORDER — DOFETILIDE 125 MCG PO CAPS: 375.0000 ug | ORAL_CAPSULE | Freq: Two times a day (BID) | ORAL | 2 refills | Status: DC

## 2016-06-23 MED ORDER — DILTIAZEM HCL 30 MG PO TABS: 30.0000 mg | ORAL_TABLET | Freq: Four times a day (QID) | ORAL | 3 refills | Status: DC | PRN

## 2016-06-23 MED ORDER — LISINOPRIL 5 MG PO TABS: ORAL_TABLET | ORAL | 2 refills | Status: DC

## 2016-06-23 NOTE — Progress Notes (Signed)
Cardiology Office Note   Date:  06/23/2016   ID:  Steven Griffin, DOB 12/12/1945, MRN [id number]  PCP:  Loistine Chance, MD  Cardiologist:   Kathlyn Sacramento, MD   Chief Complaint  Patient presents with  . other    Afib. Meds reviewed verbally with pt.      History of Present Illness: Steven Griffin is a 71 y.o. male who presents for a follow-up visit regarding persistent atrial fibrillation currently on dofetilide. He follows up at [facility] cardiology as well. He has known history of bradycardia, squamous cell carcinoma of the skin, near syncope, obstructive sleep apnea.  He has been on dofetilide since 2008 with good control. He has no history of congestive heart failure or stroke. Most recent echocardiogram in 2017 showed normal LV systolic function and no significant valvular abnormalities. He smokes one and a half pack per day but he does not inhale and only smokes organic tobacco. He was seen by Dr. Rayann Heman last year due to discuss catheter ablation. The patient elected to continue treatment with dofetilide. He has been doing reasonably well with no chest pain, shortness of breath or palpitations up until just before his appointment after he had lunch when he developed palpitations with mild shortness of breath and went into atrial fibrillation. He does not remember the last time he went into atrial fibrillation.    Past Medical History:  Diagnosis Date  . Allergic rhinitis, cause unspecified   . Atrial fibrillation ([place])   . COPD (chronic obstructive pulmonary disease) ([place])    pt denies  . Diverticulosis of colon (without mention of hemorrhage)   . Essential hypertension, benign    pt denies  . Hyperlipidemia   . Impotence of organic origin   . Mild anxiety   . Skin cancer   . Unspecified venous (peripheral) insufficiency     Past Surgical History:  Procedure Laterality Date  . COLONOSCOPY WITH PROPOFOL N/A 11/20/2014   Procedure: COLONOSCOPY WITH PROPOFOL;  Surgeon:  Manya Silvas, MD;  Location: [facility] ENDOSCOPY;  Service: Endoscopy;  Laterality: N/A;  . rib cartilage     benign tumor  . SKIN CANCER EXCISION Right 06/2010   lower leg  . TONSILLECTOMY AND ADENOIDECTOMY    . VEIN LIGATION AND STRIPPING       Current Outpatient Prescriptions  Medication Sig Dispense Refill  . 5-Hydroxytryptophan (5-HTP) 100 MG CAPS Take 1 capsule by mouth at bedtime.     . ALPHA LIPOIC ACID PO Take by mouth as directed.     Marland Kitchen aspirin EC 325 MG tablet Take 1 tablet (325 mg total) by mouth daily. 30 tablet 0  . Cholecalciferol (VITAMIN D PO) Take 7,000 mg by mouth daily.    . Digestive Enzymes (DIGESTIVE ENZYME PO) Take 1 capsule by mouth 3 (three) times daily.     Marland Kitchen diltiazem (CARDIZEM) 30 MG tablet Take 30 mg by mouth 4 (four) times daily as needed.    . dofetilide (TIKOSYN) 125 MCG capsule Take 3 capsules by mouth 2 (two) times daily.    . fexofenadine (ALLEGRA) 180 MG tablet Take 180 mg by mouth daily as needed for allergies or rhinitis.     . Flaxseed, Linseed, (FLAXSEED OIL) 1000 MG CAPS Take 1 capsule by mouth daily.     Marland Kitchen lisinopril (PRINIVIL,ZESTRIL) 5 MG tablet TAKE (1) TABLET BY MOUTH EVERY DAY 90 tablet 1  . MAGNESIUM PO Take 3 capsules by mouth daily.    Marland Kitchen  Melatonin 3 MG CAPS Take 1 capsule by mouth at bedtime.    . Multiple Vitamin (MULTIVITAMIN) tablet Take 1 tablet by mouth daily.    . NON FORMULARY AMPK - Take 1 tablet daily.    . NON FORMULARY Super Micro Forte - Take 2 tablets by mouth twice a day    . Omega-3 Fatty Acids (FISH OIL) 1000 MG CAPS Take 2 capsules by mouth 2 (two) times daily.     . Probiotic Product (PROBIOTIC DAILY PO) Take 1 capsule by mouth daily.     . RED YEAST RICE EXTRACT PO Take 1 capsule by mouth daily.     . tamsulosin (FLOMAX) 0.4 MG CAPS capsule Take 1 capsule (0.4 mg total) by mouth daily. 90 capsule 1   No current facility-administered medications for this visit.     Allergies:   Patient has no known allergies.     Social History:  The patient  reports that he has been smoking Cigarettes and Cigars.  He has a 30.00 pack-year smoking history. He has never used smokeless tobacco. He reports that he drinks about 0.6 oz of alcohol per week . He reports that he does not use drugs.   Family History:  The patient's family history includes Alcoholism in his brother; Cirrhosis in his brother; Colon cancer in his maternal grandmother; Hypercholesterolemia in his father; Hyperlipidemia in his brother; Hypertension in his brother; Leukemia in his father; Suicidality in his mother.    ROS:  Please see the history of present illness.   Otherwise, review of systems are positive for none.   All other systems are reviewed and negative.    PHYSICAL EXAM: VS:  BP 116/70 (BP Location: Left Arm, Patient Position: Sitting, Cuff Size: Normal)   Pulse (!) 135   Ht 5\' 11"  (1.803 m)   Wt 180 lb 4 oz (81.8 kg)   BMI 25.14 kg/m  , BMI Body mass index is 25.14 kg/m. GEN: Well nourished, well developed, in no acute distress  HEENT: normal  Neck: no JVD, carotid bruits, or masses Cardiac: Irregularly irregular and mildly tachycardic; no murmurs, rubs, or gallops,no edema  Respiratory:  clear to auscultation bilaterally, normal work of breathing GI: soft, nontender, nondistended, + BS MS: no deformity or atrophy  Skin: warm and dry, no rash Neuro:  Strength and sensation are intact Psych: euthymic mood, full affect   EKG:  EKG is ordered today. Atrial fibrillation with right bundle branch block and left anterior fascicular block..   Recent Labs: 08/31/2015: ALT 20; BUN 15; Creatinine, Ser 0.89; Platelets 291; Potassium 5.0; Sodium 142    Lipid Panel    Component Value Date/Time   CHOL 185 08/31/2015 0839   TRIG 68 08/31/2015 0839   HDL 61 08/31/2015 0839   CHOLHDL 3.0 08/31/2015 0839   LDLCALC 110 (H) 08/31/2015 0839      Wt Readings from Last 3 Encounters:  06/23/16 180 lb 4 oz (81.8 kg)  05/01/16 177 lb 5  oz (80.4 kg)  02/06/16 177 lb 9.6 oz (80.6 kg)       No flowsheet data found.    ASSESSMENT AND PLAN:  1.  Persistent atrial fibrillation: He is noted to be in atrial fibrillation with rapid ventricular response. He has minimal symptoms related to this. We gave him 60 mg of diltiazem by mouth in the clinic. Continue treatment with dofetilide. He is going to monitor his symptoms closely at home. According to him, his atrial fibrillation typically does not  last long. I informed him to call us if he continues to be in atrial fibrillation. We again discussed about the option of proceeding with ablation. He is more open to the idea but wants to wait until May.  2. Obstructive sleep apnea: According to his primary care physician, his sleep apnea was overall mild and did not qualify him for CPAP therapy.  3. Tobacco use: The patient continues to smoke.   Disposition:   FU with me in 6 months  Signed,  Kathlyn Sacramento, MD  06/23/2016 2:26 PM    [place]

## 2016-06-23 NOTE — Patient Instructions (Addendum)
Medication Instructions:  Your physician recommends that you continue on your current medications as directed. Please refer to the Current Medication list given to you today.   Labwork: none  Testing/Procedures: none  Follow-Up: Your physician wants you to follow-up in: 6 months with Dr. Fletcher Anon. You will receive a reminder letter in the mail two months in advance. If you don't receive a letter, please call our office to schedule the follow-up appointment.  Your physician recommends that you schedule a follow-up appointment with Dr. Rayann Heman in May     Any Other Special Instructions Will Be Listed Below (If Applicable).     If you need a refill on your cardiac medications before your next appointment, please call your pharmacy.

## 2016-07-07 DIAGNOSIS — L570 Actinic keratosis: Secondary | ICD-10-CM | POA: Diagnosis not present

## 2016-07-07 DIAGNOSIS — Z872 Personal history of diseases of the skin and subcutaneous tissue: Secondary | ICD-10-CM | POA: Diagnosis not present

## 2016-07-07 DIAGNOSIS — Z859 Personal history of malignant neoplasm, unspecified: Secondary | ICD-10-CM | POA: Diagnosis not present

## 2016-07-07 DIAGNOSIS — L821 Other seborrheic keratosis: Secondary | ICD-10-CM | POA: Diagnosis not present

## 2016-08-05 ENCOUNTER — Encounter: Payer: Self-pay | Admitting: Family Medicine

## 2016-08-05 ENCOUNTER — Ambulatory Visit (INDEPENDENT_AMBULATORY_CARE_PROVIDER_SITE_OTHER): Payer: Medicare HMO | Admitting: Family Medicine

## 2016-08-05 VITALS — BP 104/62 | HR 82 | Temp 98.0°F | Resp 16 | Ht 71.0 in | Wt 179.5 lb

## 2016-08-05 DIAGNOSIS — R319 Hematuria, unspecified: Secondary | ICD-10-CM

## 2016-08-05 DIAGNOSIS — E7800 Pure hypercholesterolemia, unspecified: Secondary | ICD-10-CM | POA: Diagnosis not present

## 2016-08-05 DIAGNOSIS — R361 Hematospermia: Secondary | ICD-10-CM | POA: Diagnosis not present

## 2016-08-05 DIAGNOSIS — R2689 Other abnormalities of gait and mobility: Secondary | ICD-10-CM | POA: Diagnosis not present

## 2016-08-05 DIAGNOSIS — I480 Paroxysmal atrial fibrillation: Secondary | ICD-10-CM | POA: Diagnosis not present

## 2016-08-05 DIAGNOSIS — H6122 Impacted cerumen, left ear: Secondary | ICD-10-CM

## 2016-08-05 DIAGNOSIS — N401 Enlarged prostate with lower urinary tract symptoms: Secondary | ICD-10-CM

## 2016-08-05 DIAGNOSIS — I5022 Chronic systolic (congestive) heart failure: Secondary | ICD-10-CM

## 2016-08-05 DIAGNOSIS — R739 Hyperglycemia, unspecified: Secondary | ICD-10-CM

## 2016-08-05 DIAGNOSIS — Z125 Encounter for screening for malignant neoplasm of prostate: Secondary | ICD-10-CM | POA: Diagnosis not present

## 2016-08-05 DIAGNOSIS — N138 Other obstructive and reflux uropathy: Secondary | ICD-10-CM

## 2016-08-05 DIAGNOSIS — G4733 Obstructive sleep apnea (adult) (pediatric): Secondary | ICD-10-CM | POA: Diagnosis not present

## 2016-08-05 DIAGNOSIS — Z79899 Other long term (current) drug therapy: Secondary | ICD-10-CM

## 2016-08-05 MED ORDER — TAMSULOSIN HCL 0.4 MG PO CAPS: 0.4000 mg | ORAL_CAPSULE | Freq: Every day | ORAL | 1 refills | Status: DC

## 2016-08-05 NOTE — Progress Notes (Signed)
Name: Steven Griffin   MRN: [id number]    DOB: Jul 31, 1945   Date:08/05/2016       Progress Note  Subjective  Chief Complaint  Chief Complaint  Patient presents with  . Medication Refill    3 month F/U  . Hypertension  . Sleep Apnea  . Allergic Rhinitis     Takes Claritin and Allegra has needed.  . Hyperlipidemia    Refuses Statins, takes red yeast rice and fish oil    HPI   Paroxysmal Afib: sees Dr. Fletcher Anon and also Dr. Omelia Blackwater and is currently on aspirin 325 mg, he refuses to take Pradaxa. Taking Tykosin, but only has Cardizem to take prn if he goes in afib. He has states last episode was in Feb when he saw cardiologist. No chest pain. He is thinking about ablation, tired of taking Tykosin - because of the amount of pills and the cost.  Chronic Systolic mild: taking lisinopril, no hypotension, denies SOB with activity. No orthopnea or PND.   OSA: had sleep study but does not need to use CPAP machine, no snoring, he denies waking up with a headache, he is tired because he is busy, not because of lack of sleep   Hyperlipidemia: refuses statin only on red yeast rice, he denies muscle aches.   Hematospermia: he noticed blood in his semen after a nocturnal emission and again after intercourse with his wife . He denied any pain or discomfort, denies fever, pelvic pain or increase in urinary frequency. He was referred to Urologist but missed appointment because he is too busy. He had one episode of dark sediments ( looked like dry blood ) in his urine - it happened about 6 weeks ago.  Balance change: over the past 6 months he noticed that occasionally he gets off balance, he takes a couple off steps and gets back to normal. He states not usually related to standing up or change in position. Headaches has resolved. No weakness. Denies dysarthria.Happens at least weekly. Right hand tremors going on for years, more noticeable over the past 6 months, affecting his handwriting at times. CT done  and was negative. He was referred to Neurologist but did not go to the appointment   Patient Active Problem List   Diagnosis Date Noted  . Bradycardia 08/31/2015  . Muscle tear 04/20/2015  . Allergic rhinitis, seasonal 04/20/2015  . Chronic venous insufficiency 10/27/2014  . Intermittent tremor 10/27/2014  . ED (erectile dysfunction) of non-organic origin 10/27/2014  . Colon, diverticulosis 10/27/2014  . Benign prostatic hypertrophy without urinary obstruction 10/27/2014  . Systolic CHF, chronic ([place]) 08/08/2013  . Bundle branch block, right and left anterior fascicular 06/07/2013  . Atrial fibrillation ([place]) 10/20/2012  . Obstructive apnea 03/23/2012  . Polypharmacy 04/15/2011  . Hypercholesteremia 01/18/2011  . H/O squamous cell carcinoma of skin 01/18/2011  . History of major vascular surgery 01/18/2011    Past Surgical History:  Procedure Laterality Date  . COLONOSCOPY WITH PROPOFOL N/A 11/20/2014   Procedure: COLONOSCOPY WITH PROPOFOL;  Surgeon: Manya Silvas, MD;  Location: [facility] ENDOSCOPY;  Service: Endoscopy;  Laterality: N/A;  . rib cartilage     benign tumor  . SKIN CANCER EXCISION Right 06/2010   lower leg  . TONSILLECTOMY AND ADENOIDECTOMY    . VEIN LIGATION AND STRIPPING      Family History  Problem Relation Age of Onset  . Suicidality Mother   . Leukemia Father   . Hypercholesterolemia Father   . Colon cancer  Maternal Grandmother   . Hypertension Brother   . Hyperlipidemia Brother   . Cirrhosis Brother   . Alcoholism Brother     Social History   Social History  . Marital status: Married    Spouse name: Neoma Laming  . Number of children: 2  . Years of education: 14   Occupational History  . Product manager    Social History Main Topics  . Smoking status: Current Every Day Smoker    Packs/day: 1.00    Years: 30.00    Types: Cigarettes, Cigars  . Smokeless tobacco: Never Used  . Alcohol use 0.6 oz/week    1 Glasses of wine per week      Comment: 2-3 glasses of wine at night  . Drug use: No  . Sexual activity: Yes   Other Topics Concern  . Not on file   Social History Narrative   Pt lives in [place] with spouse.  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     Current Outpatient Prescriptions:  .  5-Hydroxytryptophan (5-HTP) 100 MG CAPS, Take 1 capsule by mouth at bedtime. , Disp: , Rfl:  .  ALPHA LIPOIC ACID PO, Take by mouth as directed. , Disp: , Rfl:  .  aspirin EC 325 MG tablet, Take 1 tablet (325 mg total) by mouth daily., Disp: 30 tablet, Rfl: 0 .  Cholecalciferol (VITAMIN D PO), Take 7,000 mg by mouth daily., Disp: , Rfl:  .  Digestive Enzymes (DIGESTIVE ENZYME PO), Take 1 capsule by mouth 3 (three) times daily. , Disp: , Rfl:  .  diltiazem (CARDIZEM) 30 MG tablet, Take 1 tablet (30 mg total) by mouth 4 (four) times daily as needed., Disp: 90 tablet, Rfl: 3 .  dofetilide (TIKOSYN) 125 MCG capsule, Take 3 capsules (375 mcg total) by mouth 2 (two) times daily., Disp: 180 capsule, Rfl: 2 .  fexofenadine (ALLEGRA) 180 MG tablet, Take 180 mg by mouth daily as needed for allergies or rhinitis. , Disp: , Rfl:  .  Flaxseed, Linseed, (FLAXSEED OIL) 1000 MG CAPS, Take 1 capsule by mouth daily. , Disp: , Rfl:  .  lisinopril (PRINIVIL,ZESTRIL) 5 MG tablet, TAKE (1) TABLET BY MOUTH EVERY DAY, Disp: 90 tablet, Rfl: 2 .  MAGNESIUM PO, Take 3 capsules by mouth daily., Disp: , Rfl:  .  Melatonin 3 MG CAPS, Take 1 capsule by mouth at bedtime., Disp: , Rfl:  .  Multiple Vitamin (MULTIVITAMIN) tablet, Take 1 tablet by mouth daily., Disp: , Rfl:  .  NON FORMULARY, AMPK - Take 1 tablet daily., Disp: , Rfl:  .  NON FORMULARY, Super Micro Forte - Take 2 tablets by mouth twice a day, Disp: , Rfl:  .  Omega-3 Fatty Acids (FISH OIL) 1000 MG CAPS, Take 2 capsules by mouth 2 (two) times daily. , Disp: , Rfl:  .  Probiotic Product (PROBIOTIC DAILY PO), Take 1 capsule by mouth daily. , Disp: , Rfl:  .  RED YEAST RICE EXTRACT  PO, Take 1 capsule by mouth daily. , Disp: , Rfl:  .  tamsulosin (FLOMAX) 0.4 MG CAPS capsule, Take 1 capsule (0.4 mg total) by mouth daily., Disp: 90 capsule, Rfl: 1  No Known Allergies   ROS  Constitutional: Negative for fever or weight change.  Respiratory: Negative for cough and shortness of breath.   Cardiovascular: Negative for chest pain or palpitations.  Gastrointestinal: Negative for abdominal pain, no bowel changes.  Musculoskeletal: Negative for gait problem or joint  swelling.  Skin: Negative for rash.  Neurological: Negative for dizziness or headache. Occasional blurred vision intermittently  No other specific complaints in a complete review of systems (except as listed in HPI above).  Objective  Vitals:   08/05/16 0935  BP: 104/62  Pulse: 82  Resp: 16  Temp: 98 F (36.7 C)  TempSrc: Oral  SpO2: 96%  Weight: 179 lb 8 oz (81.4 kg)  Height: 5\' 11"  (1.803 m)    Body mass index is 25.04 kg/m.  Physical Exam  Constitutional: Patient appears well-developed and well-nourished.  No distress.  HEENT: head atraumatic, normocephalic, pupils equal and reactive to light, ear: left TM no seen - cerumen on ear canal, neck supple, throat within normal limits Cardiovascular: Normal rate, regular rhythm and normal heart sounds.  No murmur heard. No BLE edema. Pulmonary/Chest: Effort normal and breath sounds normal. No respiratory distress. Abdominal: Soft.  There is no tenderness. Psychiatric: Patient has a normal mood and affect. behavior is normal. Judgment and thought content normal. Neurological: negative tandem, normal gait today   PHQ2/9: Depression screen [facility] 2/9 08/05/2016 01/18/2016 08/31/2015 04/20/2015 10/27/2014  Decreased Interest 0 0 0 0 0  Down, Depressed, Hopeless 0 0 0 0 0  PHQ - 2 Score 0 0 0 0 0    Fall Risk: Fall Risk  08/05/2016 01/18/2016 08/31/2015 04/20/2015 10/27/2014  Falls in the past year? No No No No No    Functional Status Survey: Is the patient  deaf or have difficulty hearing?: No Does the patient have difficulty seeing, even when wearing glasses/contacts?: No Does the patient have difficulty concentrating, remembering, or making decisions?: No Does the patient have difficulty walking or climbing stairs?: No Does the patient have difficulty dressing or bathing?: No Does the patient have difficulty doing errands alone such as visiting a doctor's office or shopping?: No    Assessment & Plan  1. Hypercholesteremia  - Lipid panel  2. Obstructive apnea  Not on CPAP   3. Systolic CHF, chronic (HCC)  Doing well, occasionally has edema lower extremities, no orthopnea at this time - COMPLETE METABOLIC PANEL WITH GFR  4. Paroxysmal atrial fibrillation (HCC)  Continue follow up with Dr. Fletcher Anon  5. Hematospermia  Advised to see Urologist   6. Hematuria, unspecified type   7. Balance problems  Advised to follow up with Neurologist   8. Benign prostatic hyperplasia with urinary obstruction  - tamsulosin (FLOMAX) 0.4 MG CAPS capsule; Take 1 capsule (0.4 mg total) by mouth daily.  Dispense: 90 capsule; Refill: 1  9. Hyperglycemia  - Hemoglobin A1c - Insulin, fasting  10. Encounter for prostate cancer screening  - PSA  11. Long-term use of high-risk medication  - CBC with Differential/Platelet   12. Left ear impacted cerumen  - Ear Lavage  Verbal consent given Possible side effects discussed with patient Ears were  lavaged with warm water and peroxide  Patient tolerated procedure well No complications

## 2016-08-19 ENCOUNTER — Encounter: Payer: Self-pay | Admitting: Internal Medicine

## 2016-09-02 ENCOUNTER — Encounter: Payer: Self-pay | Admitting: Family Medicine

## 2016-09-02 ENCOUNTER — Telehealth: Payer: Self-pay | Admitting: Family Medicine

## 2016-09-02 ENCOUNTER — Ambulatory Visit (INDEPENDENT_AMBULATORY_CARE_PROVIDER_SITE_OTHER): Payer: Medicare HMO | Admitting: Family Medicine

## 2016-09-02 ENCOUNTER — Other Ambulatory Visit: Payer: Self-pay

## 2016-09-02 VITALS — BP 110/64 | HR 58 | Temp 97.6°F | Resp 16 | Ht 71.0 in | Wt 183.2 lb

## 2016-09-02 DIAGNOSIS — I5022 Chronic systolic (congestive) heart failure: Secondary | ICD-10-CM

## 2016-09-02 DIAGNOSIS — M25562 Pain in left knee: Secondary | ICD-10-CM

## 2016-09-02 DIAGNOSIS — Z0000 Encounter for general adult medical examination without abnormal findings: Secondary | ICD-10-CM | POA: Diagnosis not present

## 2016-09-02 DIAGNOSIS — I952 Hypotension due to drugs: Secondary | ICD-10-CM | POA: Diagnosis not present

## 2016-09-02 DIAGNOSIS — N529 Male erectile dysfunction, unspecified: Secondary | ICD-10-CM

## 2016-09-02 MED ORDER — TADALAFIL 20 MG PO TABS: 20.0000 mg | ORAL_TABLET | Freq: Every day | ORAL | 2 refills | Status: DC | PRN

## 2016-09-02 MED ORDER — SILDENAFIL CITRATE 20 MG PO TABS: 20.0000 mg | ORAL_TABLET | Freq: Every day | ORAL | 2 refills | Status: DC | PRN

## 2016-09-02 NOTE — Progress Notes (Signed)
Name: Steven Griffin   MRN: [id number]    DOB: 1945-05-29   Date:09/02/2016       Progress Note  Subjective  Chief Complaint  Chief Complaint  Patient presents with  . Annual Exam    HPI  Functional ability/safety issues: No Issues Hearing issues: Addressed  Activities of daily living: Discussed Home safety issues: No Issues  End Of Life Planning: Offered verbal information regarding advanced directives, healthcare power of attorney.  Preventative care, Health maintenance, Preventative health measures discussed.  Preventative screenings discussed today: lab work, colonoscopy, PSA.  Men age 70 to 40 years if ever smoked recommended to get a one time AAA ultrasound screening exam.  Low Dose CT Chest recommended if Age 31-80 years, 30 pack-year currently smoking OR have quit w/in 15years.   Lifestyle risk factor issued reviewed: Diet, exercise, weight management, advised patient smoking is not healthy, nutrition/diet.  Preventative health measures discussed (5-10 year plan).  Reviewed and recommended vaccinations: - Pneumovax  - Prevnar  - Annual Influenza - Zostavax - Tdap   IPSS Questionnaire (AUA-7): Over the past month.   1)  How often have you had a sensation of not emptying your bladder completely after you finish urinating?  1 - Less than 1 time in 5  2)  How often have you had to urinate again less than two hours after you finished urinating? 1 - Less than 1 time in 5  3)  How often have you found you stopped and started again several times when you urinated?  3 - About half the time  4) How difficult have you found it to postpone urination?  1 - Less than 1 time in 5  5) How often have you had a weak urinary stream?  0 - Not at all  6) How often have you had to push or strain to begin urination?  1 - Less than 1 time in 5  7) How many times did you most typically get up to urinate from the time you went to bed until the time you got up in the morning?  0 - None   Total score:  0-7 mildly symptomatic   8-19 moderately symptomatic   20-35 severely symptomatic   Depression screening: Done Fall risk screening: Done - he fell recently, tripped on his suit case Discuss ADLs/IADLs: Done  Current medical providers: See HPI  Other health risk factors identified this visit: No other issues Cognitive impairment issues: None identified  All above discussed with patient. Appropriate education, counseling and referral will be made based upon the above.   Acute knee pain: he returned from a trip from [facility] and got up during the night and tripped on his suitcase with right foot and landed forward on the left knee, he was able to stand up and walk. It happened one week ago and is still sore medially, no swelling, causes antalgic gait intermittently, using ice and using some topical medication. Pain is down to 4/10, and is gradually improving  Low bp : he has noticed that even though he takes 5 mg of lisinopril, he wakes up in am's feeling a little tired, but improves as he hydrates and moves around. It is occasional, he states occasionally he needs to lay down.   Chronic Systolic Mild: he is on lisinopril, no chest pain, or orthopnea. Feeling well. Compliant with medication  ED: he has difficulty maintaining an erection, he states wife is unpredictable and would like to try Cialis, discussed  possible side effects  Patient Active Problem List   Diagnosis Date Noted  . Bradycardia 08/31/2015  . Muscle tear 04/20/2015  . Allergic rhinitis, seasonal 04/20/2015  . Chronic venous insufficiency 10/27/2014  . Intermittent tremor 10/27/2014  . ED (erectile dysfunction) of non-organic origin 10/27/2014  . Colon, diverticulosis 10/27/2014  . Benign prostatic hypertrophy without urinary obstruction 10/27/2014  . Systolic CHF, chronic ([place]) 08/08/2013  . Bundle branch block, right and left anterior fascicular 06/07/2013  . Atrial fibrillation ([place]) 10/20/2012  .  Obstructive apnea 03/23/2012  . Polypharmacy 04/15/2011  . Hypercholesteremia 01/18/2011  . H/O squamous cell carcinoma of skin 01/18/2011  . History of major vascular surgery 01/18/2011    Past Surgical History:  Procedure Laterality Date  . COLONOSCOPY WITH PROPOFOL N/A 11/20/2014   Procedure: COLONOSCOPY WITH PROPOFOL;  Surgeon: Manya Silvas, MD;  Location: [facility] ENDOSCOPY;  Service: Endoscopy;  Laterality: N/A;  . rib cartilage     benign tumor  . SKIN CANCER EXCISION Right 06/2010   lower leg  . TONSILLECTOMY AND ADENOIDECTOMY    . VEIN LIGATION AND STRIPPING      Family History  Problem Relation Age of Onset  . Suicidality Mother   . Leukemia Father   . Hypercholesterolemia Father   . Colon cancer Maternal Grandmother   . Hypertension Brother   . Hyperlipidemia Brother   . Cirrhosis Brother   . Alcoholism Brother     Social History   Social History  . Marital status: Married    Spouse name: Neoma Laming  . Number of children: 2  . Years of education: 14   Occupational History  . Product manager    Social History Main Topics  . Smoking status: Current Every Day Smoker    Packs/day: 1.00    Years: 30.00    Types: Cigarettes, Cigars  . Smokeless tobacco: Never Used  . Alcohol use 0.6 oz/week    1 Glasses of wine per week     Comment: 2-3 glasses of wine at night  . Drug use: No  . Sexual activity: Yes   Other Topics Concern  . Not on file   Social History Narrative   Pt lives in [place] with spouse.  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     Current Outpatient Prescriptions:  .  5-Hydroxytryptophan (5-HTP) 100 MG CAPS, Take 1 capsule by mouth at bedtime. , Disp: , Rfl:  .  ALPHA LIPOIC ACID PO, Take by mouth as directed. , Disp: , Rfl:  .  aspirin EC 325 MG tablet, Take 1 tablet (325 mg total) by mouth daily., Disp: 30 tablet, Rfl: 0 .  Cholecalciferol (VITAMIN D PO), Take 7,000 mg by mouth daily., Disp: , Rfl:  .   Digestive Enzymes (DIGESTIVE ENZYME PO), Take 1 capsule by mouth 3 (three) times daily. , Disp: , Rfl:  .  diltiazem (CARDIZEM) 30 MG tablet, Take 1 tablet (30 mg total) by mouth 4 (four) times daily as needed., Disp: 90 tablet, Rfl: 3 .  dofetilide (TIKOSYN) 125 MCG capsule, Take 3 capsules (375 mcg total) by mouth 2 (two) times daily., Disp: 180 capsule, Rfl: 2 .  fexofenadine (ALLEGRA) 180 MG tablet, Take 180 mg by mouth daily as needed for allergies or rhinitis. , Disp: , Rfl:  .  Flaxseed, Linseed, (FLAXSEED OIL) 1000 MG CAPS, Take 1 capsule by mouth daily. , Disp: , Rfl:  .  lisinopril (PRINIVIL,ZESTRIL) 5 MG tablet, TAKE (1) TABLET BY  MOUTH EVERY DAY, Disp: 90 tablet, Rfl: 2 .  MAGNESIUM PO, Take 3 capsules by mouth daily., Disp: , Rfl:  .  Melatonin 3 MG CAPS, Take 1 capsule by mouth at bedtime., Disp: , Rfl:  .  Multiple Vitamin (MULTIVITAMIN) tablet, Take 1 tablet by mouth daily., Disp: , Rfl:  .  NON FORMULARY, AMPK - Take 1 tablet daily., Disp: , Rfl:  .  NON FORMULARY, Super Micro Forte - Take 2 tablets by mouth twice a day, Disp: , Rfl:  .  Omega-3 Fatty Acids (FISH OIL) 1000 MG CAPS, Take 2 capsules by mouth 2 (two) times daily. , Disp: , Rfl:  .  Probiotic Product (PROBIOTIC DAILY PO), Take 1 capsule by mouth daily. , Disp: , Rfl:  .  RED YEAST RICE EXTRACT PO, Take 1 capsule by mouth daily. , Disp: , Rfl:  .  tamsulosin (FLOMAX) 0.4 MG CAPS capsule, Take 1 capsule (0.4 mg total) by mouth daily., Disp: 90 capsule, Rfl: 1  No Known Allergies   ROS  Constitutional: Negative for fever or weight change.  Respiratory: Negative for cough and shortness of breath.   Cardiovascular: Negative for chest pain or palpitations.  Gastrointestinal: Negative for abdominal pain, no bowel changes.  Musculoskeletal: Positive  for gait problem but no  joint swelling.  Skin: Negative for rash.  Neurological: Negative for dizziness or headache.  No other specific complaints in a complete review  of systems (except as listed in HPI above).  Objective  Vitals:   09/02/16 0836  BP: 110/64  Pulse: (!) 58  Resp: 16  Temp: 97.6 F (36.4 C)  SpO2: 92%  Weight: 183 lb 4 oz (83.1 kg)  Height: 5\' 11"  (1.803 m)    Body mass index is 25.56 kg/m.  Physical Exam  Constitutional: Patient appears well-developed and well-nourished. No distress.  HENT: Head: Normocephalic and atraumatic. Ears: B TMs ok, no erythema or effusion; Nose: Nose normal. Mouth/Throat: Oropharynx is clear and moist. No oropharyngeal exudate.  Eyes: Conjunctivae and EOM are normal. Pupils are equal, round, and reactive to light. No scleral icterus.  Neck: Normal range of motion. Neck supple. No JVD present. No thyromegaly present.  Cardiovascular: Normal rate, regular rhythm and normal heart sounds.  No murmur heard. No BLE edema. Pulmonary/Chest: Effort normal and breath sounds normal. No respiratory distress. Abdominal: Soft. Bowel sounds are normal, no distension. There is no tenderness. no masses MALE GENITALIA: Normal descended testes bilaterally, no masses palpated, no hernias, no lesions, no discharge RECTAL: Prostate enlarged, but no nodules, normal  consistency, no rectal masses or hemorrhoids  Musculoskeletal: Normal range of motion, no joint effusions. No gross deformities Crepitus with extension of both knees.  Neurological: he is alert and oriented to person, place, and time. No cranial nerve deficit. Coordination, balance, strength, speech and gait are normal.  Skin: Skin is warm and dry. No rash noted. No erythema.  Psychiatric: Patient has a normal mood and affect. behavior is normal. Judgment and thought content normal.  PHQ2/9: Depression screen [facility] 2/9 09/02/2016 08/05/2016 01/18/2016 08/31/2015 04/20/2015  Decreased Interest 0 0 0 0 0  Down, Depressed, Hopeless 0 0 0 0 0  PHQ - 2 Score 0 0 0 0 0     Fall Risk: Fall Risk  09/02/2016 09/02/2016 08/05/2016 01/18/2016 08/31/2015  Falls in the past year?  Yes No No No No  Injury with Fall? Yes - - - -   Tripped on suit case at night  Functional Status  Survey: Is the patient deaf or have difficulty hearing?: No Does the patient have difficulty seeing, even when wearing glasses/contacts?: No Does the patient have difficulty concentrating, remembering, or making decisions?: No Does the patient have difficulty walking or climbing stairs?: Yes (recent fall ) Does the patient have difficulty dressing or bathing?: No Does the patient have difficulty doing errands alone such as visiting a doctor's office or shopping?: No    Assessment & Plan  1. Medicare annual wellness visit, subsequent  Discussed importance of 150 minutes of physical activity weekly, eat two servings of fish weekly, eat one serving of tree nuts ( cashews, pistachios, pecans, almonds.Marland Kitchen) every other day, eat 6 servings of fruit/vegetables daily and drink plenty of water and avoid sweet beverages.   2. Systolic CHF, chronic ([place])  Doing well at this time  3. Hypotension due to drugs  Explained importance of continuing medication because of CHF, get up slowly and stay hydrated  4. Acute pain of left knee  Reassurance for now, normal exam, but may need MRI if it does not resolve  5. Erectile dysfunction, unspecified erectile dysfunction type  - tadalafil (CIALIS) 20 MG tablet; Take 1 tablet (20 mg total) by mouth daily as needed for erectile dysfunction.  Dispense: 10 tablet; Refill: 2

## 2016-09-02 NOTE — Telephone Encounter (Signed)
BECCA WITH [facility] IS ASKING IF THEY CAN GET THE MEDICATION CILAS 20 CHANGED TO  SILDENASIL FOR IT IS CHEAPER.

## 2016-09-02 NOTE — Telephone Encounter (Signed)
I contacted this patient to see if he wanted the cheaper medication or to stay with the Brand. He stated that he knew the pharmacist and will give them a call then will let us to how he wants to proceed.

## 2016-09-02 NOTE — Progress Notes (Unsigned)
sil

## 2016-09-02 NOTE — Telephone Encounter (Signed)
Patient prefers Cialis, I will change if he is okay with it

## 2016-09-02 NOTE — Patient Instructions (Signed)
Preventive Care 70 Years and Older, Male Preventive care refers to lifestyle choices and visits with your health care provider that can promote health and wellness. What does preventive care include?  A yearly physical exam. This is also called an annual well check.  Dental exams once or twice a year.  Routine eye exams. Ask your health care provider how often you should have your eyes checked.  Personal lifestyle choices, including:  Daily care of your teeth and gums.  Regular physical activity.  Eating a healthy diet.  Avoiding tobacco and drug use.  Limiting alcohol use.  Practicing safe sex.  Taking low doses of aspirin every day.  Taking vitamin and mineral supplements as recommended by your health care provider. What happens during an annual well check? The services and screenings done by your health care provider during your annual well check will depend on your age, overall health, lifestyle risk factors, and family history of disease. Counseling  Your health care provider may ask you questions about your:  Alcohol use.  Tobacco use.  Drug use.  Emotional well-being.  Home and relationship well-being.  Sexual activity.  Eating habits.  History of falls.  Memory and ability to understand (cognition).  Work and work environment. Screening  You may have the following tests or measurements:  Height, weight, and BMI.  Blood pressure.  Lipid and cholesterol levels. These may be checked every 5 years, or more frequently if you are over 50 years old.  Skin check.  Lung cancer screening. You may have this screening every year starting at age 55 if you have a 30-pack-year history of smoking and currently smoke or have quit within the past 15 years.  Fecal occult blood test (FOBT) of the stool. You may have this test every year starting at age 50.  Flexible sigmoidoscopy or colonoscopy. You may have a sigmoidoscopy every 5 years or a colonoscopy every 10  years starting at age 50.  Prostate cancer screening. Recommendations will vary depending on your family history and other risks.  Hepatitis C blood test.  Hepatitis B blood test.  Sexually transmitted disease (STD) testing.  Diabetes screening. This is done by checking your blood sugar (glucose) after you have not eaten for a while (fasting). You may have this done every 1-3 years.  Abdominal aortic aneurysm (AAA) screening. You may need this if you are a current or former smoker.  Osteoporosis. You may be screened starting at age 70 if you are at high risk. Talk with your health care provider about your test results, treatment options, and if necessary, the need for more tests. Vaccines  Your health care provider may recommend certain vaccines, such as:  Influenza vaccine. This is recommended every year.  Tetanus, diphtheria, and acellular pertussis (Tdap, Td) vaccine. You may need a Td booster every 10 years.  Varicella vaccine. You may need this if you have not been vaccinated.  Zoster vaccine. You may need this after age 60.  Measles, mumps, and rubella (MMR) vaccine. You may need at least one dose of MMR if you were born in 1957 or later. You may also need a second dose.  Pneumococcal 13-valent conjugate (PCV13) vaccine. One dose is recommended after age 70.  Pneumococcal polysaccharide (PPSV23) vaccine. One dose is recommended after age 70.  Meningococcal vaccine. You may need this if you have certain conditions.  Hepatitis A vaccine. You may need this if you have certain conditions or if you travel or work in places where   you may be exposed to hepatitis A.  Hepatitis B vaccine. You may need this if you have certain conditions or if you travel or work in places where you may be exposed to hepatitis B.  Haemophilus influenzae type b (Hib) vaccine. You may need this if you have certain risk factors. Talk to your health care provider about which screenings and vaccines you  need and how often you need them. This information is not intended to replace advice given to you by your health care provider. Make sure you discuss any questions you have with your health care provider. Document Released: 05/11/2015 Document Revised: 01/02/2016 Document Reviewed: 02/13/2015 Elsevier Interactive Patient Education  2017 [employer].

## 2016-09-08 ENCOUNTER — Ambulatory Visit: Payer: Medicare HMO | Admitting: Internal Medicine

## 2016-09-09 ENCOUNTER — Encounter: Payer: Self-pay | Admitting: Internal Medicine

## 2016-09-10 ENCOUNTER — Other Ambulatory Visit: Payer: Self-pay | Admitting: Cardiovascular Disease

## 2016-09-10 ENCOUNTER — Telehealth: Payer: Self-pay | Admitting: Cardiovascular Disease

## 2016-09-10 MED ORDER — DOFETILIDE 125 MCG PO CAPS: ORAL_CAPSULE | ORAL | 3 refills | Status: DC

## 2016-09-10 NOTE — Telephone Encounter (Signed)
Pt calling stating he need [place] to send in the brand name of Tikosyn  He's having issues with the generic brand It needs to say that it is "medically necessary" on it Please advise   [facility] is the pharmacy he is using

## 2016-09-16 NOTE — Telephone Encounter (Signed)
done

## 2016-10-21 DIAGNOSIS — M9903 Segmental and somatic dysfunction of lumbar region: Secondary | ICD-10-CM | POA: Diagnosis not present

## 2016-10-21 DIAGNOSIS — M9901 Segmental and somatic dysfunction of cervical region: Secondary | ICD-10-CM | POA: Diagnosis not present

## 2016-10-21 DIAGNOSIS — M5136 Other intervertebral disc degeneration, lumbar region: Secondary | ICD-10-CM | POA: Diagnosis not present

## 2016-10-21 DIAGNOSIS — M546 Pain in thoracic spine: Secondary | ICD-10-CM | POA: Diagnosis not present

## 2016-10-21 DIAGNOSIS — M9902 Segmental and somatic dysfunction of thoracic region: Secondary | ICD-10-CM | POA: Diagnosis not present

## 2016-10-21 DIAGNOSIS — M531 Cervicobrachial syndrome: Secondary | ICD-10-CM | POA: Diagnosis not present

## 2016-11-24 DIAGNOSIS — M5136 Other intervertebral disc degeneration, lumbar region: Secondary | ICD-10-CM | POA: Diagnosis not present

## 2016-11-24 DIAGNOSIS — M9902 Segmental and somatic dysfunction of thoracic region: Secondary | ICD-10-CM | POA: Diagnosis not present

## 2016-11-24 DIAGNOSIS — M9901 Segmental and somatic dysfunction of cervical region: Secondary | ICD-10-CM | POA: Diagnosis not present

## 2016-11-24 DIAGNOSIS — M9903 Segmental and somatic dysfunction of lumbar region: Secondary | ICD-10-CM | POA: Diagnosis not present

## 2016-11-24 DIAGNOSIS — M546 Pain in thoracic spine: Secondary | ICD-10-CM | POA: Diagnosis not present

## 2016-11-24 DIAGNOSIS — M531 Cervicobrachial syndrome: Secondary | ICD-10-CM | POA: Diagnosis not present

## 2016-12-02 ENCOUNTER — Telehealth: Payer: Self-pay | Admitting: Family Medicine

## 2016-12-02 NOTE — Telephone Encounter (Signed)
Patient called wanting to have his recent office notes faxed to [facility] in [place].  He is already set up with an appointment.  Office notes were faxed on 12/02/16 @ 12:23pm through [facility].

## 2016-12-08 DIAGNOSIS — N401 Enlarged prostate with lower urinary tract symptoms: Secondary | ICD-10-CM | POA: Diagnosis not present

## 2016-12-08 DIAGNOSIS — R3914 Feeling of incomplete bladder emptying: Secondary | ICD-10-CM | POA: Diagnosis not present

## 2016-12-08 DIAGNOSIS — M4010 Other secondary kyphosis, site unspecified: Secondary | ICD-10-CM | POA: Diagnosis not present

## 2016-12-08 DIAGNOSIS — R3915 Urgency of urination: Secondary | ICD-10-CM | POA: Diagnosis not present

## 2016-12-08 DIAGNOSIS — R361 Hematospermia: Secondary | ICD-10-CM | POA: Diagnosis not present

## 2016-12-08 DIAGNOSIS — R350 Frequency of micturition: Secondary | ICD-10-CM | POA: Diagnosis not present

## 2016-12-09 DIAGNOSIS — R32 Unspecified urinary incontinence: Secondary | ICD-10-CM | POA: Diagnosis not present

## 2016-12-09 DIAGNOSIS — R3989 Other symptoms and signs involving the genitourinary system: Secondary | ICD-10-CM | POA: Diagnosis not present

## 2016-12-09 DIAGNOSIS — N401 Enlarged prostate with lower urinary tract symptoms: Secondary | ICD-10-CM | POA: Diagnosis not present

## 2016-12-17 DIAGNOSIS — R972 Elevated prostate specific antigen [PSA]: Secondary | ICD-10-CM | POA: Diagnosis not present

## 2016-12-17 DIAGNOSIS — N401 Enlarged prostate with lower urinary tract symptoms: Secondary | ICD-10-CM | POA: Diagnosis not present

## 2016-12-17 DIAGNOSIS — R3914 Feeling of incomplete bladder emptying: Secondary | ICD-10-CM | POA: Diagnosis not present

## 2016-12-18 ENCOUNTER — Encounter: Payer: Self-pay | Admitting: Cardiovascular Disease

## 2016-12-18 ENCOUNTER — Ambulatory Visit (INDEPENDENT_AMBULATORY_CARE_PROVIDER_SITE_OTHER): Payer: Medicare HMO | Admitting: Cardiovascular Disease

## 2016-12-18 VITALS — BP 126/74 | HR 64 | Ht 70.0 in | Wt 176.0 lb

## 2016-12-18 DIAGNOSIS — Z136 Encounter for screening for cardiovascular disorders: Secondary | ICD-10-CM | POA: Diagnosis not present

## 2016-12-18 DIAGNOSIS — I4819 Other persistent atrial fibrillation: Secondary | ICD-10-CM

## 2016-12-18 DIAGNOSIS — I4891 Unspecified atrial fibrillation: Secondary | ICD-10-CM

## 2016-12-18 DIAGNOSIS — I481 Persistent atrial fibrillation: Secondary | ICD-10-CM | POA: Diagnosis not present

## 2016-12-18 DIAGNOSIS — Z720 Tobacco use: Secondary | ICD-10-CM | POA: Diagnosis not present

## 2016-12-18 NOTE — Patient Instructions (Signed)
Medication Instructions:  Your physician recommends that you continue on your current medications as directed. Please refer to the Current Medication list given to you today.   Labwork: Your physician recommends that you return for lab work in: TODAY (BMP, MG).   Testing/Procedures: Your physician has requested that you have an abdominal aorta duplex. During this test, an ultrasound is used to evaluate the aorta. Allow 30 minutes for this exam. Do not eat after midnight the day before and avoid carbonated beverages   Follow-Up: Your physician recommends that you schedule a follow-up appointment WITH DR Rayann Heman FOR EVALUATION OF ABLATION.  Your physician wants you to follow-up in:  [place].  You will receive a reminder letter in the mail two months in advance. If you don't receive a letter, please call our office to schedule the follow-up appointment.    If you need a refill on your cardiac medications before your next appointment, please call your pharmacy.

## 2016-12-18 NOTE — Progress Notes (Signed)
Cardiology Office Note   Date:  12/18/2016   ID:  [facility], DOB July 27, 1945, MRN [id number]  PCP:  Steele Sizer, MD  Cardiologist:   Kathlyn Sacramento, MD   Chief Complaint  Patient presents with  . other    6 month f/u no complaints today. Meds reviewed verbally with pt.      History of Present Illness: Steven [facility] is a 71 y.o. male who presents for a follow-up visit regarding persistent atrial fibrillation currently on dofetilide. He used to be followed at [facility] cardiology. He has known history of bradycardia, squamous cell carcinoma of the skin, near syncope, obstructive sleep apnea. Previous coronary calcium score in 2015 showed only mild calcification with a score of 26. He has been on dofetilide since 2008 with good control. He has no history of congestive heart failure or stroke. Most recent echocardiogram in 2017 showed normal LV systolic function and no significant valvular abnormalities. He smokes one and a half pack per day but he does not inhale and only smokes organic tobacco. He was seen by Dr. Rayann Heman last year due to discuss catheter ablation. The patient elected to continue treatment with dofetilide.  He has noted more episodes of atrial fibrillation especially after he switched to generic dofetilide. He switch back to the brand name with subsequent improvement but he continues to have intermittent palpitations. He also complains of episodes of dizziness and what sounds to be presyncope. Most recent episode was about 2 weeks ago.    Past Medical History:  Diagnosis Date  . Allergic rhinitis, cause unspecified   . Atrial fibrillation ([place])   . COPD (chronic obstructive pulmonary disease) ([place])    pt denies  . Diverticulosis of colon (without mention of hemorrhage)   . Essential hypertension, benign    pt denies  . Hyperlipidemia   . Impotence of organic origin   . Mild anxiety   . Skin cancer   . Unspecified venous (peripheral) insufficiency      Past Surgical History:  Procedure Laterality Date  . COLONOSCOPY WITH PROPOFOL N/A 11/20/2014   Procedure: COLONOSCOPY WITH PROPOFOL;  Surgeon: Manya Silvas, MD;  Location: [facility] ENDOSCOPY;  Service: Endoscopy;  Laterality: N/A;  . rib cartilage     benign tumor  . SKIN CANCER EXCISION Right 06/2010   lower leg  . TONSILLECTOMY AND ADENOIDECTOMY    . VEIN LIGATION AND STRIPPING       Current Outpatient Prescriptions  Medication Sig Dispense Refill  . 5-Hydroxytryptophan (5-HTP) 100 MG CAPS Take 1 capsule by mouth at bedtime.     . ALPHA LIPOIC ACID PO Take by mouth as directed.     Marland Kitchen aspirin EC 325 MG tablet Take 1 tablet (325 mg total) by mouth daily. 30 tablet 0  . Cholecalciferol (VITAMIN D PO) Take 7,000 mg by mouth daily.    . Digestive Enzymes (DIGESTIVE ENZYME PO) Take 1 capsule by mouth 3 (three) times daily.     Marland Kitchen diltiazem (CARDIZEM) 30 MG tablet Take 30 mg by mouth 4 (four) times daily as needed (Take as directed).    . dofetilide (TIKOSYN) 125 MCG capsule TAKE 3 CAPSULES TWICE DAILY. 180 capsule 3  . fexofenadine (ALLEGRA) 180 MG tablet Take 180 mg by mouth daily as needed for allergies or rhinitis.     . Flaxseed, Linseed, (FLAXSEED OIL) 1000 MG CAPS Take 1 capsule by mouth daily.     Marland Kitchen lisinopril (PRINIVIL,ZESTRIL) 5 MG tablet TAKE (1) TABLET  BY MOUTH EVERY DAY 90 tablet 2  . MAGNESIUM PO Take 3 capsules by mouth daily.    . Melatonin 3 MG CAPS Take 1 capsule by mouth at bedtime.    . Multiple Vitamin (MULTIVITAMIN) tablet Take 1 tablet by mouth daily.    . NON FORMULARY AMPK - Take 1 tablet by mouth daily    . NON FORMULARY Super Micro Forte - Take 2 tablets by mouth twice a day    . Omega-3 Fatty Acids (FISH OIL) 1000 MG CAPS Take 2 capsules by mouth 2 (two) times daily.     . Probiotic Product (PROBIOTIC DAILY PO) Take 1 capsule by mouth daily.     . RED YEAST RICE EXTRACT PO Take 1 capsule by mouth daily.     . sildenafil (REVATIO) 20 MG tablet Take 1-5  tablets (20-100 mg total) by mouth daily as needed. 50 tablet 2  . tamsulosin (FLOMAX) 0.4 MG CAPS capsule Take 1 capsule (0.4 mg total) by mouth daily. 90 capsule 1   No current facility-administered medications for this visit.     Allergies:   Patient has no known allergies.    Social History:  The patient  reports that he has been smoking Cigarettes and Cigars.  He has a 30.00 pack-year smoking history. He has never used smokeless tobacco. He reports that he drinks about 0.6 oz of alcohol per week . He reports that he does not use drugs.   Family History:  The patient's family history includes Alcoholism in his brother; Cirrhosis in his brother; Colon cancer in his maternal grandmother; Hypercholesterolemia in his father; Hyperlipidemia in his brother; Hypertension in his brother; Leukemia in his father; Suicidality in his mother.    ROS:  Please see the history of present illness.   Otherwise, review of systems are positive for none.   All other systems are reviewed and negative.    PHYSICAL EXAM: VS:  BP 126/74 (BP Location: Left Arm, Patient Position: Sitting, Cuff Size: Normal)   Pulse 64   Ht 5\' 10"  (1.778 m)   Wt 176 lb (79.8 kg)   BMI 25.25 kg/m  , BMI Body mass index is 25.25 kg/m. GEN: Well nourished, well developed, in no acute distress  HEENT: normal  Neck: no JVD, carotid bruits, or masses Cardiac: Regular with premature beats; no murmurs, rubs, or gallops,no edema  Respiratory:  clear to auscultation bilaterally, normal work of breathing GI: soft, nontender, nondistended, + BS MS: no deformity or atrophy  Skin: warm and dry, no rash Neuro:  Strength and sensation are intact Psych: euthymic mood, full affect   EKG:  EKG is ordered today. EKG personally reviewed by me showed sinus rhythm with frequent PACs and bifascicular block. No QT prolongation.  Recent Labs: No results found for requested labs within last 8760 hours.    Lipid Panel    Component Value  Date/Time   CHOL 185 08/31/2015 0839   TRIG 68 08/31/2015 0839   HDL 61 08/31/2015 0839   CHOLHDL 3.0 08/31/2015 0839   LDLCALC 110 (H) 08/31/2015 0839      Wt Readings from Last 3 Encounters:  12/18/16 176 lb (79.8 kg)  09/02/16 183 lb 4 oz (83.1 kg)  08/05/16 179 lb 8 oz (81.4 kg)       No flowsheet data found.    ASSESSMENT AND PLAN:  1.  Persistent atrial fibrillation: I think the patient is having more episodes of atrial fibrillation on dofetilide although he is  in sinus rhythm today. He is ready to reconsider ablation . I am going to send him back to Dr. Rayann Heman. I explained to him that he needs to be an anticoagulation for at least 3 weeks before ablation but he wants to wait until he sees Dr. Rayann Heman.  CHADS VASc score is 1 but he has strong views against anticoagulation. I requested basic metabolic profile and magnesium level given that he is on dofetilide.  The patient is having episodes of presyncope which could be due to bradycardia. He had this in the past and I recommended a 30 day outpatient monitor but he does not feel his symptoms are frequent enough to warrant this. He will notify us if he develops recurrent episodes.   2. Obstructive sleep apnea: According to his primary care physician, his sleep apnea was overall mild and did not qualify him for CPAP therapy.  3. Tobacco use: The patient continues to smoke.   4. Screening for abdominal aortic aneurysm: I ordered an ultrasound given that he is above 65 and is a smoker.  Disposition:   FU with me in 6 months  Signed,  Kathlyn Sacramento, MD  12/18/2016 8:49 AM    [facility]

## 2016-12-19 LAB — BASIC METABOLIC PANEL
BUN/Creatinine Ratio: 23 (ref 10–24)
BUN: 21 mg/dL (ref 8–27)
CO2: 24 mmol/L (ref 20–29)
CREATININE: 0.9 mg/dL (ref 0.76–1.27)
Calcium: 9 mg/dL (ref 8.6–10.2)
Chloride: 102 mmol/L (ref 96–106)
GFR calc Af Amer: 100 mL/min/{1.73_m2} (ref >59)
GFR calc non Af Amer: 86 mL/min/{1.73_m2} (ref >59)
GLUCOSE: 85 mg/dL (ref 65–99)
Potassium: 5 mmol/L (ref 3.5–5.2)
SODIUM: 138 mmol/L (ref 134–144)

## 2016-12-19 LAB — MAGNESIUM: MAGNESIUM: 2 mg/dL (ref 1.6–2.3)

## 2016-12-22 ENCOUNTER — Ambulatory Visit (INDEPENDENT_AMBULATORY_CARE_PROVIDER_SITE_OTHER): Payer: Medicare HMO | Admitting: Internal Medicine

## 2016-12-22 ENCOUNTER — Encounter: Payer: Self-pay | Admitting: Internal Medicine

## 2016-12-22 VITALS — BP 110/68 | HR 59 | Ht 70.0 in | Wt 175.2 lb

## 2016-12-22 DIAGNOSIS — I4819 Other persistent atrial fibrillation: Secondary | ICD-10-CM

## 2016-12-22 DIAGNOSIS — I481 Persistent atrial fibrillation: Secondary | ICD-10-CM

## 2016-12-22 NOTE — Progress Notes (Signed)
Electrophysiology Office Note   Date:  12/22/2016   ID:  [facility], DOB 10/14/1945, MRN [id number]  PCP:  Steele Sizer, MD  Cardiologist:  Dr Fletcher Anon Primary Electrophysiologist: [name], MD    Chief Complaint  Patient presents with  . Atrial Fibrillation     History of Present Illness: [facility] is a 71 y.o. male who presents today for electrophysiology evaluation.   The patient has symptomatic persistent afib.  He has seen me previously 10/17 and elected to continue medical management.  He recently took generic tikosyn and noticed increasing afib.  He is now back on branded tikosyn and feels somewhat better.  He has had several transient presyncope spells over the past few months of unclear etiology.  He thinks that this may be due to [place] and wishes to consider ablation in hopes to stop tikosyn.  Today, he denies symptoms of palpitations, chest pain, shortness of breath, orthopnea, PND, lower extremity edema, claudication,  syncope, bleeding, or neurologic sequela. The patient is tolerating medications without difficulties and is otherwise without complaint today.    Past Medical History:  Diagnosis Date  . Allergic rhinitis, cause unspecified   . Atrial fibrillation ([place])   . COPD (chronic obstructive pulmonary disease) ([place])    pt denies  . Diverticulosis of colon (without mention of hemorrhage)   . Essential hypertension, benign    pt denies  . Hyperlipidemia   . Impotence of organic origin   . Mild anxiety   . Skin cancer   . Unspecified venous (peripheral) insufficiency    Past Surgical History:  Procedure Laterality Date  . COLONOSCOPY WITH PROPOFOL N/A 11/20/2014   Procedure: COLONOSCOPY WITH PROPOFOL;  Surgeon: Manya Silvas, MD;  Location: [facility] ENDOSCOPY;  Service: Endoscopy;  Laterality: N/A;  . rib cartilage     benign tumor  . SKIN CANCER EXCISION Right 06/2010   lower leg  . TONSILLECTOMY AND ADENOIDECTOMY    . VEIN LIGATION AND  STRIPPING       Current Outpatient Prescriptions  Medication Sig Dispense Refill  . 5-Hydroxytryptophan (5-HTP) 100 MG CAPS Take 1 capsule by mouth at bedtime.     . ALPHA LIPOIC ACID PO Take by mouth as directed.     Marland Kitchen aspirin EC 325 MG tablet Take 1 tablet (325 mg total) by mouth daily. 30 tablet 0  . b complex vitamins capsule Take 1 capsule by mouth daily.    . Cholecalciferol (VITAMIN D PO) Take 7,000 mg by mouth daily.    . Digestive Enzymes (DIGESTIVE ENZYME PO) Take 1 capsule by mouth 3 (three) times daily.     Marland Kitchen diltiazem (CARDIZEM) 30 MG tablet Take 30 mg by mouth 4 (four) times daily as needed (Take as directed).    . dofetilide (TIKOSYN) 125 MCG capsule TAKE 3 CAPSULES TWICE DAILY. 180 capsule 3  . fexofenadine (ALLEGRA) 180 MG tablet Take 180 mg by mouth daily as needed for allergies or rhinitis.     . Flaxseed, Linseed, (FLAXSEED OIL) 1000 MG CAPS Take 1 capsule by mouth daily.     Marland Kitchen lisinopril (PRINIVIL,ZESTRIL) 5 MG tablet TAKE (1) TABLET BY MOUTH EVERY DAY 90 tablet 2  . MAGNESIUM PO Take 3 capsules by mouth daily.    . Melatonin 3 MG CAPS Take 1 capsule by mouth at bedtime.    . Multiple Vitamin (MULTIVITAMIN) tablet Take 1 tablet by mouth daily.    . NON FORMULARY AMPK - Take 1 tablet by  mouth daily    . NON FORMULARY Super Micro Forte - Take 2 tablets by mouth twice a day    . NON FORMULARY Take 1 capsule by mouth as directed. CLA    . Omega-3 Fatty Acids (FISH OIL) 1000 MG CAPS Take 2 capsules by mouth 2 (two) times daily.     . Probiotic Product (PROBIOTIC DAILY PO) Take 1 capsule by mouth daily.     . RED YEAST RICE EXTRACT PO Take 1 capsule by mouth daily.     . sildenafil (REVATIO) 20 MG tablet Take 1-5 tablets (20-100 mg total) by mouth daily as needed. 50 tablet 2  . tamsulosin (FLOMAX) 0.4 MG CAPS capsule Take 1 capsule (0.4 mg total) by mouth daily. 90 capsule 1   No current facility-administered medications for this visit.     Allergies:   Patient has no  known allergies.   Social History:  The patient  reports that he has been smoking Cigarettes and Cigars.  He has a 30.00 pack-year smoking history. He has never used smokeless tobacco. He reports that he drinks about 0.6 oz of alcohol per week . He reports that he does not use drugs.  He works in Art therapist for [employer]   Family History:  The patient's  family history includes Alcoholism in his brother; Cirrhosis in his brother; Colon cancer in his maternal grandmother; Hypercholesterolemia in his father; Hyperlipidemia in his brother; Hypertension in his brother; Leukemia in his father; Suicidality in his mother.    ROS:  Please see the history of present illness.   All other systems are personally reviewed and negative.    PHYSICAL EXAM: VS:  BP 110/68   Pulse (!) 59   Ht 5\' 10"  (1.778 m)   Wt 175 lb 3.2 oz (79.5 kg)   SpO2 97%   BMI 25.14 kg/m  , BMI Body mass index is 25.14 kg/m. GEN: Well nourished, well developed, in no acute distress  HEENT: normal  Neck: no JVD, carotid bruits, or masses Cardiac: RRR; no murmurs, rubs, or gallops,no edema  Respiratory:  clear to auscultation bilaterally, normal work of breathing GI: soft, nontender, nondistended, + BS MS: no deformity or atrophy  Skin: warm and dry  Neuro:  Strength and sensation are intact Psych: euthymic mood, full affect  EKG:  EKG is ordered today. The ekg ordered today is personally reviewed and shows sinus rhythm with PACs, RBBB, LAHB   Recent Labs: 12/18/2016: BUN 21; Creatinine, Ser 0.90; Magnesium 2.0; Potassium 5.0; Sodium 138  personally reviewed   Lipid Panel     Component Value Date/Time   CHOL 185 08/31/2015 0839   TRIG 68 08/31/2015 0839   HDL 61 08/31/2015 0839   CHOLHDL 3.0 08/31/2015 0839   LDLCALC 110 (H) 08/31/2015 0839   personally reviewed   Wt Readings from Last 3 Encounters:  12/22/16 175 lb 3.2 oz (79.5 kg)  12/18/16 176 lb (79.8 kg)  09/02/16 183 lb 4 oz (83.1 kg)       Other studies personally reviewed: Additional studies/ records that were reviewed today include: Dr Tyrell Antonio notes, prior echo  Review of the above records today demonstrates: as above   ASSESSMENT AND PLAN:  1.  Persistent afib Doing reasonably well on tikosyn Wishes to discuss ablation Risk, benefits, and alternatives to EP study and radiofrequency ablation for afib were also discussed in detail today. These risks include but are not limited to stroke, bleeding, vascular damage, tamponade, perforation, damage  to the esophagus, lungs, and other structures, pulmonary vein stenosis, worsening renal function, and death. The patient understands these risk and wishes to proceed.  He wants to wait until November however due to work. Will need to stop ASA and start xarelto 20mg  daily prior to ablation.  Would anticipate cardiac CT several days prior to ablation to exclude LAA thrombus. He will return for further discussions 3-4 weeks prior to ablation  2. Presyncope He has bifascicular block chronically.  Causes for dizziness including post termination pauses, AV block, etc cannot be excluded.  I have advised implantable loop recorder placement given the infrequency of his symptoms.  He is not ready to proceed at this time.   Follow-up:  Return to see me in early October to discuss timing of afib ablation per his preference.  Current medicines are reviewed at length with the patient today.   The patient does not have concerns regarding his medicines.  The following changes were made today:  none    Signed, [name], MD  12/22/2016 11:39 AM     [facility] [address] [place] [address] [phone] (office) [phone] (fax)

## 2016-12-22 NOTE — Patient Instructions (Addendum)
Medication Instructions:  Your physician recommends that you continue on your current medications as directed. Please refer to the Current Medication list given to you today.   Labwork: None ordered  Testing/Procedures:  Your physician has requested that you have cardiac CT. Cardiac computed tomography (CT) is a painless test that uses an x-ray machine to take clear, detailed pictures of your heart. For further information please visit [facility]. Please follow instruction sheet as given.---to be scheduled week of 02/23/17    Your physician has recommended that you have an ablation. Catheter ablation is a medical procedure used to treat some cardiac arrhythmias (irregular heartbeats). During catheter ablation, a long, thin, flexible tube is put into a blood vessel in your groin (upper thigh), or neck. This tube is called an ablation catheter. It is then guided to your heart through the blood vessel. Radio frequency waves destroy small areas of heart tissue where abnormal heartbeats may cause an arrhythmia to start. Please see the instruction sheet given to you today.---03/03/17   Please arrive at [facility] at 5:30am Do not eat or drink after midnight the night prior to the procedure Do not take any medications the morning of the test Plan for one night stay Will need someone to drive you home at discharge     Follow-Up: Your physician recommends that you schedule a follow-up appointment on 02/02/17 with Dr Rayann Heman for pre-op ablation   Thank you for choosing [facility]!!     Janan Halter, RN [phone]

## 2016-12-26 ENCOUNTER — Ambulatory Visit: Payer: Medicare HMO

## 2016-12-26 DIAGNOSIS — Z136 Encounter for screening for cardiovascular disorders: Secondary | ICD-10-CM

## 2016-12-26 DIAGNOSIS — I10 Essential (primary) hypertension: Secondary | ICD-10-CM

## 2016-12-26 LAB — BASIC METABOLIC PANEL
BUN: 19 (ref 4–21)
CREATININE: 0.9 (ref 0.6–1.3)
Glucose: 93
Potassium: 5 (ref 3.4–5.3)
SODIUM: 143 (ref 137–147)

## 2016-12-26 LAB — VITAMIN D 25 HYDROXY (VIT D DEFICIENCY, FRACTURES): Vit D, 25-Hydroxy: 53.7

## 2016-12-26 LAB — LIPID PANEL
Cholesterol: 151 (ref 0–200)
HDL: 52 (ref 35–70)
LDL Cholesterol: 76
TRIGLYCERIDES: 113 (ref 40–160)

## 2016-12-26 LAB — HEPATIC FUNCTION PANEL
ALT: 28 (ref 10–40)
AST: 25 (ref 14–40)
Alkaline Phosphatase: 65 (ref 25–125)

## 2016-12-26 LAB — HEMOGLOBIN A1C: Hemoglobin A1C: 5.4

## 2016-12-26 LAB — TSH: TSH: 1.61 (ref 0.41–5.90)

## 2017-01-13 ENCOUNTER — Telehealth: Payer: Self-pay

## 2017-01-13 ENCOUNTER — Encounter: Payer: Self-pay | Admitting: Family Medicine

## 2017-01-13 NOTE — Telephone Encounter (Signed)
Patient had labs performed at [facility] on 12/26/2016 at 9:32 a.m. Labs that could be added to abstraction part in chart were added and the paper will be scanned in the chart. Patient is requesting Dr. Ancil Boozer review his labs and then put them in his chart. Dr. Ancil Boozer reviewed and states his testosterone is low and needs to see Urologist. Patient was referred this year on May 01, 2016 to Dr. Hollice Espy.

## 2017-01-14 NOTE — Telephone Encounter (Signed)
He did not keep appointment with Urologist.  Can you please contact him and explain how important it is for him to see her.  Thank you

## 2017-01-15 ENCOUNTER — Encounter: Payer: Self-pay | Admitting: Family Medicine

## 2017-01-17 ENCOUNTER — Other Ambulatory Visit: Payer: Self-pay | Admitting: Cardiovascular Disease

## 2017-01-21 ENCOUNTER — Telehealth: Payer: Self-pay

## 2017-01-21 NOTE — Telephone Encounter (Signed)
Patient is going back to [facility] for more extensive PSA testing. Also patient wanted to let you know he is going to follow up with Urology, they had trouble initially with getting the instrument ordered needed for the biopsy. But he is being very proactive about his PSA being elevated and has had this issue since he was 71 years old.

## 2017-01-27 LAB — PSA: PSA: 3.9

## 2017-01-30 DIAGNOSIS — M546 Pain in thoracic spine: Secondary | ICD-10-CM | POA: Diagnosis not present

## 2017-01-30 DIAGNOSIS — M5136 Other intervertebral disc degeneration, lumbar region: Secondary | ICD-10-CM | POA: Diagnosis not present

## 2017-01-30 DIAGNOSIS — M9903 Segmental and somatic dysfunction of lumbar region: Secondary | ICD-10-CM | POA: Diagnosis not present

## 2017-01-30 DIAGNOSIS — M531 Cervicobrachial syndrome: Secondary | ICD-10-CM | POA: Diagnosis not present

## 2017-01-30 DIAGNOSIS — M9902 Segmental and somatic dysfunction of thoracic region: Secondary | ICD-10-CM | POA: Diagnosis not present

## 2017-01-30 DIAGNOSIS — M9901 Segmental and somatic dysfunction of cervical region: Secondary | ICD-10-CM | POA: Diagnosis not present

## 2017-02-02 ENCOUNTER — Encounter: Payer: Self-pay | Admitting: Internal Medicine

## 2017-02-02 ENCOUNTER — Ambulatory Visit (INDEPENDENT_AMBULATORY_CARE_PROVIDER_SITE_OTHER): Payer: Medicare HMO | Admitting: Internal Medicine

## 2017-02-02 VITALS — BP 142/68 | HR 59 | Ht 70.0 in | Wt 179.0 lb

## 2017-02-02 DIAGNOSIS — I481 Persistent atrial fibrillation: Secondary | ICD-10-CM

## 2017-02-02 DIAGNOSIS — I4819 Other persistent atrial fibrillation: Secondary | ICD-10-CM

## 2017-02-02 MED ORDER — RIVAROXABAN 20 MG PO TABS: 20.0000 mg | ORAL_TABLET | Freq: Every day | ORAL | 6 refills | Status: DC

## 2017-02-02 NOTE — Progress Notes (Signed)
PCP: Steele Sizer, MD Primary Cardiologist: Dr Fletcher Anon Primary EP: Dr Vella Kohler is a 71 y.o. male who presents today for routine electrophysiology followup.  Since last being seen in our clinic, the patient reports doing very well.  He continues to have afib. Today, he denies symptoms of palpitations, chest pain, shortness of breath,  lower extremity edema, dizziness, presyncope, or syncope.  The patient is otherwise without complaint today.   Past Medical History:  Diagnosis Date  . Allergic rhinitis, cause unspecified   . Atrial fibrillation ([place])   . COPD (chronic obstructive pulmonary disease) ([place])    pt denies  . Diverticulosis of colon (without mention of hemorrhage)   . Essential hypertension, benign    pt denies  . Hyperlipidemia   . Impotence of organic origin   . Mild anxiety   . Skin cancer   . Unspecified venous (peripheral) insufficiency    Past Surgical History:  Procedure Laterality Date  . COLONOSCOPY WITH PROPOFOL N/A 11/20/2014   Procedure: COLONOSCOPY WITH PROPOFOL;  Surgeon: Manya Silvas, MD;  Location: [facility] ENDOSCOPY;  Service: Endoscopy;  Laterality: N/A;  . rib cartilage     benign tumor  . SKIN CANCER EXCISION Right 06/2010   lower leg  . TONSILLECTOMY AND ADENOIDECTOMY    . VEIN LIGATION AND STRIPPING      ROS- all systems are reviewed and negatives except as per HPI above  Current Outpatient Prescriptions  Medication Sig Dispense Refill  . 5-Hydroxytryptophan (5-HTP) 100 MG CAPS Take 1 capsule by mouth at bedtime.     . ALPHA LIPOIC ACID PO Take by mouth as directed.     Marland Kitchen aspirin EC 325 MG tablet Take 1 tablet (325 mg total) by mouth daily. 30 tablet 0  . b complex vitamins capsule Take 1 capsule by mouth daily.    . Cholecalciferol (VITAMIN D PO) Take 7,000 mg by mouth daily.    . Digestive Enzymes (DIGESTIVE ENZYME PO) Take 1 capsule by mouth 3 (three) times daily.     Marland Kitchen diltiazem (CARDIZEM) 30 MG tablet Take 30 mg by mouth  4 (four) times daily as needed (Take as directed).    . dofetilide (TIKOSYN) 125 MCG capsule TAKE 3 CAPSULES TWICE DAILY. 180 capsule 3  . fexofenadine (ALLEGRA) 180 MG tablet Take 180 mg by mouth daily as needed for allergies or rhinitis.     . Flaxseed, Linseed, (FLAXSEED OIL) 1000 MG CAPS Take 1 capsule by mouth daily.     Marland Kitchen lisinopril (PRINIVIL,ZESTRIL) 5 MG tablet TAKE (1) TABLET BY MOUTH EVERY DAY 90 tablet 2  . MAGNESIUM PO Take 3 capsules by mouth daily.    . Melatonin 3 MG CAPS Take 1 capsule by mouth at bedtime.    . Multiple Vitamin (MULTIVITAMIN) tablet Take 1 tablet by mouth daily.    . NON FORMULARY AMPK - Take 1 tablet by mouth daily    . NON FORMULARY Super Micro Forte - Take 2 tablets by mouth twice a day    . NON FORMULARY Take 1 capsule by mouth as directed. CLA    . Omega-3 Fatty Acids (FISH OIL) 1000 MG CAPS Take 2 capsules by mouth 2 (two) times daily.     . Probiotic Product (PROBIOTIC DAILY PO) Take 1 capsule by mouth daily.     . RED YEAST RICE EXTRACT PO Take 1 capsule by mouth daily.     . sildenafil (REVATIO) 20 MG tablet Take 1-5 tablets (  20-100 mg total) by mouth daily as needed. 50 tablet 2  . tamsulosin (FLOMAX) 0.4 MG CAPS capsule Take 1 capsule (0.4 mg total) by mouth daily. 90 capsule 1   No current facility-administered medications for this visit.     Physical Exam: Vitals:   02/02/17 1042  BP: (!) 142/68  Pulse: (!) 59  SpO2: 96%  Weight: 179 lb (81.2 kg)  Height: 5\' 10"  (1.778 m)    GEN- The patient is well appearing, alert and oriented x 3 today.   Head- normocephalic, atraumatic Eyes-  Sclera clear, conjunctiva pink Ears- hearing intact Oropharynx- clear Lungs- Clear to ausculation bilaterally, normal work of breathing Heart- Regular rate and rhythm, no murmurs, rubs or gallops, PMI not laterally displaced GI- soft, NT, ND, + BS Extremities- no clubbing, cyanosis, or edema  EKG tracing ordered today is personally reviewed and shows sinus  rhythm 59 bpm, LAHB, RBBB, Qtc 481 msec  Assessment and Plan:  1. Persistent afib Continues to have afib despite tikosyn Therapeutic strategies for afib including medicine and ablation were discussed in detail with the patient today. Risk, benefits, and alternatives to EP study and radiofrequency ablation for afib were also discussed in detail today. These risks include but are not limited to stroke, bleeding, vascular damage, tamponade, perforation, damage to the esophagus, lungs, and other structures, pulmonary vein stenosis, worsening renal function, and death. The patient understands these risk and wishes to proceed.  We will therefore proceed with catheter ablation at the next available time. Stop ASA and start xarelto 20mg  daily today.  Cardiac CT is required prior to ablation.  Carto/ ICE/ anesthesia is requested.   [name] MD, [facility] 02/02/2017 10:57 AM

## 2017-02-02 NOTE — Patient Instructions (Addendum)
Medication Instructions:  Your physician has recommended you make the following change in your medication:   1.)  Stop Aspirin  2.) Xarelto 20mg  daily   Labwork: Your physician recommends that you return for lab work on:--- 02/20/17 @ 10:00am. (CBC/BMP).  You do not have to fast    Testing/Procedures: Your physician has requested that you have cardiac CT to be scheduled the week of 02/23/17. Cardiac computed tomography (CT) is a painless test that uses an x-ray machine to take clear, detailed pictures of your heart. For further information please visit [email or web address]. Please follow instruction sheet as given. Office will call once approved by insurance.   Please arrive at the [facility] main entrance of [facility] at xx:xx AM (30-45 minutes prior to test start time)  [facility] [address] [place], [place] [address] [phone]  Proceed to the [facility] Radiology Department (First Floor).  Please follow these instructions carefully (unless otherwise directed):  Hold all erectile dysfunction medications at least 48 hours prior to test.  On the Night Before the Test: . Drink plenty of water. . Do not consume any caffeinated/decaffeinated beverages or chocolate 12 hours prior to your test. . Do not take any antihistamines 12 hours prior to your test. . If you take Metformin do not take 24 hours prior to test.  On the Day of the Test: . Drink plenty of water. Do not drink any water within one hour of the test. . Do not eat any food 4 hours prior to the test. . You may take your regular medications prior to the test. . HOLD Furosemide morning of the test.  After the Test: . Drink plenty of water. . After receiving IV contrast, you may experience a mild flushed feeling. This is normal. . On occasion, you may experience a mild rash up to 24 hours after the test. This is not dangerous. If this occurs, you can take Benadryl 25 mg and increase  your fluid intake. . If you experience trouble breathing, this can be serious. If it is severe call 911 IMMEDIATELY. If it is mild, please call our office. . If you take any of these medications: Glipizide/Metformin, Avandament, Glucavance, please do not take 48 hours after completing test.  Your physician has recommended that you have an ablation. Catheter ablation is a medical procedure used to treat some cardiac arrhythmias (irregular heartbeats). During catheter ablation, a long, thin, flexible tube is put into a blood vessel in your groin (upper thigh), or neck. This tube is called an ablation catheter. It is then guided to your heart through the blood vessel. Radio frequency waves destroy small areas of heart tissue where abnormal heartbeats may cause an arrhythmia to start. Please see the instruction sheet given to you today. ---03/03/17  Please arrive at [facility] at 5:30am Do not eat or drink after midnight the night prior to the procedure Do not take any medications the morning of the test Plan for one night stay Will need someone to drive you home at discharge    Follow-Up: Your physician recommends that you schedule a follow-up appointment in: 4 weeks from 03/03/17 with Afib clinic and 3 months from 03/03/17 with Dr. Rayann Heman.   Any Other Special Instructions Will Be Listed Below (If Applicable).     If you need a refill on your cardiac medications before your next appointment, please call your pharmacy.

## 2017-02-02 NOTE — H&P (View-Only) (Signed)
PCP: Steele Sizer, MD Primary Cardiologist: Dr Fletcher Anon Primary EP: Dr Vella Kohler is a 71 y.o. male who presents today for routine electrophysiology followup.  Since last being seen in our clinic, the patient reports doing very well.  He continues to have afib. Today, he denies symptoms of palpitations, chest pain, shortness of breath,  lower extremity edema, dizziness, presyncope, or syncope.  The patient is otherwise without complaint today.   Past Medical History:  Diagnosis Date  . Allergic rhinitis, cause unspecified   . Atrial fibrillation ([place])   . COPD (chronic obstructive pulmonary disease) ([place])    pt denies  . Diverticulosis of colon (without mention of hemorrhage)   . Essential hypertension, benign    pt denies  . Hyperlipidemia   . Impotence of organic origin   . Mild anxiety   . Skin cancer   . Unspecified venous (peripheral) insufficiency    Past Surgical History:  Procedure Laterality Date  . COLONOSCOPY WITH PROPOFOL N/A 11/20/2014   Procedure: COLONOSCOPY WITH PROPOFOL;  Surgeon: Manya Silvas, MD;  Location: [facility] ENDOSCOPY;  Service: Endoscopy;  Laterality: N/A;  . rib cartilage     benign tumor  . SKIN CANCER EXCISION Right 06/2010   lower leg  . TONSILLECTOMY AND ADENOIDECTOMY    . VEIN LIGATION AND STRIPPING      ROS- all systems are reviewed and negatives except as per HPI above  Current Outpatient Prescriptions  Medication Sig Dispense Refill  . 5-Hydroxytryptophan (5-HTP) 100 MG CAPS Take 1 capsule by mouth at bedtime.     . ALPHA LIPOIC ACID PO Take by mouth as directed.     Marland Kitchen aspirin EC 325 MG tablet Take 1 tablet (325 mg total) by mouth daily. 30 tablet 0  . b complex vitamins capsule Take 1 capsule by mouth daily.    . Cholecalciferol (VITAMIN D PO) Take 7,000 mg by mouth daily.    . Digestive Enzymes (DIGESTIVE ENZYME PO) Take 1 capsule by mouth 3 (three) times daily.     Marland Kitchen diltiazem (CARDIZEM) 30 MG tablet Take 30 mg by mouth  4 (four) times daily as needed (Take as directed).    . dofetilide (TIKOSYN) 125 MCG capsule TAKE 3 CAPSULES TWICE DAILY. 180 capsule 3  . fexofenadine (ALLEGRA) 180 MG tablet Take 180 mg by mouth daily as needed for allergies or rhinitis.     . Flaxseed, Linseed, (FLAXSEED OIL) 1000 MG CAPS Take 1 capsule by mouth daily.     Marland Kitchen lisinopril (PRINIVIL,ZESTRIL) 5 MG tablet TAKE (1) TABLET BY MOUTH EVERY DAY 90 tablet 2  . MAGNESIUM PO Take 3 capsules by mouth daily.    . Melatonin 3 MG CAPS Take 1 capsule by mouth at bedtime.    . Multiple Vitamin (MULTIVITAMIN) tablet Take 1 tablet by mouth daily.    . NON FORMULARY AMPK - Take 1 tablet by mouth daily    . NON FORMULARY Super Micro Forte - Take 2 tablets by mouth twice a day    . NON FORMULARY Take 1 capsule by mouth as directed. CLA    . Omega-3 Fatty Acids (FISH OIL) 1000 MG CAPS Take 2 capsules by mouth 2 (two) times daily.     . Probiotic Product (PROBIOTIC DAILY PO) Take 1 capsule by mouth daily.     . RED YEAST RICE EXTRACT PO Take 1 capsule by mouth daily.     . sildenafil (REVATIO) 20 MG tablet Take 1-5 tablets (  20-100 mg total) by mouth daily as needed. 50 tablet 2  . tamsulosin (FLOMAX) 0.4 MG CAPS capsule Take 1 capsule (0.4 mg total) by mouth daily. 90 capsule 1   No current facility-administered medications for this visit.     Physical Exam: Vitals:   02/02/17 1042  BP: (!) 142/68  Pulse: (!) 59  SpO2: 96%  Weight: 179 lb (81.2 kg)  Height: 5\' 10"  (1.778 m)    GEN- The patient is well appearing, alert and oriented x 3 today.   Head- normocephalic, atraumatic Eyes-  Sclera clear, conjunctiva pink Ears- hearing intact Oropharynx- clear Lungs- Clear to ausculation bilaterally, normal work of breathing Heart- Regular rate and rhythm, no murmurs, rubs or gallops, PMI not laterally displaced GI- soft, NT, ND, + BS Extremities- no clubbing, cyanosis, or edema  EKG tracing ordered today is personally reviewed and shows sinus  rhythm 59 bpm, LAHB, RBBB, Qtc 481 msec  Assessment and Plan:  1. Persistent afib Continues to have afib despite tikosyn Therapeutic strategies for afib including medicine and ablation were discussed in detail with the patient today. Risk, benefits, and alternatives to EP study and radiofrequency ablation for afib were also discussed in detail today. These risks include but are not limited to stroke, bleeding, vascular damage, tamponade, perforation, damage to the esophagus, lungs, and other structures, pulmonary vein stenosis, worsening renal function, and death. The patient understands these risk and wishes to proceed.  We will therefore proceed with catheter ablation at the next available time. Stop ASA and start xarelto 20mg  daily today.  Cardiac CT is required prior to ablation.  Carto/ ICE/ anesthesia is requested.   [name] MD, [facility] 02/02/2017 10:57 AM

## 2017-02-03 ENCOUNTER — Encounter: Payer: Self-pay | Admitting: Family Medicine

## 2017-02-03 NOTE — Progress Notes (Unsigned)
01/26/2017  

## 2017-02-05 ENCOUNTER — Encounter: Payer: Self-pay | Admitting: Family Medicine

## 2017-02-06 ENCOUNTER — Other Ambulatory Visit: Payer: Self-pay | Admitting: Family Medicine

## 2017-02-06 DIAGNOSIS — N401 Enlarged prostate with lower urinary tract symptoms: Principal | ICD-10-CM

## 2017-02-06 DIAGNOSIS — N138 Other obstructive and reflux uropathy: Secondary | ICD-10-CM

## 2017-02-12 ENCOUNTER — Encounter: Payer: Self-pay | Admitting: Internal Medicine

## 2017-02-16 DIAGNOSIS — R69 Illness, unspecified: Secondary | ICD-10-CM | POA: Diagnosis not present

## 2017-02-20 ENCOUNTER — Other Ambulatory Visit: Payer: Medicare HMO | Admitting: *Deleted

## 2017-02-20 DIAGNOSIS — I5022 Chronic systolic (congestive) heart failure: Secondary | ICD-10-CM

## 2017-02-20 DIAGNOSIS — I872 Venous insufficiency (chronic) (peripheral): Secondary | ICD-10-CM | POA: Diagnosis not present

## 2017-02-20 DIAGNOSIS — I480 Paroxysmal atrial fibrillation: Secondary | ICD-10-CM | POA: Diagnosis not present

## 2017-02-20 DIAGNOSIS — I452 Bifascicular block: Secondary | ICD-10-CM

## 2017-02-20 DIAGNOSIS — E7800 Pure hypercholesterolemia, unspecified: Secondary | ICD-10-CM

## 2017-02-20 LAB — CBC WITH DIFFERENTIAL/PLATELET
BASOS: 0 %
Basophils Absolute: 0 10*3/uL (ref 0.0–0.2)
EOS (ABSOLUTE): 0.2 10*3/uL (ref 0.0–0.4)
Eos: 3 %
HEMATOCRIT: 43.6 % (ref 37.5–51.0)
HEMOGLOBIN: 15.3 g/dL (ref 13.0–17.7)
IMMATURE GRANS (ABS): 0 10*3/uL (ref 0.0–0.1)
Immature Granulocytes: 0 %
LYMPHS: 15 %
Lymphocytes Absolute: 0.9 10*3/uL (ref 0.7–3.1)
MCH: 31.7 pg (ref 26.6–33.0)
MCHC: 35.1 g/dL (ref 31.5–35.7)
MCV: 91 fL (ref 79–97)
MONOCYTES: 9 %
Monocytes Absolute: 0.6 10*3/uL (ref 0.1–0.9)
NEUTROS ABS: 4.7 10*3/uL (ref 1.4–7.0)
Neutrophils: 73 %
Platelets: 260 10*3/uL (ref 150–379)
RBC: 4.82 x10E6/uL (ref 4.14–5.80)
RDW: 13.4 % (ref 12.3–15.4)
WBC: 6.4 10*3/uL (ref 3.4–10.8)

## 2017-02-20 LAB — BASIC METABOLIC PANEL
BUN/Creatinine Ratio: 31 — ABNORMAL HIGH (ref 10–24)
BUN: 25 mg/dL (ref 8–27)
CALCIUM: 9.8 mg/dL (ref 8.6–10.2)
CO2: 25 mmol/L (ref 20–29)
Chloride: 103 mmol/L (ref 96–106)
Creatinine, Ser: 0.81 mg/dL (ref 0.76–1.27)
GFR calc Af Amer: 104 mL/min/{1.73_m2} (ref >59)
GFR calc non Af Amer: 90 mL/min/{1.73_m2} (ref >59)
Glucose: 92 mg/dL (ref 65–99)
POTASSIUM: 4.4 mmol/L (ref 3.5–5.2)
Sodium: 142 mmol/L (ref 134–144)

## 2017-02-20 NOTE — Addendum Note (Signed)
Addended by: Eulis Foster on: 02/20/2017 11:22 AM   Modules accepted: Orders

## 2017-02-25 ENCOUNTER — Encounter (HOSPITAL_COMMUNITY): Payer: Self-pay

## 2017-02-25 ENCOUNTER — Ambulatory Visit (HOSPITAL_COMMUNITY)
Admission: RE | Admit: 2017-02-25 | Discharge: 2017-02-25 | Disposition: A | Discharge status: Home / Self Care | Payer: Medicare HMO | Source: Ambulatory Visit | Attending: Internal Medicine | Admitting: Internal Medicine

## 2017-02-25 DIAGNOSIS — I4819 Other persistent atrial fibrillation: Secondary | ICD-10-CM

## 2017-02-25 DIAGNOSIS — I4891 Unspecified atrial fibrillation: Secondary | ICD-10-CM | POA: Diagnosis not present

## 2017-02-25 DIAGNOSIS — I481 Persistent atrial fibrillation: Secondary | ICD-10-CM | POA: Diagnosis not present

## 2017-02-25 HISTORY — DX: Heart failure, unspecified: I50.9

## 2017-02-25 MED ORDER — IOPAMIDOL (ISOVUE-370) INJECTION 76%
INTRAVENOUS | Status: AC
  Administered 2017-02-25: 80 mL
  Filled 2017-02-25: qty 100

## 2017-03-03 ENCOUNTER — Encounter (HOSPITAL_COMMUNITY): Payer: Self-pay | Admitting: Internal Medicine

## 2017-03-03 ENCOUNTER — Ambulatory Visit (HOSPITAL_COMMUNITY): Payer: Medicare HMO | Admitting: Anesthesiology

## 2017-03-03 ENCOUNTER — Other Ambulatory Visit: Payer: Self-pay

## 2017-03-03 ENCOUNTER — Ambulatory Visit (HOSPITAL_COMMUNITY)
Admission: RE | Admit: 2017-03-03 | Discharge: 2017-03-04 | Disposition: A | Discharge status: Home / Self Care | Payer: Medicare HMO | Source: Ambulatory Visit | Attending: Internal Medicine | Admitting: Internal Medicine

## 2017-03-03 ENCOUNTER — Encounter (HOSPITAL_COMMUNITY)
Admission: RE | Disposition: A | Discharge status: Home / Self Care | Payer: Self-pay | Source: Ambulatory Visit | Attending: Internal Medicine

## 2017-03-03 DIAGNOSIS — Z7983 Long term (current) use of bisphosphonates: Secondary | ICD-10-CM | POA: Diagnosis not present

## 2017-03-03 DIAGNOSIS — F17210 Nicotine dependence, cigarettes, uncomplicated: Secondary | ICD-10-CM | POA: Insufficient documentation

## 2017-03-03 DIAGNOSIS — I481 Persistent atrial fibrillation: Secondary | ICD-10-CM | POA: Insufficient documentation

## 2017-03-03 DIAGNOSIS — J449 Chronic obstructive pulmonary disease, unspecified: Secondary | ICD-10-CM | POA: Diagnosis not present

## 2017-03-03 DIAGNOSIS — Z7982 Long term (current) use of aspirin: Secondary | ICD-10-CM | POA: Diagnosis not present

## 2017-03-03 DIAGNOSIS — I10 Essential (primary) hypertension: Secondary | ICD-10-CM | POA: Diagnosis not present

## 2017-03-03 DIAGNOSIS — Z79899 Other long term (current) drug therapy: Secondary | ICD-10-CM | POA: Diagnosis not present

## 2017-03-03 DIAGNOSIS — F419 Anxiety disorder, unspecified: Secondary | ICD-10-CM | POA: Diagnosis not present

## 2017-03-03 DIAGNOSIS — I480 Paroxysmal atrial fibrillation: Secondary | ICD-10-CM | POA: Diagnosis not present

## 2017-03-03 DIAGNOSIS — E785 Hyperlipidemia, unspecified: Secondary | ICD-10-CM | POA: Insufficient documentation

## 2017-03-03 DIAGNOSIS — I4892 Unspecified atrial flutter: Secondary | ICD-10-CM | POA: Insufficient documentation

## 2017-03-03 DIAGNOSIS — R69 Illness, unspecified: Secondary | ICD-10-CM | POA: Diagnosis not present

## 2017-03-03 DIAGNOSIS — Z85828 Personal history of other malignant neoplasm of skin: Secondary | ICD-10-CM | POA: Diagnosis not present

## 2017-03-03 DIAGNOSIS — I4891 Unspecified atrial fibrillation: Secondary | ICD-10-CM | POA: Diagnosis not present

## 2017-03-03 DIAGNOSIS — Z7901 Long term (current) use of anticoagulants: Secondary | ICD-10-CM | POA: Insufficient documentation

## 2017-03-03 HISTORY — PX: ATRIAL FIBRILLATION ABLATION: EP1191

## 2017-03-03 LAB — POCT ACTIVATED CLOTTING TIME
ACTIVATED CLOTTING TIME: 290 s
ACTIVATED CLOTTING TIME: 296 s
ACTIVATED CLOTTING TIME: 334 s
Activated Clotting Time: 186 seconds

## 2017-03-03 SURGERY — ATRIAL FIBRILLATION ABLATION
Anesthesia: General

## 2017-03-03 MED ORDER — HEPARIN SODIUM (PORCINE) 1000 UNIT/ML IJ SOLN
INTRAMUSCULAR | Status: DC | PRN
  Administered 2017-03-03: 5000 [IU] via INTRAVENOUS
  Administered 2017-03-03: 2000 [IU] via INTRAVENOUS

## 2017-03-03 MED ORDER — MELATONIN 3 MG PO TABS
3.0000 mg | ORAL_TABLET | Freq: Every day | ORAL | Status: DC
  Administered 2017-03-03: 3 mg via ORAL
  Filled 2017-03-03: qty 1

## 2017-03-03 MED ORDER — HYDROCODONE-ACETAMINOPHEN 5-325 MG PO TABS: 1.0000 | ORAL_TABLET | ORAL | Status: DC | PRN

## 2017-03-03 MED ORDER — PROPOFOL 10 MG/ML IV BOLUS
INTRAVENOUS | Status: DC | PRN
  Administered 2017-03-03: 150 mg via INTRAVENOUS
  Administered 2017-03-03: 100 mg via INTRAVENOUS

## 2017-03-03 MED ORDER — FENTANYL CITRATE (PF) 100 MCG/2ML IJ SOLN
INTRAMUSCULAR | Status: DC | PRN
  Administered 2017-03-03 (×2): 50 ug via INTRAVENOUS
  Administered 2017-03-03 (×2): 25 ug via INTRAVENOUS
  Administered 2017-03-03: 50 ug via INTRAVENOUS

## 2017-03-03 MED ORDER — LISINOPRIL 5 MG PO TABS
5.0000 mg | ORAL_TABLET | Freq: Every day | ORAL | Status: DC
  Administered 2017-03-04: 5 mg via ORAL
  Filled 2017-03-03: qty 1

## 2017-03-03 MED ORDER — FINASTERIDE 5 MG PO TABS
5.0000 mg | ORAL_TABLET | Freq: Every day | ORAL | Status: DC
  Administered 2017-03-04: 5 mg via ORAL
  Filled 2017-03-03: qty 1

## 2017-03-03 MED ORDER — IOPAMIDOL (ISOVUE-370) INJECTION 76%
INTRAVENOUS | Status: AC
  Filled 2017-03-03: qty 50

## 2017-03-03 MED ORDER — 5-HTP 100 MG PO CAPS: 100.0000 mg | ORAL_CAPSULE | Freq: Every day | ORAL | Status: DC

## 2017-03-03 MED ORDER — MIDAZOLAM HCL 5 MG/5ML IJ SOLN
INTRAMUSCULAR | Status: DC | PRN
  Administered 2017-03-03: 2 mg via INTRAVENOUS

## 2017-03-03 MED ORDER — SODIUM CHLORIDE 0.9% FLUSH: 3.0000 mL | INTRAVENOUS | Status: DC | PRN

## 2017-03-03 MED ORDER — SODIUM CHLORIDE 0.9% FLUSH
3.0000 mL | Freq: Two times a day (BID) | INTRAVENOUS | Status: DC
  Administered 2017-03-03 – 2017-03-04 (×2): 3 mL via INTRAVENOUS

## 2017-03-03 MED ORDER — PROTAMINE SULFATE 10 MG/ML IV SOLN
INTRAVENOUS | Status: DC | PRN
  Administered 2017-03-03: 30 mg via INTRAVENOUS

## 2017-03-03 MED ORDER — IOPAMIDOL (ISOVUE-370) INJECTION 76%
INTRAVENOUS | Status: DC | PRN
  Administered 2017-03-03: 2 mL via INTRAVENOUS

## 2017-03-03 MED ORDER — GUAIFENESIN-DM 100-10 MG/5ML PO SYRP
5.0000 mL | ORAL_SOLUTION | ORAL | Status: DC | PRN
  Administered 2017-03-03: 5 mL via ORAL
  Filled 2017-03-03 (×2): qty 5

## 2017-03-03 MED ORDER — ACETAMINOPHEN 325 MG PO TABS
650.0000 mg | ORAL_TABLET | ORAL | Status: DC | PRN
  Administered 2017-03-04: 650 mg via ORAL
  Filled 2017-03-03: qty 2

## 2017-03-03 MED ORDER — TAMSULOSIN HCL 0.4 MG PO CAPS
0.4000 mg | ORAL_CAPSULE | Freq: Every day | ORAL | Status: DC
  Administered 2017-03-03: 0.4 mg via ORAL
  Filled 2017-03-03: qty 1

## 2017-03-03 MED ORDER — PHENYLEPHRINE HCL 10 MG/ML IJ SOLN
INTRAMUSCULAR | Status: DC | PRN
  Administered 2017-03-03: 40 ug/min via INTRAVENOUS

## 2017-03-03 MED ORDER — DIGESTIVE ENZYME PO CAPS: ORAL_CAPSULE | Freq: Three times a day (TID) | ORAL | Status: DC

## 2017-03-03 MED ORDER — DEXTROSE 5 % IV SOLN
INTRAVENOUS | Status: DC | PRN
  Administered 2017-03-03: 10 ug/min via INTRAVENOUS

## 2017-03-03 MED ORDER — DOFETILIDE 250 MCG PO CAPS
375.0000 ug | ORAL_CAPSULE | Freq: Two times a day (BID) | ORAL | Status: DC
  Administered 2017-03-03 – 2017-03-04 (×2): 375 ug via ORAL
  Filled 2017-03-03 (×2): qty 1

## 2017-03-03 MED ORDER — SODIUM CHLORIDE 0.9 % IV SOLN
INTRAVENOUS | Status: DC
  Administered 2017-03-03 (×2): via INTRAVENOUS

## 2017-03-03 MED ORDER — DEXAMETHASONE SODIUM PHOSPHATE 4 MG/ML IJ SOLN
INTRAMUSCULAR | Status: DC | PRN
  Administered 2017-03-03: 8 mg via INTRAVENOUS

## 2017-03-03 MED ORDER — RIVAROXABAN 20 MG PO TABS
20.0000 mg | ORAL_TABLET | Freq: Every day | ORAL | Status: DC
  Administered 2017-03-03: 20 mg via ORAL
  Filled 2017-03-03: qty 1

## 2017-03-03 MED ORDER — MAGNESIUM OXIDE 400 (241.3 MG) MG PO TABS
400.0000 mg | ORAL_TABLET | Freq: Every day | ORAL | Status: DC
  Administered 2017-03-03: 400 mg via ORAL
  Filled 2017-03-03: qty 1

## 2017-03-03 MED ORDER — ISOPROTERENOL HCL 0.2 MG/ML IJ SOLN
INTRAMUSCULAR | Status: AC
  Filled 2017-03-03: qty 5

## 2017-03-03 MED ORDER — BUPIVACAINE HCL (PF) 0.25 % IJ SOLN
INTRAMUSCULAR | Status: AC
  Filled 2017-03-03: qty 30

## 2017-03-03 MED ORDER — ONDANSETRON HCL 4 MG/2ML IJ SOLN
INTRAMUSCULAR | Status: DC | PRN
  Administered 2017-03-03: 4 mg via INTRAVENOUS

## 2017-03-03 MED ORDER — HEPARIN SODIUM (PORCINE) 1000 UNIT/ML IJ SOLN
INTRAMUSCULAR | Status: DC | PRN
  Administered 2017-03-03 (×2): 1000 [IU] via INTRAVENOUS
  Administered 2017-03-03: 12000 [IU] via INTRAVENOUS

## 2017-03-03 MED ORDER — DIPHENHYDRAMINE HCL 25 MG PO CAPS
25.0000 mg | ORAL_CAPSULE | Freq: Once | ORAL | Status: AC | PRN
  Administered 2017-03-03: 25 mg via ORAL
  Filled 2017-03-03: qty 1

## 2017-03-03 MED ORDER — HEPARIN SODIUM (PORCINE) 1000 UNIT/ML IJ SOLN
INTRAMUSCULAR | Status: AC
  Filled 2017-03-03: qty 1

## 2017-03-03 MED ORDER — SODIUM CHLORIDE 0.9 % IV SOLN: 250.0000 mL | INTRAVENOUS | Status: DC | PRN

## 2017-03-03 MED ORDER — LIDOCAINE HCL (CARDIAC) 20 MG/ML IV SOLN
INTRAVENOUS | Status: DC | PRN
  Administered 2017-03-03: 80 mg via INTRAVENOUS
  Administered 2017-03-03: 30 mg via INTRAVENOUS

## 2017-03-03 MED ORDER — ONDANSETRON HCL 4 MG/2ML IJ SOLN: 4.0000 mg | Freq: Four times a day (QID) | INTRAMUSCULAR | Status: DC | PRN

## 2017-03-03 MED ORDER — BUPIVACAINE HCL (PF) 0.25 % IJ SOLN
INTRAMUSCULAR | Status: DC | PRN
  Administered 2017-03-03: 30 mL

## 2017-03-03 SURGICAL SUPPLY — 15 items
BLANKET WARM UNDERBOD FULL ACC (MISCELLANEOUS) ×2 IMPLANT
CATH NAVISTAR SMARTTOUCH DF (ABLATOR) ×2 IMPLANT
CATH SOUNDSTAR 3D IMAGING (CATHETERS) ×2 IMPLANT
CATH VARIABLE LASSO NAV 2515 (CATHETERS) ×2 IMPLANT
CATH WEBSTER BI DIR CS D-F CRV (CATHETERS) ×2 IMPLANT
NEEDLE TRANSEP BRK 71CM 407200 (NEEDLE) ×2 IMPLANT
PACK EP LATEX FREE (CUSTOM PROCEDURE TRAY) ×1
PACK EP LF (CUSTOM PROCEDURE TRAY) ×1 IMPLANT
PAD DEFIB LIFELINK (PAD) ×2 IMPLANT
PATCH CARTO3 (PAD) ×2 IMPLANT
SHEATH AVANTI 11F 11CM (SHEATH) ×2 IMPLANT
SHEATH PINNACLE 7F 10CM (SHEATH) ×4 IMPLANT
SHEATH PINNACLE 9F 10CM (SHEATH) ×2 IMPLANT
SHEATH SWARTZ TS SL2 63CM 8.5F (SHEATH) ×2 IMPLANT
TUBING SMART ABLATE COOLFLOW (TUBING) ×2 IMPLANT

## 2017-03-03 NOTE — Plan of Care (Signed)
Patient aware of plan of care.  RN provided medication on medications administered prior to administration.  Patient stated understanding. Patient denying pain.  RN instructed patient to notify RN if patient started to experience any pain.  Patient agreeable to RN instruction.

## 2017-03-03 NOTE — Anesthesia Procedure Notes (Signed)
Procedure Name: LMA Insertion Date/Time: 03/03/2017 7:52 AM Performed by: Izora Gala, CRNA Pre-anesthesia Checklist: Patient identified, Emergency Drugs available, Suction available and Patient being monitored Patient Re-evaluated:Patient Re-evaluated prior to induction Oxygen Delivery Method: Circle system utilized Preoxygenation: Pre-oxygenation with 100% oxygen Induction Type: IV induction Ventilation: Mask ventilation without difficulty LMA: LMA inserted LMA Size: 5.0 Number of attempts: 1 Placement Confirmation: positive ETCO2 and breath sounds checked- equal and bilateral Tube secured with: Tape Dental Injury: Teeth and Oropharynx as per pre-operative assessment

## 2017-03-03 NOTE — Transfer of Care (Signed)
Immediate Anesthesia Transfer of Care Note  Patient: Steven Griffin  Procedure(s) Performed: Atrial Fibrillation Ablation (N/A )  Patient Location: PACU  Anesthesia Type:General  Level of Consciousness: awake, alert  and patient cooperative  Airway & Oxygen Therapy: Patient Spontanous Breathing and Patient connected to nasal cannula oxygen  Post-op Assessment: Report given to RN, Post -op Vital signs reviewed and stable and Patient moving all extremities  Post vital signs: Reviewed and stable  Last Vitals:  Vitals:   03/03/17 0550  BP: 121/65  Pulse: (!) 58  Temp: 36.4 C  SpO2: 95%    Last Pain:  Vitals:   03/03/17 0550  TempSrc: Oral         Complications: No apparent anesthesia complications

## 2017-03-03 NOTE — Anesthesia Preprocedure Evaluation (Signed)
Anesthesia Evaluation  Patient identified by MRN, date of birth, ID band Patient awake    Reviewed: Allergy & Precautions, NPO status , Patient's Chart, lab work & pertinent test results  Airway Mallampati: II  TM Distance: >3 FB Neck ROM: Full    Dental  (+) Teeth Intact, Dental Advisory Given   Pulmonary Current Smoker,    breath sounds clear to auscultation       Cardiovascular hypertension,  Rhythm:Regular Rate:Normal     Neuro/Psych    GI/Hepatic   Endo/Other    Renal/GU      Musculoskeletal   Abdominal   Peds  Hematology   Anesthesia Other Findings   Reproductive/Obstetrics                             Anesthesia Physical Anesthesia Plan  ASA: III  Anesthesia Plan: General   Post-op Pain Management:    Induction: Intravenous  PONV Risk Score and Plan: 1 and Ondansetron and Dexamethasone  Airway Management Planned: Oral ETT  Additional Equipment:   Intra-op Plan:   Post-operative Plan: Extubation in OR  Informed Consent: I have reviewed the patients History and Physical, chart, labs and discussed the procedure including the risks, benefits and alternatives for the proposed anesthesia with the patient or authorized representative who has indicated his/her understanding and acceptance.   Dental advisory given  Plan Discussed with: CRNA and Anesthesiologist  Anesthesia Plan Comments:         Anesthesia Quick Evaluation

## 2017-03-03 NOTE — Discharge Instructions (Signed)
No driving for 4 days. No lifting over 5 lbs for 1 week. No vigorous or sexual activity for 1 week. You may return to work on 11/07/16. Keep procedure site clean & dry. If you notice increased pain, swelling, bleeding or pus, call/return!  You may shower, but no soaking baths/hot tubs/pools for 1 week.     You have an appointment set up with the [facility].  Multiple studies have shown that being followed by a dedicated atrial fibrillation clinic in addition to the standard care you receive from your other physicians improves health. We believe that enrollment in the atrial fibrillation clinic will allow [place] to better care for you.   The phone number to the [facility] is [phone]. The clinic is staffed Monday through Friday from 8:30am to 5pm.  Parking Directions: The clinic is located in the Heart and Vascular Building connected to [facility]. 1)From [address] turn on to [address] and go to the 3rd entrance  (Heart and Vascular entrance) on the right. 2)Look to the right for Heart &Vascular Parking Garage. 3) The code for the entrance is 8002 for December.   4)Take the elevators to the 1st floor. Registration is in the room with the glass walls at the end of the hallway.  If you have any trouble parking or locating the clinic, please dont hesitate to call [phone].

## 2017-03-03 NOTE — Progress Notes (Signed)
Per patient takes Benadryl 25mg  po at bedtime, medication is listed on patient's prior to admission medication list.  Cardiology paged, order received from [facility].

## 2017-03-03 NOTE — Interval H&P Note (Signed)
History and Physical Interval Note:  03/03/2017 7:11 AM  Steven Griffin  has presented today for surgery, with the diagnosis of afib  The various methods of treatment have been discussed with the patient and family. After consideration of risks, benefits and other options for treatment, the patient has consented to  Procedure(s): Atrial Fibrillation Ablation (N/A) as a surgical intervention .  The patient's history has been reviewed, patient examined, no change in status, stable for surgery.  I have reviewed the patient's chart and labs.  Questions were answered to the patient's satisfaction.     [name]

## 2017-03-03 NOTE — Progress Notes (Signed)
Site area: rt groin fv sheaths x3 Site Prior to Removal:  Level 0 Pressure Applied For:  20 minutes Manual:   yes  Patient Status During Pull:  stable Post Pull Site:  Level  0 Post Pull Instructions Given:  yes Post Pull Pulses Present: palpable Dressing Applied:  Gauze and tegaderm Bedrest begins @ 1130 Comments: IV SALINE LOCKED

## 2017-03-03 NOTE — Anesthesia Postprocedure Evaluation (Signed)
Anesthesia Post Note  Patient: Steven Griffin  Procedure(s) Performed: Atrial Fibrillation Ablation (N/A )     Patient location during evaluation: Cath Lab Anesthesia Type: General Level of consciousness: awake and alert, oriented and awake Pain management: pain level controlled Vital Signs Assessment: post-procedure vital signs reviewed and stable Respiratory status: spontaneous breathing, nonlabored ventilation and respiratory function stable Cardiovascular status: blood pressure returned to baseline Postop Assessment: no headache Anesthetic complications: no    Last Vitals:  Vitals:   03/03/17 1804 03/03/17 1834  BP: 129/69 (!) 127/104  Pulse: 70 66  Resp: (!) 23 18  Temp:    SpO2: 94% 96%    Last Pain:  Vitals:   03/03/17 1059  TempSrc: Temporal                 Daizy Outen COKER

## 2017-03-03 NOTE — Progress Notes (Signed)
PHARMACIST - PHYSICIAN ORDER COMMUNICATION  CONCERNING: P&T Medication Policy on Herbal Medications  DESCRIPTION:  This patient's order for:  5-HTP 100mg  and digestive enzyme caps  has been noted.  This product(s) is classified as an "herbal" or natural product. Due to a lack of definitive safety studies or FDA approval, nonstandard manufacturing practices, plus the potential risk of unknown drug-drug interactions while on inpatient medications, the Pharmacy and Therapeutics Committee does not permit the use of "herbal" or natural products of this type within [facility].   ACTION TAKEN: The pharmacy department is unable to verify this order at this time and your patient has been informed of this safety policy. Please reevaluate patient's clinical condition at discharge and address if the herbal or natural product(s) should be resumed at that time.

## 2017-03-03 NOTE — Discharge Summary (Signed)
ELECTROPHYSIOLOGY PROCEDURE DISCHARGE SUMMARY    Patient ID: Steven Griffin,  MRN: [id number], DOB/AGE: [birth year] 70 y.o.  Admit date: 03/03/2017 Discharge date: 03/04/17  Primary Care Physician: Steele Sizer, MD  Primary Cardiologist: Dr. Fletcher Anon Electrophysiologist: [name], MD  Primary Discharge Diagnosis:  1. Persistent AFib     CHA2DS2Vasc is 1, on Xarelto peri-procedure  Secondary Discharge Diagnosis:  1. COPD  Procedures This Admission:  1.  Electrophysiology study and radiofrequency catheter ablation on 03/03/17 by Dr [name].   This study demonstrated    CONCLUSIONS: 1. Sinus rhythm upon presentation with salvos of atrial fibrillation and atrial flutter.   2. Intracardiac echo reveals a moderate sized left atrium with four separate pulmonary veins without evidence of pulmonary vein stenosis. 3. Successful electrical isolation and anatomical encircling of all four pulmonary veins with radiofrequency current.    4. Cavo-tricuspid isthmus ablation was performed with complete bidirectional isthmus block achieved.  5. No inducible arrhythmias following ablation both on and off of Isuprel 6. No early apparent complications.    Brief HPI: Steven Griffin is a 71 y.o. male with a history of persistent atrial fibrillation.  They have failed medical therapy with Tikosyn. Risks, benefits, and alternatives to catheter ablation of atrial fibrillation were reviewed with the patient who wished to proceed.  The patient underwent coronary CT prior to the procedure which demonstrated no LAA thrombus.    Hospital Course:  The patient was admitted and underwent EPS/RFCA of atrial fibrillation with details as outlined above.  The patient was monitored on telemetry overnight which demonstrated SR, PAC's occassionally.  Groin was without complication on the day of discharge.  The patient was examined by Dr. Rayann Heman and considered to be stable for discharge.   The patient has been  advised against benadryl given his Tikosyn, as well as cautioned regarding use of OTC supplements without guidance/review with his pharmacist.  Wound care and restrictions were reviewed with the patient.  The patient will be seen back by Roderic Palau, NP in 4 weeks and Dr Rayann Heman in 12 weeks for post ablation follow up.     Physical Exam: Vitals:   03/03/17 1834 03/03/17 2020 03/03/17 2023 03/04/17 0500  BP: (!) 127/104 133/71  119/73  Pulse: 66 60  66  Resp: 18  (!) 23 20  Temp:   98.7 F (37.1 C) 98.7 F (37.1 C)  TempSrc:   Oral Oral  SpO2: 96%  95% 96%  Weight:    177 lb 3.2 oz (80.4 kg)  Height:        GEN- The patient is well appearing, alert and oriented x 3 today.   HEENT: normocephalic, atraumatic; sclera clear, conjunctiva pink; hearing intact; oropharynx clear; neck supple  Lungs- CTA b/l, normal work of breathing.  No wheezes, rales, rhonchi Heart- RRR, no murmurs, rubs or gallops  GI- soft, non-tender, non-distended Extremities- no clubbing, cyanosis, or edema; DP/PT/radial pulses 2+ bilaterally, R groin without hematoma/bruit MS- no significant deformity or atrophy Skin- warm and dry, no rash or lesion Psych- euthymic mood, full affect Neuro- strength and sensation are intact   Labs:   Lab Results  Component Value Date   WBC 6.4 02/20/2017   HGB 15.3 02/20/2017   HCT 43.6 02/20/2017   MCV 91 02/20/2017   PLT 260 02/20/2017   No results for input(s): NA, K, CL, CO2, BUN, CREATININE, CALCIUM, PROT, BILITOT, ALKPHOS, ALT, AST, GLUCOSE in the last 168 hours.  Invalid input(s): LABALBU  Discharge Medications:  Allergies as of 03/04/2017   No Known Allergies     Medication List    STOP taking these medications   diphenhydrAMINE 25 mg capsule Commonly known as:  BENADRYL     TAKE these medications   5-HTP 100 MG Caps Take 100 mg by mouth at bedtime.   b complex vitamins capsule Take 1 capsule by mouth daily.   BETA-SITOSTEROL PLANT STEROLS  Caps Take 1 capsule by mouth daily.   GLUCOSAMINE CHOND MSM FORMULA PO Take 2 capsules by mouth 2 (two) times daily.   Co Q 10 100 MG Caps Take 100 mg by mouth daily.   DIGESTIVE ENZYME PO Take 1 capsule by mouth 3 (three) times daily.   diltiazem 30 MG tablet Commonly known as:  CARDIZEM Take 30 mg by mouth 4 (four) times daily as needed (for heart).   dofetilide 125 MCG capsule Commonly known as:  TIKOSYN TAKE 3 CAPSULES TWICE DAILY. What changed:    how much to take  how to take this  when to take this  additional instructions   fexofenadine 180 MG tablet Commonly known as:  ALLEGRA Take 180 mg by mouth daily.   finasteride 5 MG tablet Commonly known as:  PROSCAR Take 5 mg by mouth daily.   Fish Oil 1000 MG Caps Take 1,000 mg by mouth daily.   FLAXSEED OIL PO Take 1 each by mouth See admin instructions. Take 15 ml by mouth daily   L-Theanine 100 MG Caps Take 100 mg by mouth 2 (two) times daily.   LEG CRAMP RELIEF PO Take 2 tablets by mouth at bedtime.   lisinopril 5 MG tablet Commonly known as:  PRINIVIL,ZESTRIL TAKE (1) TABLET BY MOUTH EVERY DAY What changed:    how much to take  how to take this  when to take this  additional instructions   LUTEIN PO Take 2 tablets by mouth daily.   MAGNESIUM PO Take 2 capsules by mouth at bedtime.   Melatonin 3 MG Caps Take 3 mg by mouth at bedtime.   multivitamin tablet Take 1 tablet by mouth daily.   NON FORMULARY Take 1 tablet by mouth daily. AMPK Supplement   NON FORMULARY Take 1,000 mg by mouth 2 (two) times daily after a meal. CLA 1000 mg Supplement   OVER THE COUNTER MEDICATION Take 1 capsule by mouth daily. Bacopa Supplement   OVER THE COUNTER MEDICATION Take 2 tablets by mouth daily. Carb Shredder Supplement   pantoprazole 40 MG tablet Commonly known as:  PROTONIX Take 1 tablet (40 mg total) daily by mouth.   PROBIOTIC DAILY PO Take 1 capsule by mouth daily.   REFRESH  OP Place 2 drops into both eyes as needed (for dry eyes).   rivaroxaban 20 MG Tabs tablet Commonly known as:  XARELTO Take 1 tablet (20 mg total) by mouth daily with supper.   Selenium 100 MCG Caps Take 100 mcg by mouth daily.   sildenafil 20 MG tablet Commonly known as:  REVATIO Take 1-5 tablets (20-100 mg total) by mouth daily as needed. What changed:  reasons to take this   tamsulosin 0.4 MG Caps capsule Commonly known as:  FLOMAX TAKE (1) CAPSULE BY MOUTH EVERY DAY What changed:  See the new instructions.   TURMERIC CURCUMIN PO Take 900 mg by mouth daily.   VITAMIN D3 PO Take 1 capsule by mouth daily.   VITAMIN K2 PO Take 1 tablet by mouth daily.   zinc gluconate  50 MG tablet Take 50 mg by mouth daily.       Disposition:  Home Discharge Instructions    Diet - low sodium heart healthy   Complete by:  As directed    Increase activity slowly   Complete by:  As directed      Follow-up Information    [facility] Follow up on 04/03/2017.   Specialty:  Cardiology Why:  8:30AM Contact information: [address] [id number] [place] [phone]       [name], MD Follow up on 06/03/2017.   Specialty:  Cardiology Why:  9:45AM Contact information: [place] [address] [phone]           Duration of Discharge Encounter: Greater than 30 minutes including physician time.  Signed, Tommye Standard, PA-C 03/04/2017 9:57 AM  I have seen, examined the patient, and reviewed the above assessment and plan.  Changes to above are made where necessary.  On exam, RRR.  DC to home.  Resume current medicines.  Add PPI x 6 weeks.  Routine wound care and follow-up.  Co Sign: [name], MD 03/04/2017 10:55 AM

## 2017-03-04 ENCOUNTER — Encounter (HOSPITAL_COMMUNITY): Payer: Self-pay | Admitting: Internal Medicine

## 2017-03-04 DIAGNOSIS — E785 Hyperlipidemia, unspecified: Secondary | ICD-10-CM | POA: Diagnosis not present

## 2017-03-04 DIAGNOSIS — Z7982 Long term (current) use of aspirin: Secondary | ICD-10-CM | POA: Diagnosis not present

## 2017-03-04 DIAGNOSIS — I480 Paroxysmal atrial fibrillation: Secondary | ICD-10-CM

## 2017-03-04 DIAGNOSIS — I4892 Unspecified atrial flutter: Secondary | ICD-10-CM | POA: Diagnosis not present

## 2017-03-04 DIAGNOSIS — Z85828 Personal history of other malignant neoplasm of skin: Secondary | ICD-10-CM | POA: Diagnosis not present

## 2017-03-04 DIAGNOSIS — Z7901 Long term (current) use of anticoagulants: Secondary | ICD-10-CM | POA: Diagnosis not present

## 2017-03-04 DIAGNOSIS — I10 Essential (primary) hypertension: Secondary | ICD-10-CM | POA: Diagnosis not present

## 2017-03-04 DIAGNOSIS — R69 Illness, unspecified: Secondary | ICD-10-CM | POA: Diagnosis not present

## 2017-03-04 DIAGNOSIS — F17210 Nicotine dependence, cigarettes, uncomplicated: Secondary | ICD-10-CM | POA: Diagnosis not present

## 2017-03-04 DIAGNOSIS — J449 Chronic obstructive pulmonary disease, unspecified: Secondary | ICD-10-CM | POA: Diagnosis not present

## 2017-03-04 MED ORDER — PANTOPRAZOLE SODIUM 40 MG PO TBEC: 40.0000 mg | DELAYED_RELEASE_TABLET | Freq: Every day | ORAL | 0 refills | Status: DC

## 2017-03-11 ENCOUNTER — Encounter: Payer: Self-pay | Admitting: Family Medicine

## 2017-03-11 ENCOUNTER — Ambulatory Visit (INDEPENDENT_AMBULATORY_CARE_PROVIDER_SITE_OTHER): Payer: Medicare HMO | Admitting: Family Medicine

## 2017-03-11 VITALS — BP 106/64 | HR 87 | Temp 98.2°F | Resp 16 | Ht 71.0 in | Wt 174.6 lb

## 2017-03-11 DIAGNOSIS — N138 Other obstructive and reflux uropathy: Secondary | ICD-10-CM

## 2017-03-11 DIAGNOSIS — Z9889 Other specified postprocedural states: Secondary | ICD-10-CM

## 2017-03-11 DIAGNOSIS — N401 Enlarged prostate with lower urinary tract symptoms: Secondary | ICD-10-CM

## 2017-03-11 DIAGNOSIS — R05 Cough: Secondary | ICD-10-CM | POA: Diagnosis not present

## 2017-03-11 DIAGNOSIS — I480 Paroxysmal atrial fibrillation: Secondary | ICD-10-CM

## 2017-03-11 DIAGNOSIS — J069 Acute upper respiratory infection, unspecified: Secondary | ICD-10-CM | POA: Diagnosis not present

## 2017-03-11 DIAGNOSIS — R059 Cough, unspecified: Secondary | ICD-10-CM

## 2017-03-11 MED ORDER — HYDROCOD POLST-CPM POLST ER 10-8 MG/5ML PO SUER: 5.0000 mL | Freq: Two times a day (BID) | ORAL | 0 refills | Status: DC | PRN

## 2017-03-11 NOTE — Progress Notes (Signed)
Name: Steven Griffin   MRN: [id number]    DOB: 08-22-1945   Date:03/11/2017       Progress Note  Subjective  Chief Complaint  Chief Complaint  Patient presents with  . Cough    Onset-13 days ago, productive cough clear mucus. Patient has tried Mucinex DM, Vicks, extra Zinc, humidifers with no relief. Patient states he had some cough medication left over from last year that helped him     HPI  Cough: patient states he developed cold symptoms with some nasal congestion, right ear fullness and clear rhinorrhea over one week ago. It was followed by a productive cough , sputum is clear and symptoms seems to be improving, except for the cough. He denies wheezing or SOB, no fever, chills or change in appetite. He states his chest feels like burns sometimes. He is taking otc medication but would like something that could help with his cough specially at night.   Afib with recent AV node ablation: last week had the procedure, feeling well except for cough that started prior to procedure.   Abnormal PSA and hematospermia: it started earlier this year, seen by urologist PSA was high but normalized since, feeling , denies recurrent episodes of hematospermia  Patient Active Problem List   Diagnosis Date Noted  . Paroxysmal atrial fibrillation ([place]) 03/03/2017  . Bradycardia 08/31/2015  . Muscle tear 04/20/2015  . Allergic rhinitis, seasonal 04/20/2015  . Chronic venous insufficiency 10/27/2014  . Intermittent tremor 10/27/2014  . ED (erectile dysfunction) of non-organic origin 10/27/2014  . Colon, diverticulosis 10/27/2014  . Benign prostatic hypertrophy without urinary obstruction 10/27/2014  . Systolic CHF, chronic ([place]) 08/08/2013  . Bundle branch block, right and left anterior fascicular 06/07/2013  . Atrial fibrillation ([place]) 10/20/2012  . Obstructive apnea 03/23/2012  . Polypharmacy 04/15/2011  . Hypercholesteremia 01/18/2011  . H/O squamous cell carcinoma of skin 01/18/2011  . History  of major vascular surgery 01/18/2011    Past Surgical History:  Procedure Laterality Date  . rib cartilage     benign tumor  . SKIN CANCER EXCISION Right 06/2010   lower leg  . TONSILLECTOMY AND ADENOIDECTOMY    . VEIN LIGATION AND STRIPPING      Family History  Problem Relation Age of Onset  . Suicidality Mother   . Leukemia Father   . Hypercholesterolemia Father   . Colon cancer Maternal Grandmother   . Hypertension Brother   . Hyperlipidemia Brother   . Cirrhosis Brother   . Alcoholism Brother     Social History   Socioeconomic History  . Marital status: Married    Spouse name: Neoma Laming  . Number of children: 2  . Years of education: 82  . Highest education level: Not on file  Social Needs  . Financial resource strain: Not very hard  . Food insecurity - worry: Never true  . Food insecurity - inability: Never true  . Transportation needs - medical: No  . Transportation needs - non-medical: No  Occupational History  . Occupation: Product manager  Tobacco Use  . Smoking status: Current Every Day Smoker    Packs/day: 1.00    Years: 30.00    Pack years: 30.00    Types: Cigarettes, Cigars  . Smokeless tobacco: Never Used  Substance and Sexual Activity  . Alcohol use: Yes    Alcohol/week: 0.6 oz    Types: 1 Glasses of wine per week    Comment: 2-3 glasses of wine at night  . Drug  use: No  . Sexual activity: Yes  Other Topics Concern  . Not on file  Social History Narrative   Pt lives in [place] with spouse.  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     Current Outpatient Medications:  .  5-Hydroxytryptophan (5-HTP) 100 MG CAPS, Take 100 mg by mouth at bedtime. , Disp: , Rfl:  .  b complex vitamins capsule, Take 1 capsule by mouth daily., Disp: , Rfl:  .  Cholecalciferol (VITAMIN D3 PO), Take 1 capsule by mouth daily., Disp: , Rfl:  .  Coenzyme Q10 (CO Q 10) 100 MG CAPS, Take 100 mg by mouth daily., Disp: , Rfl:  .  Digestive  Enzymes (DIGESTIVE ENZYME PO), Take 1 capsule by mouth 3 (three) times daily. , Disp: , Rfl:  .  diltiazem (CARDIZEM) 30 MG tablet, Take 30 mg by mouth 4 (four) times daily as needed (for heart). , Disp: , Rfl:  .  dofetilide (TIKOSYN) 125 MCG capsule, TAKE 3 CAPSULES TWICE DAILY. (Patient taking differently: Take 375 mcg by mouth 2 (two) times daily. ), Disp: 180 capsule, Rfl: 3 .  fexofenadine (ALLEGRA) 180 MG tablet, Take 180 mg by mouth daily. , Disp: , Rfl:  .  finasteride (PROSCAR) 5 MG tablet, Take 5 mg by mouth daily., Disp: , Rfl:  .  Flaxseed, Linseed, (FLAXSEED OIL PO), Take 1 each by mouth See admin instructions. Take 15 ml by mouth daily, Disp: , Rfl:  .  Homeopathic Products (LEG CRAMP RELIEF PO), Take 2 tablets by mouth at bedtime., Disp: , Rfl:  .  L-Theanine 100 MG CAPS, Take 100 mg by mouth 2 (two) times daily., Disp: , Rfl:  .  lisinopril (PRINIVIL,ZESTRIL) 5 MG tablet, TAKE (1) TABLET BY MOUTH EVERY DAY (Patient taking differently: Take 5 mg by mouth daily. ), Disp: 90 tablet, Rfl: 2 .  LUTEIN PO, Take 2 tablets by mouth daily., Disp: , Rfl:  .  MAGNESIUM PO, Take 2 capsules by mouth at bedtime. , Disp: , Rfl:  .  Melatonin 3 MG CAPS, Take 3 mg by mouth at bedtime. , Disp: , Rfl:  .  Menaquinone-7 (VITAMIN K2 PO), Take 1 tablet by mouth daily., Disp: , Rfl:  .  Misc Natural Products (BETA-SITOSTEROL PLANT STEROLS) CAPS, Take 1 capsule by mouth daily., Disp: , Rfl:  .  Misc Natural Products (GLUCOSAMINE CHOND MSM FORMULA PO), Take 2 capsules by mouth 2 (two) times daily., Disp: , Rfl:  .  Multiple Vitamin (MULTIVITAMIN) tablet, Take 1 tablet by mouth daily., Disp: , Rfl:  .  NON FORMULARY, Take 1 tablet by mouth daily. AMPK Supplement, Disp: , Rfl:  .  NON FORMULARY, Take 1,000 mg by mouth 2 (two) times daily after a meal. CLA 1000 mg Supplement, Disp: , Rfl:  .  Omega-3 Fatty Acids (FISH OIL) 1000 MG CAPS, Take 1,000 mg by mouth daily. , Disp: , Rfl:  .  OVER THE COUNTER  MEDICATION, Take 1 capsule by mouth daily. Bacopa Supplement, Disp: , Rfl:  .  OVER THE COUNTER MEDICATION, Take 2 tablets by mouth daily. Carb Shredder Supplement, Disp: , Rfl:  .  pantoprazole (PROTONIX) 40 MG tablet, Take 1 tablet (40 mg total) daily by mouth., Disp: 45 tablet, Rfl: 0 .  Polyvinyl Alcohol-Povidone (REFRESH OP), Place 2 drops into both eyes as needed (for dry eyes)., Disp: , Rfl:  .  Probiotic Product (PROBIOTIC DAILY PO), Take 1 capsule by mouth daily. , Disp: ,  Rfl:  .  rivaroxaban (XARELTO) 20 MG TABS tablet, Take 1 tablet (20 mg total) by mouth daily with supper., Disp: 30 tablet, Rfl: 6 .  Selenium 100 MCG CAPS, Take 100 mcg by mouth daily., Disp: , Rfl:  .  sildenafil (REVATIO) 20 MG tablet, Take 1-5 tablets (20-100 mg total) by mouth daily as needed. (Patient taking differently: Take 20-100 mg by mouth daily as needed (for ED). ), Disp: 50 tablet, Rfl: 2 .  tamsulosin (FLOMAX) 0.4 MG CAPS capsule, TAKE (1) CAPSULE BY MOUTH EVERY DAY (Patient taking differently: TAKE 0.4 MG BY MOUTH EVERY DAY), Disp: 90 capsule, Rfl: 1 .  TURMERIC CURCUMIN PO, Take 900 mg by mouth daily., Disp: , Rfl:  .  zinc gluconate 50 MG tablet, Take 50 mg by mouth daily., Disp: , Rfl:   No Known Allergies   ROS  Ten systems reviewed and is negative except as mentioned in HPI   Objective  Vitals:   03/11/17 1155  BP: 106/64  Pulse: 87  Resp: 16  Temp: 98.2 F (36.8 C)  TempSrc: Oral  SpO2: 96%  Weight: 174 lb 9.6 oz (79.2 kg)  Height: 5\' 11"  (1.803 m)    Body mass index is 24.35 kg/m.  Physical Exam  Constitutional: Patient appears well-developed and well-nourished. No distress.  HEENT: head atraumatic, normocephalic, pupils equal and reactive to light, ears normal TM bilaterally, neck supple, throat within normal limits Cardiovascular: Normal rate, regular rhythm and normal heart sounds.  No murmur heard. No BLE edema. Pulmonary/Chest: Effort normal and breath sounds normal. No  respiratory distress. Abdominal: Soft.  There is no tenderness. Psychiatric: Patient has a normal mood and affect. behavior is normal. Judgment and thought content normal.  Recent Results (from the past 2160 hour(s))  Basic metabolic panel     Status: None   Collection Time: 12/18/16  9:09 AM  Result Value Ref Range   Glucose 85 65 - 99 mg/dL   BUN 21 8 - 27 mg/dL   Creatinine, Ser 0.90 0.76 - 1.27 mg/dL   GFR calc non Af Amer 86 >59 mL/min/1.73   GFR calc Af Amer 100 >59 mL/min/1.73   BUN/Creatinine Ratio 23 10 - 24   Sodium 138 134 - 144 mmol/L   Potassium 5.0 3.5 - 5.2 mmol/L   Chloride 102 96 - 106 mmol/L   CO2 24 20 - 29 mmol/L   Calcium 9.0 8.6 - 10.2 mg/dL  Magnesium     Status: None   Collection Time: 12/18/16  9:09 AM  Result Value Ref Range   Magnesium 2.0 1.6 - 2.3 mg/dL  VITAMIN D 25 Hydroxy (Vit-D Deficiency, Fractures)     Status: None   Collection Time: 12/26/16 12:00 AM  Result Value Ref Range   Vit D, 25-Hydroxy 32.9   Basic metabolic panel     Status: None   Collection Time: 12/26/16 12:00 AM  Result Value Ref Range   Glucose 93    BUN 19 4 - 21   Creatinine 0.9 0.6 - 1.3   Potassium 5.0 3.4 - 5.3   Sodium 143 137 - 147  Lipid panel     Status: None   Collection Time: 12/26/16 12:00 AM  Result Value Ref Range   Triglycerides 113 40 - 160   Cholesterol 151 0 - 200   HDL 52 35 - 70   LDL Cholesterol 76   Hepatic function panel     Status: None   Collection Time: 12/26/16 12:00  AM  Result Value Ref Range   Alkaline Phosphatase 65 25 - 125   ALT 28 10 - 40   AST 25 14 - 40  Hemoglobin A1c     Status: None   Collection Time: 12/26/16 12:00 AM  Result Value Ref Range   Hemoglobin A1C 5.4   TSH     Status: None   Collection Time: 12/26/16 12:00 AM  Result Value Ref Range   TSH 1.61 0.41 - 5.90  PSA     Status: None   Collection Time: 01/27/17 12:00 AM  Result Value Ref Range   PSA 3.9   Basic metabolic panel     Status: Abnormal   Collection  Time: 02/20/17 11:22 AM  Result Value Ref Range   Glucose 92 65 - 99 mg/dL   BUN 25 8 - 27 mg/dL   Creatinine, Ser 0.81 0.76 - 1.27 mg/dL   GFR calc non Af Amer 90 >59 mL/min/1.73   GFR calc Af Amer 104 >59 mL/min/1.73   BUN/Creatinine Ratio 31 (H) 10 - 24   Sodium 142 134 - 144 mmol/L   Potassium 4.4 3.5 - 5.2 mmol/L   Chloride 103 96 - 106 mmol/L   CO2 25 20 - 29 mmol/L   Calcium 9.8 8.6 - 10.2 mg/dL  CBC with Differential/Platelet     Status: None   Collection Time: 02/20/17 11:22 AM  Result Value Ref Range   WBC 6.4 3.4 - 10.8 x10E3/uL   RBC 4.82 4.14 - 5.80 x10E6/uL   Hemoglobin 15.3 13.0 - 17.7 g/dL   Hematocrit 43.6 37.5 - 51.0 %   MCV 91 79 - 97 fL   MCH 31.7 26.6 - 33.0 pg   MCHC 35.1 31.5 - 35.7 g/dL   RDW 13.4 12.3 - 15.4 %   Platelets 260 150 - 379 x10E3/uL   Neutrophils 73 Not Estab. %   Lymphs 15 Not Estab. %   Monocytes 9 Not Estab. %   Eos 3 Not Estab. %   Basos 0 Not Estab. %   Neutrophils Absolute 4.7 1.4 - 7.0 x10E3/uL   Lymphocytes Absolute 0.9 0.7 - 3.1 x10E3/uL   Monocytes Absolute 0.6 0.1 - 0.9 x10E3/uL   EOS (ABSOLUTE) 0.2 0.0 - 0.4 x10E3/uL   Basophils Absolute 0.0 0.0 - 0.2 x10E3/uL   Immature Granulocytes 0 Not Estab. %   Immature Grans (Abs) 0.0 0.0 - 0.1 x10E3/uL  POCT Activated clotting time     Status: None   Collection Time: 03/03/17 10:04 AM  Result Value Ref Range   Activated Clotting Time 290 seconds  POCT Activated clotting time     Status: None   Collection Time: 03/03/17 10:25 AM  Result Value Ref Range   Activated Clotting Time 296 seconds  POCT Activated clotting time     Status: None   Collection Time: 03/03/17 10:56 AM  Result Value Ref Range   Activated Clotting Time 334 seconds  POCT Activated clotting time     Status: None   Collection Time: 03/03/17 11:07 AM  Result Value Ref Range   Activated Clotting Time 186 seconds      PHQ2/9: Depression screen [facility] 2/9 09/02/2016 08/05/2016 01/18/2016 08/31/2015 04/20/2015   Decreased Interest 0 0 0 0 0  Down, Depressed, Hopeless 0 0 0 0 0  PHQ - 2 Score 0 0 0 0 0    Fall Risk: Fall Risk  09/02/2016 09/02/2016 08/05/2016 01/18/2016 08/31/2015  Falls in the past year? Yes No No  No No  Comment tripped on a suit case - - - -  Injury with Fall? Yes - - - -     Assessment & Plan   1. Cough  - chlorpheniramine-HYDROcodone (TUSSIONEX PENNKINETIC ER) 10-8 MG/5ML SUER; Take 5 mLs every 12 (twelve) hours as needed by mouth.  Dispense: 140 mL; Refill: 0  2. Paroxysmal atrial fibrillation (HCC)   3. History of atrioventricular nodal ablation  Doing well   4. Benign prostatic hyperplasia with urinary obstruction  Doing well, PSA back to normal   5. Viral upper respiratory tract infection  Discussed how to use saline and nasal steroid to help with eustachian tube dysfunction right ear

## 2017-03-18 ENCOUNTER — Telehealth: Payer: Self-pay | Admitting: Cardiovascular Disease

## 2017-03-18 NOTE — Telephone Encounter (Signed)
No answer. Left message to call back.   

## 2017-03-18 NOTE — Telephone Encounter (Signed)
Patient says he needs our office to send an appeal for name brand Tikosyn to be covered due to not tolerating the generic in the past    please call

## 2017-03-27 ENCOUNTER — Other Ambulatory Visit: Payer: Self-pay

## 2017-03-27 MED ORDER — DOFETILIDE 125 MCG PO CAPS: 375.0000 ug | ORAL_CAPSULE | Freq: Two times a day (BID) | ORAL | 5 refills | Status: DC

## 2017-03-27 NOTE — Telephone Encounter (Signed)
Per pharmacy tech at [facility], [employer] covers [place].  Refills submitted.

## 2017-03-31 ENCOUNTER — Other Ambulatory Visit: Payer: Self-pay | Admitting: Physician Assistant

## 2017-03-31 NOTE — Telephone Encounter (Signed)
Please review for refill.  The original Rx was sent to [facility] to be refilled by Dr. Rayann Heman but [facility] office sent the refill request back to [facility].  Does Dr. Fletcher Anon want to continue the patient on the Protonix 40 mg if so, please refill?  The last discharge summary stated by Dr. Rayann Heman for the patient to continue the Protonix.

## 2017-03-31 NOTE — Telephone Encounter (Signed)
Please advise if ok to refill Rx °

## 2017-03-31 NOTE — Telephone Encounter (Signed)
This a [place] [facility] pt 

## 2017-03-31 NOTE — Telephone Encounter (Signed)
Pt's pharmacy is requesting a refill on pantoprazole 40 mg tablet. This is a [facility] pt and Rx was sent to [facility], but they sent it back stating that Dr. Rayann Heman need to refill this. Pt was put on this medication in the hospital. Please address

## 2017-04-01 NOTE — Telephone Encounter (Signed)
Ok to fill 

## 2017-04-03 ENCOUNTER — Encounter (HOSPITAL_COMMUNITY): Payer: Self-pay | Admitting: Nurse Practitioner

## 2017-04-03 ENCOUNTER — Ambulatory Visit (HOSPITAL_COMMUNITY)
Admit: 2017-04-03 | Discharge: 2017-04-03 | Disposition: A | Discharge status: Home / Self Care | Payer: Medicare HMO | Attending: Nurse Practitioner | Admitting: Nurse Practitioner

## 2017-04-03 VITALS — BP 132/78 | HR 65 | Ht 71.0 in | Wt 176.8 lb

## 2017-04-03 DIAGNOSIS — Z7901 Long term (current) use of anticoagulants: Secondary | ICD-10-CM | POA: Insufficient documentation

## 2017-04-03 DIAGNOSIS — I4891 Unspecified atrial fibrillation: Secondary | ICD-10-CM | POA: Diagnosis not present

## 2017-04-03 DIAGNOSIS — E785 Hyperlipidemia, unspecified: Secondary | ICD-10-CM | POA: Diagnosis not present

## 2017-04-03 DIAGNOSIS — I110 Hypertensive heart disease with heart failure: Secondary | ICD-10-CM | POA: Insufficient documentation

## 2017-04-03 DIAGNOSIS — F17210 Nicotine dependence, cigarettes, uncomplicated: Secondary | ICD-10-CM | POA: Diagnosis not present

## 2017-04-03 DIAGNOSIS — Z9889 Other specified postprocedural states: Secondary | ICD-10-CM | POA: Insufficient documentation

## 2017-04-03 DIAGNOSIS — R69 Illness, unspecified: Secondary | ICD-10-CM | POA: Diagnosis not present

## 2017-04-03 DIAGNOSIS — I498 Other specified cardiac arrhythmias: Secondary | ICD-10-CM | POA: Insufficient documentation

## 2017-04-03 DIAGNOSIS — I481 Persistent atrial fibrillation: Secondary | ICD-10-CM | POA: Diagnosis not present

## 2017-04-03 DIAGNOSIS — J449 Chronic obstructive pulmonary disease, unspecified: Secondary | ICD-10-CM | POA: Insufficient documentation

## 2017-04-03 DIAGNOSIS — Z85828 Personal history of other malignant neoplasm of skin: Secondary | ICD-10-CM | POA: Insufficient documentation

## 2017-04-03 DIAGNOSIS — I4510 Unspecified right bundle-branch block: Secondary | ICD-10-CM | POA: Diagnosis not present

## 2017-04-03 DIAGNOSIS — K5730 Diverticulosis of large intestine without perforation or abscess without bleeding: Secondary | ICD-10-CM | POA: Insufficient documentation

## 2017-04-03 DIAGNOSIS — I509 Heart failure, unspecified: Secondary | ICD-10-CM | POA: Insufficient documentation

## 2017-04-03 DIAGNOSIS — I4819 Other persistent atrial fibrillation: Secondary | ICD-10-CM

## 2017-04-03 NOTE — Progress Notes (Signed)
Primary Care Physician: Steele Sizer, MD Referring Physician: Dr. Mariane Duval Steven Griffin is a 71 y.o. male with a h/o afib on Tikosyn, that is here for f/u recent ablation 11/6. He reports no afib that he is aware of after ablation. He continues on [place]. No issues with groin or swallowing. Continues on xarelto.  Today, he denies symptoms of palpitations, chest pain, shortness of breath, orthopnea, PND, lower extremity edema, dizziness, presyncope, syncope, or neurologic sequela. The patient is tolerating medications without difficulties and is otherwise without complaint today.   Past Medical History:  Diagnosis Date  . Allergic rhinitis, cause unspecified   . Atrial fibrillation ([place])   . CHF (congestive heart failure) ([place])   . COPD (chronic obstructive pulmonary disease) ([place])    pt denies  . Diverticulosis of colon (without mention of hemorrhage)   . Essential hypertension, benign    pt denies  . Hyperlipidemia   . Impotence of organic origin   . Mild anxiety   . Skin cancer   . Unspecified venous (peripheral) insufficiency    Past Surgical History:  Procedure Laterality Date  . ATRIAL FIBRILLATION ABLATION N/A 03/03/2017   Procedure: Atrial Fibrillation Ablation;  Surgeon: [name], MD;  Location: [facility];  Service: Cardiovascular;  Laterality: N/A;  . COLONOSCOPY WITH PROPOFOL N/A 11/20/2014   Procedure: COLONOSCOPY WITH PROPOFOL;  Surgeon: Manya Silvas, MD;  Location: [facility] ENDOSCOPY;  Service: Endoscopy;  Laterality: N/A;  . rib cartilage     benign tumor  . SKIN CANCER EXCISION Right 06/2010   lower leg  . TONSILLECTOMY AND ADENOIDECTOMY    . VEIN LIGATION AND STRIPPING      Current Outpatient Medications  Medication Sig Dispense Refill  . 5-Hydroxytryptophan (5-HTP) 100 MG CAPS Take 100 mg by mouth at bedtime.     Marland Kitchen b complex vitamins capsule Take 1 capsule by mouth daily.    . chlorpheniramine-HYDROcodone (TUSSIONEX PENNKINETIC ER) 10-8  MG/5ML SUER Take 5 mLs every 12 (twelve) hours as needed by mouth. 140 mL 0  . Cholecalciferol (VITAMIN D3 PO) Take 1 capsule by mouth daily.    . Coenzyme Q10 (CO Q 10) 100 MG CAPS Take 100 mg by mouth daily.    . Digestive Enzymes (DIGESTIVE ENZYME PO) Take 1 capsule by mouth 3 (three) times daily.     Marland Kitchen diltiazem (CARDIZEM) 30 MG tablet Take 30 mg by mouth 4 (four) times daily as needed (for heart).     . dofetilide (TIKOSYN) 125 MCG capsule Take 3 capsules (375 mcg total) by mouth 2 (two) times daily. 180 capsule 5  . fexofenadine (ALLEGRA) 180 MG tablet Take 180 mg by mouth daily.     . finasteride (PROSCAR) 5 MG tablet Take 5 mg by mouth daily.    . Flaxseed, Linseed, (FLAXSEED OIL PO) Take 1 each by mouth See admin instructions. Take 15 ml by mouth daily    . Homeopathic Products (LEG CRAMP RELIEF PO) Take 2 tablets by mouth at bedtime.    Marland Kitchen L-Theanine 100 MG CAPS Take 100 mg by mouth 2 (two) times daily.    Marland Kitchen lisinopril (PRINIVIL,ZESTRIL) 5 MG tablet TAKE (1) TABLET BY MOUTH EVERY DAY (Patient taking differently: Take 5 mg by mouth daily. ) 90 tablet 2  . LUTEIN PO Take 2 tablets by mouth daily.    Marland Kitchen MAGNESIUM PO Take 2 capsules by mouth at bedtime.     . Melatonin 3 MG CAPS Take 3  mg by mouth at bedtime.     . Menaquinone-7 (VITAMIN K2 PO) Take 1 tablet by mouth daily.    . Misc Natural Products (BETA-SITOSTEROL PLANT STEROLS) CAPS Take 1 capsule by mouth daily.    . Misc Natural Products (GLUCOSAMINE CHOND MSM FORMULA PO) Take 2 capsules by mouth 2 (two) times daily.    . Multiple Vitamin (MULTIVITAMIN) tablet Take 1 tablet by mouth daily.    . NON FORMULARY Take 1 tablet by mouth daily. AMPK Supplement    . NON FORMULARY Take 1,000 mg by mouth 2 (two) times daily after a meal. CLA 1000 mg Supplement    . Omega-3 Fatty Acids (FISH OIL) 1000 MG CAPS Take 1,000 mg by mouth daily.     Marland Kitchen OVER THE COUNTER MEDICATION Take 1 capsule by mouth daily. Bacopa Supplement    . OVER THE COUNTER  MEDICATION Take 2 tablets by mouth daily. Carb Shredder Supplement    . pantoprazole (PROTONIX) 40 MG tablet TAKE (1) TABLET BY MOUTH EVERY DAY 45 tablet 3  . Polyvinyl Alcohol-Povidone (REFRESH OP) Place 2 drops into both eyes as needed (for dry eyes).    . Probiotic Product (PROBIOTIC DAILY PO) Take 1 capsule by mouth daily.     . rivaroxaban (XARELTO) 20 MG TABS tablet Take 1 tablet (20 mg total) by mouth daily with supper. 30 tablet 6  . Selenium 100 MCG CAPS Take 100 mcg by mouth daily.    . sildenafil (REVATIO) 20 MG tablet Take 1-5 tablets (20-100 mg total) by mouth daily as needed. (Patient taking differently: Take 20-100 mg by mouth daily as needed (for ED). ) 50 tablet 2  . tamsulosin (FLOMAX) 0.4 MG CAPS capsule TAKE (1) CAPSULE BY MOUTH EVERY DAY (Patient taking differently: TAKE 0.4 MG BY MOUTH EVERY DAY) 90 capsule 1  . TURMERIC CURCUMIN PO Take 900 mg by mouth daily.    Marland Kitchen zinc gluconate 50 MG tablet Take 50 mg by mouth daily.     No current facility-administered medications for this encounter.     No Known Allergies  Social History   Socioeconomic History  . Marital status: Married    Spouse name: Neoma Laming  . Number of children: 2  . Years of education: 29  . Highest education level: Not on file  Social Needs  . Financial resource strain: Not very hard  . Food insecurity - worry: Never true  . Food insecurity - inability: Never true  . Transportation needs - medical: No  . Transportation needs - non-medical: No  Occupational History  . Occupation: Product manager  Tobacco Use  . Smoking status: Current Every Day Smoker    Packs/day: 1.00    Years: 30.00    Pack years: 30.00    Types: Cigarettes, Cigars  . Smokeless tobacco: Never Used  Substance and Sexual Activity  . Alcohol use: Yes    Alcohol/week: 0.6 oz    Types: 1 Glasses of wine per week    Comment: 2-3 glasses of wine at night  . Drug use: No  . Sexual activity: Yes  Other Topics Concern  .  Not on file  Social History Narrative   Pt lives in [place] with spouse.  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    Family History  Problem Relation Age of Onset  . Suicidality Mother   . Leukemia Father   . Hypercholesterolemia Father   . Colon cancer Maternal Grandmother   . Hypertension  Brother   . Hyperlipidemia Brother   . Cirrhosis Brother   . Alcoholism Brother     ROS- All systems are reviewed and negative except as per the HPI above  Physical Exam: Vitals:   04/03/17 0845  BP: 132/78  Pulse: 65  Weight: 176 lb 12.8 oz (80.2 kg)  Height: 5\' 11"  (1.803 m)   Wt Readings from Last 3 Encounters:  04/03/17 176 lb 12.8 oz (80.2 kg)  03/11/17 174 lb 9.6 oz (79.2 kg)  03/04/17 177 lb 3.2 oz (80.4 kg)    Labs: Lab Results  Component Value Date   NA 142 02/20/2017   K 4.4 02/20/2017   CL 103 02/20/2017   CO2 25 02/20/2017   GLUCOSE 92 02/20/2017   BUN 25 02/20/2017   CREATININE 0.81 02/20/2017   CALCIUM 9.8 02/20/2017   MG 2.0 12/18/2016   No results found for: INR Lab Results  Component Value Date   CHOL 151 12/26/2016   HDL 52 12/26/2016   LDLCALC 76 12/26/2016   TRIG 113 12/26/2016     GEN- The patient is well appearing, alert and oriented x 3 today.   Head- normocephalic, atraumatic Eyes-  Sclera clear, conjunctiva pink Ears- hearing intact Oropharynx- clear Neck- supple, no JVP Lymph- no cervical lymphadenopathy Lungs- Clear to ausculation bilaterally, normal work of breathing Heart- Regular rate and rhythm, no murmurs, rubs or gallops, PMI not laterally displaced GI- soft, NT, ND, + BS Extremities- no clubbing, cyanosis, or edema MS- no significant deformity or atrophy Skin- no rash or lesion Psych- euthymic mood, full affect Neuro- strength and sensation are intact  EKG- NSR ar 65 bpm, PR int 138 ms, qrs int 132, qtc 482 ms  Bifascicular block  Epic records reviewed   Assessment and Plan: 1. afib S/p ablation  and maintaining SR Continue dofetilide 125 mcg bid  Continue xarelto 20 mg qd for chadsvasc score of at least 2 Reminded not to miss any doses of blood thinnser for the full 3 months following procedure  F/u with Dr. Rayann Heman as scheduled 06/03/17  Geroge Baseman. [place], [facility] [address] [place], [place] [address] [phone]

## 2017-04-16 ENCOUNTER — Other Ambulatory Visit: Payer: Self-pay | Admitting: Family Medicine

## 2017-04-16 DIAGNOSIS — M9902 Segmental and somatic dysfunction of thoracic region: Secondary | ICD-10-CM | POA: Diagnosis not present

## 2017-04-16 DIAGNOSIS — M9903 Segmental and somatic dysfunction of lumbar region: Secondary | ICD-10-CM | POA: Diagnosis not present

## 2017-04-16 DIAGNOSIS — M531 Cervicobrachial syndrome: Secondary | ICD-10-CM | POA: Diagnosis not present

## 2017-04-16 DIAGNOSIS — M546 Pain in thoracic spine: Secondary | ICD-10-CM | POA: Diagnosis not present

## 2017-04-16 DIAGNOSIS — M5136 Other intervertebral disc degeneration, lumbar region: Secondary | ICD-10-CM | POA: Diagnosis not present

## 2017-04-16 DIAGNOSIS — M9901 Segmental and somatic dysfunction of cervical region: Secondary | ICD-10-CM | POA: Diagnosis not present

## 2017-04-16 NOTE — Telephone Encounter (Signed)
Dr. Fletcher Anon,  Can you do a letter for this patient or would you prefer Dr. Rayann Heman to do it? Patient is s/p a-fib ablation, but continues on tikosyn.  Per Dr. Jackalyn Lombard 11/2016 note: History of Present Illness: Steven Griffin is a 71 y.o. male who presents today for electrophysiology evaluation.   The patient has symptomatic persistent afib.  He has seen me previously 10/17 and elected to continue medical management.  He recently took generic tikosyn and noticed increasing afib.  He is now back on branded tikosyn and feels somewhat better.  He has had several transient presyncope spells over the past few months of unclear etiology.  He thinks that this may be due to [place] and wishes to consider ablation in hopes to stop tikosyn.

## 2017-04-16 NOTE — Telephone Encounter (Signed)
Refill request for Hypertension medication:  Lisinopril 5 mg  Last office visit pertaining to hypertension: 03/11/2017  Follow up visit: 09/07/2017  BP Readings from Last 3 Encounters:  04/03/17 132/78  03/11/17 106/64  03/04/17 121/71     Lab Results  Component Value Date   CREATININE 0.81 02/20/2017   BUN 25 02/20/2017   NA 142 02/20/2017   K 4.4 02/20/2017   CL 103 02/20/2017   CO2 25 02/20/2017

## 2017-04-16 NOTE — Telephone Encounter (Signed)
Dr. Rayann Heman,  Can you please review the message below regarding the patient's tikoysn.  Thanks!

## 2017-04-16 NOTE — Telephone Encounter (Signed)
Please check with Dr. Rayann Heman because I am not certain how long he wants to keep him on Tikosyn.

## 2017-04-16 NOTE — Telephone Encounter (Signed)
Pt calling  States he came in a while ago and wanted [place] to please send a letter or something to [place] for insurance won't cover Tikosyn for 2019 He needs it to state that he needs to stay on the brand name for he can't tolerate the Generic   We can send it through MyChart to patient  Please advise

## 2017-04-17 ENCOUNTER — Encounter (INDEPENDENT_AMBULATORY_CARE_PROVIDER_SITE_OTHER): Payer: Self-pay

## 2017-04-17 DIAGNOSIS — M9902 Segmental and somatic dysfunction of thoracic region: Secondary | ICD-10-CM | POA: Diagnosis not present

## 2017-04-17 DIAGNOSIS — M546 Pain in thoracic spine: Secondary | ICD-10-CM | POA: Diagnosis not present

## 2017-04-17 DIAGNOSIS — M531 Cervicobrachial syndrome: Secondary | ICD-10-CM | POA: Diagnosis not present

## 2017-04-17 DIAGNOSIS — M9903 Segmental and somatic dysfunction of lumbar region: Secondary | ICD-10-CM | POA: Diagnosis not present

## 2017-04-17 DIAGNOSIS — M5136 Other intervertebral disc degeneration, lumbar region: Secondary | ICD-10-CM | POA: Diagnosis not present

## 2017-04-17 DIAGNOSIS — M9901 Segmental and somatic dysfunction of cervical region: Secondary | ICD-10-CM | POA: Diagnosis not present

## 2017-04-17 NOTE — Telephone Encounter (Signed)
Request for non-formulary coverage of branded tikosyn sent to [facility]. Will await response.

## 2017-04-17 NOTE — Telephone Encounter (Signed)
Though I am hopeful that we can stop tikosyn soon, I would like to keep him on tikosyn until I see him in February at least. Will ask Rushie Goltz to assist.

## 2017-04-20 ENCOUNTER — Telehealth: Payer: Self-pay | Admitting: Cardiology

## 2017-04-20 ENCOUNTER — Other Ambulatory Visit: Payer: Self-pay | Admitting: Physician Assistant

## 2017-04-20 NOTE — Telephone Encounter (Signed)
Is out of brand name Phyllis Ginger and he cannot find any pharmacy with brand name-  I talked with Amber, NP and will give him 30 pills to get him through with generic 125 mcg -3- TID so 30 pills and brand name should arrive on Wed.

## 2017-04-22 DIAGNOSIS — M546 Pain in thoracic spine: Secondary | ICD-10-CM | POA: Diagnosis not present

## 2017-04-22 DIAGNOSIS — M5136 Other intervertebral disc degeneration, lumbar region: Secondary | ICD-10-CM | POA: Diagnosis not present

## 2017-04-22 DIAGNOSIS — M531 Cervicobrachial syndrome: Secondary | ICD-10-CM | POA: Diagnosis not present

## 2017-04-22 DIAGNOSIS — M9902 Segmental and somatic dysfunction of thoracic region: Secondary | ICD-10-CM | POA: Diagnosis not present

## 2017-04-22 DIAGNOSIS — M9901 Segmental and somatic dysfunction of cervical region: Secondary | ICD-10-CM | POA: Diagnosis not present

## 2017-04-22 DIAGNOSIS — M9903 Segmental and somatic dysfunction of lumbar region: Secondary | ICD-10-CM | POA: Diagnosis not present

## 2017-04-24 ENCOUNTER — Encounter (INDEPENDENT_AMBULATORY_CARE_PROVIDER_SITE_OTHER): Payer: Self-pay

## 2017-04-24 NOTE — Telephone Encounter (Signed)
Pt approved for tikosyn coverage. Referral Number [id number] through 04/27/2018

## 2017-05-18 ENCOUNTER — Encounter: Payer: Self-pay | Admitting: *Deleted

## 2017-06-01 DIAGNOSIS — H6123 Impacted cerumen, bilateral: Secondary | ICD-10-CM | POA: Diagnosis not present

## 2017-06-01 DIAGNOSIS — H903 Sensorineural hearing loss, bilateral: Secondary | ICD-10-CM | POA: Diagnosis not present

## 2017-06-03 ENCOUNTER — Encounter: Payer: Self-pay | Admitting: Internal Medicine

## 2017-06-03 ENCOUNTER — Ambulatory Visit: Payer: Medicare HMO | Admitting: Internal Medicine

## 2017-06-03 VITALS — BP 126/76 | HR 65 | Ht 71.0 in | Wt 175.0 lb

## 2017-06-03 DIAGNOSIS — I480 Paroxysmal atrial fibrillation: Secondary | ICD-10-CM

## 2017-06-03 DIAGNOSIS — I481 Persistent atrial fibrillation: Secondary | ICD-10-CM

## 2017-06-03 DIAGNOSIS — I4819 Other persistent atrial fibrillation: Secondary | ICD-10-CM

## 2017-06-03 NOTE — Progress Notes (Signed)
PCP: Steele Sizer, MD    Steven Griffin is a 72 y.o. male who presents today for routine electrophysiology followup.  Since his recent afib ablation, the patient reports doing very well.  he denies procedure related complications and is pleased with the results of the procedure.  Today, he denies symptoms of palpitations, chest pain, shortness of breath,  lower extremity edema, dizziness, presyncope, or syncope.  The patient is otherwise without complaint today.   Past Medical History:  Diagnosis Date  . Allergic rhinitis, cause unspecified   . Atrial fibrillation ([place])   . CHF (congestive heart failure) ([place])   . COPD (chronic obstructive pulmonary disease) ([place])    pt denies  . Diverticulosis of colon (without mention of hemorrhage)   . Essential hypertension, benign    pt denies  . Hyperlipidemia   . Impotence of organic origin   . Mild anxiety   . Skin cancer   . Unspecified venous (peripheral) insufficiency    Past Surgical History:  Procedure Laterality Date  . ATRIAL FIBRILLATION ABLATION N/A 03/03/2017   Procedure: Atrial Fibrillation Ablation;  Surgeon: [name], MD;  Location: [facility];  Service: Cardiovascular;  Laterality: N/A;  . COLONOSCOPY WITH PROPOFOL N/A 11/20/2014   Procedure: COLONOSCOPY WITH PROPOFOL;  Surgeon: Manya Silvas, MD;  Location: [facility] ENDOSCOPY;  Service: Endoscopy;  Laterality: N/A;  . rib cartilage     benign tumor  . SKIN CANCER EXCISION Right 06/2010   lower leg  . TONSILLECTOMY AND ADENOIDECTOMY    . VEIN LIGATION AND STRIPPING      ROS- all systems are personally reviewed and negatives except as per HPI above  Current Outpatient Medications  Medication Sig Dispense Refill  . 5-Hydroxytryptophan (5-HTP) 100 MG CAPS Take 100 mg by mouth at bedtime.     Marland Kitchen b complex vitamins capsule Take 1 capsule by mouth daily.    . chlorpheniramine-HYDROcodone (TUSSIONEX PENNKINETIC ER) 10-8 MG/5ML SUER Take 5 mLs every 12 (twelve)  hours as needed by mouth. 140 mL 0  . Cholecalciferol (VITAMIN D3 PO) Take 1 capsule by mouth daily.    . Coenzyme Q10 (CO Q 10) 100 MG CAPS Take 100 mg by mouth daily.    . Digestive Enzymes (DIGESTIVE ENZYME PO) Take 1 capsule by mouth 3 (three) times daily.     . diphenhydrAMINE (BENADRYL) 25 MG tablet Take 25 mg by mouth at bedtime as needed.    . dofetilide (TIKOSYN) 125 MCG capsule Take 3 capsules (375 mcg total) by mouth 2 (two) times daily. 180 capsule 5  . finasteride (PROSCAR) 5 MG tablet Take 5 mg by mouth daily.    . Flaxseed, Linseed, (FLAXSEED OIL PO) Take 1 each by mouth See admin instructions. Take 15 ml by mouth daily    . Homeopathic Products (LEG CRAMP RELIEF PO) Take 2 tablets by mouth at bedtime.    Marland Kitchen L-Theanine 100 MG CAPS Take 100 mg by mouth 2 (two) times daily.    Marland Kitchen lisinopril (PRINIVIL,ZESTRIL) 5 MG tablet TAKE (1) TABLET BY MOUTH EVERY DAY 90 tablet 2  . LUTEIN PO Take 2 tablets by mouth daily.    Marland Kitchen MAGNESIUM PO Take 2 capsules by mouth at bedtime.     . Melatonin 3 MG CAPS Take 3 mg by mouth at bedtime.     . Menaquinone-7 (VITAMIN K2 PO) Take 1 tablet by mouth daily.    . Misc Natural Products (BETA-SITOSTEROL PLANT STEROLS) CAPS Take 1 capsule  by mouth daily.    . Misc Natural Products (GLUCOSAMINE CHOND MSM FORMULA PO) Take 2 capsules by mouth 2 (two) times daily.    . Multiple Vitamin (MULTIVITAMIN) tablet Take 1 tablet by mouth daily.    . NON FORMULARY Take 1 tablet by mouth daily. AMPK Supplement    . NON FORMULARY Take 1,000 mg by mouth 2 (two) times daily after a meal. CLA 1000 mg Supplement    . Omega-3 Fatty Acids (FISH OIL) 1000 MG CAPS Take 1,000 mg by mouth daily.     Marland Kitchen OVER THE COUNTER MEDICATION Take 1 capsule by mouth daily. Bacopa Supplement    . OVER THE COUNTER MEDICATION Take 2 tablets by mouth daily. Carb Shredder Supplement    . pantoprazole (PROTONIX) 40 MG tablet TAKE (1) TABLET BY MOUTH EVERY DAY 30 tablet 9  . Polyvinyl Alcohol-Povidone  (REFRESH OP) Place 2 drops into both eyes as needed (for dry eyes).    . Probiotic Product (PROBIOTIC DAILY PO) Take 1 capsule by mouth daily.     . rivaroxaban (XARELTO) 20 MG TABS tablet Take 1 tablet (20 mg total) by mouth daily with supper. 30 tablet 6  . Selenium 100 MCG CAPS Take 100 mcg by mouth daily.    . sildenafil (REVATIO) 20 MG tablet Take 1-5 tablets (20-100 mg total) by mouth daily as needed. (Patient taking differently: Take 20-100 mg by mouth daily as needed (for ED). ) 50 tablet 2  . tamsulosin (FLOMAX) 0.4 MG CAPS capsule TAKE (1) CAPSULE BY MOUTH EVERY DAY (Patient taking differently: TAKE 0.4 MG BY MOUTH EVERY DAY) 90 capsule 1  . TURMERIC CURCUMIN PO Take 900 mg by mouth daily.    Marland Kitchen zinc gluconate 50 MG tablet Take 50 mg by mouth daily.    Marland Kitchen diltiazem (CARDIZEM) 30 MG tablet Take 30 mg by mouth 4 (four) times daily as needed (for heart).      No current facility-administered medications for this visit.     Physical Exam: Vitals:   06/03/17 0942  BP: 126/76  Pulse: 65  Weight: 175 lb (79.4 kg)  Height: 5\' 11"  (1.803 m)    GEN- The patient is well appearing, alert and oriented x 3 today.   Head- normocephalic, atraumatic Eyes-  Sclera clear, conjunctiva pink Ears- hearing intact Oropharynx- clear Lungs- Clear to ausculation bilaterally, normal work of breathing Heart- Regular rate and rhythm, no murmurs, rubs or gallops, PMI not laterally displaced GI- soft, NT, ND, + BS Extremities- no clubbing, cyanosis, or edema  EKG tracing ordered today is personally reviewed and shows sinus rhythm 65 bpm, RBBB, LAD  Assessment and Plan:  1. Persistent atrial fibrillation Doing well s/p ablation Stop tikosyn chads2vasc score is 1 He wishes to stop xarelto also.  We discussed options of continuing this long term.  He is clear that he wishes to stop xarelto at this time.  Return to see me in 3 months  [name] MD, [facility] 06/03/2017 9:49 AM

## 2017-06-03 NOTE — Patient Instructions (Addendum)
Medication Instructions:  Your physician has recommended you make the following change in your medication:   1. STOP: Xarelto  2. STOP: Tikosyn  Labwork: None ordered  Testing/Procedures: None ordered  Follow-Up: Your physician recommends that you schedule a follow-up appointment in: 3 months with Dr. Rayann Heman    Any Other Special Instructions Will Be Listed Below (If Applicable).     If you need a refill on your cardiac medications before your next appointment, please call your pharmacy.

## 2017-07-01 DIAGNOSIS — M5136 Other intervertebral disc degeneration, lumbar region: Secondary | ICD-10-CM | POA: Diagnosis not present

## 2017-07-01 DIAGNOSIS — M9902 Segmental and somatic dysfunction of thoracic region: Secondary | ICD-10-CM | POA: Diagnosis not present

## 2017-07-01 DIAGNOSIS — M531 Cervicobrachial syndrome: Secondary | ICD-10-CM | POA: Diagnosis not present

## 2017-07-01 DIAGNOSIS — M9903 Segmental and somatic dysfunction of lumbar region: Secondary | ICD-10-CM | POA: Diagnosis not present

## 2017-07-01 DIAGNOSIS — M546 Pain in thoracic spine: Secondary | ICD-10-CM | POA: Diagnosis not present

## 2017-07-01 DIAGNOSIS — M9901 Segmental and somatic dysfunction of cervical region: Secondary | ICD-10-CM | POA: Diagnosis not present

## 2017-07-13 DIAGNOSIS — L918 Other hypertrophic disorders of the skin: Secondary | ICD-10-CM | POA: Diagnosis not present

## 2017-07-13 DIAGNOSIS — Z859 Personal history of malignant neoplasm, unspecified: Secondary | ICD-10-CM | POA: Diagnosis not present

## 2017-07-13 DIAGNOSIS — L578 Other skin changes due to chronic exposure to nonionizing radiation: Secondary | ICD-10-CM | POA: Diagnosis not present

## 2017-07-13 DIAGNOSIS — L570 Actinic keratosis: Secondary | ICD-10-CM | POA: Diagnosis not present

## 2017-08-07 ENCOUNTER — Other Ambulatory Visit: Payer: Self-pay | Admitting: Family Medicine

## 2017-08-07 DIAGNOSIS — N138 Other obstructive and reflux uropathy: Secondary | ICD-10-CM

## 2017-08-07 DIAGNOSIS — N401 Enlarged prostate with lower urinary tract symptoms: Principal | ICD-10-CM

## 2017-08-07 NOTE — Telephone Encounter (Signed)
Approved in primary's absence

## 2017-08-07 NOTE — Telephone Encounter (Signed)
Pt would like his medication called into:  [id number] - [place], [place] [phone] (Phone) [phone] (Fax)

## 2017-08-07 NOTE — Telephone Encounter (Signed)
Refill request for general medication: Flomax 0.4 mg  Last office visit: 06/03/2017  Last physical exam: 09/02/2016   Follow-ups on file. 10/30/2017

## 2017-09-07 ENCOUNTER — Encounter: Payer: Medicare HMO | Admitting: Family Medicine

## 2017-09-09 DIAGNOSIS — M9902 Segmental and somatic dysfunction of thoracic region: Secondary | ICD-10-CM | POA: Diagnosis not present

## 2017-09-09 DIAGNOSIS — M9901 Segmental and somatic dysfunction of cervical region: Secondary | ICD-10-CM | POA: Diagnosis not present

## 2017-09-09 DIAGNOSIS — M531 Cervicobrachial syndrome: Secondary | ICD-10-CM | POA: Diagnosis not present

## 2017-09-09 DIAGNOSIS — M9903 Segmental and somatic dysfunction of lumbar region: Secondary | ICD-10-CM | POA: Diagnosis not present

## 2017-09-09 DIAGNOSIS — M546 Pain in thoracic spine: Secondary | ICD-10-CM | POA: Diagnosis not present

## 2017-09-09 DIAGNOSIS — M5136 Other intervertebral disc degeneration, lumbar region: Secondary | ICD-10-CM | POA: Diagnosis not present

## 2017-09-14 DIAGNOSIS — N400 Enlarged prostate without lower urinary tract symptoms: Secondary | ICD-10-CM | POA: Diagnosis not present

## 2017-09-14 DIAGNOSIS — Z7982 Long term (current) use of aspirin: Secondary | ICD-10-CM | POA: Diagnosis not present

## 2017-09-14 DIAGNOSIS — Z809 Family history of malignant neoplasm, unspecified: Secondary | ICD-10-CM | POA: Diagnosis not present

## 2017-09-14 DIAGNOSIS — R69 Illness, unspecified: Secondary | ICD-10-CM | POA: Diagnosis not present

## 2017-09-14 DIAGNOSIS — I4891 Unspecified atrial fibrillation: Secondary | ICD-10-CM | POA: Diagnosis not present

## 2017-09-14 DIAGNOSIS — J309 Allergic rhinitis, unspecified: Secondary | ICD-10-CM | POA: Diagnosis not present

## 2017-09-14 DIAGNOSIS — Z85828 Personal history of other malignant neoplasm of skin: Secondary | ICD-10-CM | POA: Diagnosis not present

## 2017-09-18 ENCOUNTER — Ambulatory Visit: Payer: Medicare HMO | Admitting: Internal Medicine

## 2017-10-07 DIAGNOSIS — M531 Cervicobrachial syndrome: Secondary | ICD-10-CM | POA: Diagnosis not present

## 2017-10-07 DIAGNOSIS — M546 Pain in thoracic spine: Secondary | ICD-10-CM | POA: Diagnosis not present

## 2017-10-07 DIAGNOSIS — M9902 Segmental and somatic dysfunction of thoracic region: Secondary | ICD-10-CM | POA: Diagnosis not present

## 2017-10-07 DIAGNOSIS — M5136 Other intervertebral disc degeneration, lumbar region: Secondary | ICD-10-CM | POA: Diagnosis not present

## 2017-10-07 DIAGNOSIS — M9901 Segmental and somatic dysfunction of cervical region: Secondary | ICD-10-CM | POA: Diagnosis not present

## 2017-10-07 DIAGNOSIS — M9903 Segmental and somatic dysfunction of lumbar region: Secondary | ICD-10-CM | POA: Diagnosis not present

## 2017-10-07 LAB — HEPATIC FUNCTION PANEL
ALT: 18 (ref 10–40)
AST: 19 (ref 14–40)

## 2017-10-07 LAB — IRON,TIBC AND FERRITIN PANEL: Iron: 154

## 2017-10-07 LAB — CBC AND DIFFERENTIAL
HCT: 46 (ref 41–53)
HEMOGLOBIN: 15.3 (ref 13.5–17.5)
WBC: 5.6

## 2017-10-07 LAB — LIPID PANEL
CHOLESTEROL: 205 — AB (ref 0–200)
HDL: 52 (ref 35–70)
LDL Cholesterol: 120
Triglycerides: 166 — AB (ref 40–160)

## 2017-10-07 LAB — HEMOGLOBIN A1C: Hemoglobin A1C: 5.3

## 2017-10-07 LAB — BASIC METABOLIC PANEL
BUN: 15 (ref 4–21)
CREATININE: 0.9 (ref 0.6–1.3)
Glucose: 75
POTASSIUM: 4.1 (ref 3.4–5.3)
SODIUM: 142 (ref 137–147)

## 2017-10-07 LAB — PSA: PSA: 3.1

## 2017-10-07 LAB — VITAMIN D 25 HYDROXY (VIT D DEFICIENCY, FRACTURES): VIT D 25 HYDROXY: 42.8

## 2017-10-07 LAB — TSH: TSH: 2.31 (ref 0.41–5.90)

## 2017-10-27 ENCOUNTER — Ambulatory Visit: Payer: Medicare HMO

## 2017-10-27 ENCOUNTER — Telehealth: Payer: Self-pay

## 2017-10-27 NOTE — Telephone Encounter (Signed)
Pt NS'd for AWV on 10/27/17. Called pt to resched AWV w/ NHA. LVM requesting returned call.

## 2017-11-02 ENCOUNTER — Ambulatory Visit (INDEPENDENT_AMBULATORY_CARE_PROVIDER_SITE_OTHER): Payer: Medicare HMO | Admitting: Family Medicine

## 2017-11-02 ENCOUNTER — Encounter: Payer: Self-pay | Admitting: Family Medicine

## 2017-11-02 VITALS — BP 118/72 | HR 76 | Temp 98.0°F | Resp 16 | Ht 71.0 in | Wt 176.7 lb

## 2017-11-02 DIAGNOSIS — I5022 Chronic systolic (congestive) heart failure: Secondary | ICD-10-CM | POA: Diagnosis not present

## 2017-11-02 DIAGNOSIS — E7800 Pure hypercholesterolemia, unspecified: Secondary | ICD-10-CM | POA: Diagnosis not present

## 2017-11-02 DIAGNOSIS — Z8679 Personal history of other diseases of the circulatory system: Secondary | ICD-10-CM

## 2017-11-02 DIAGNOSIS — N138 Other obstructive and reflux uropathy: Secondary | ICD-10-CM | POA: Diagnosis not present

## 2017-11-02 DIAGNOSIS — Z0001 Encounter for general adult medical examination with abnormal findings: Secondary | ICD-10-CM | POA: Diagnosis not present

## 2017-11-02 DIAGNOSIS — Z0000 Encounter for general adult medical examination without abnormal findings: Secondary | ICD-10-CM

## 2017-11-02 DIAGNOSIS — N401 Enlarged prostate with lower urinary tract symptoms: Secondary | ICD-10-CM

## 2017-11-02 MED ORDER — FINASTERIDE 5 MG PO TABS: 5.0000 mg | ORAL_TABLET | Freq: Every day | ORAL | 3 refills | Status: DC

## 2017-11-02 MED ORDER — TAMSULOSIN HCL 0.4 MG PO CAPS: 0.4000 mg | ORAL_CAPSULE | Freq: Every day | ORAL | 3 refills | Status: DC

## 2017-11-02 NOTE — Patient Instructions (Signed)
Preventive Care 65 Years and Older, Male Preventive care refers to lifestyle choices and visits with your health care provider that can promote health and wellness. What does preventive care include?  A yearly physical exam. This is also called an annual well check.  Dental exams once or twice a year.  Routine eye exams. Ask your health care provider how often you should have your eyes checked.  Personal lifestyle choices, including: ? Daily care of your teeth and gums. ? Regular physical activity. ? Eating a healthy diet. ? Avoiding tobacco and drug use. ? Limiting alcohol use. ? Practicing safe sex. ? Taking low doses of aspirin every day. ? Taking vitamin and mineral supplements as recommended by your health care provider. What happens during an annual well check? The services and screenings done by your health care provider during your annual well check will depend on your age, overall health, lifestyle risk factors, and family history of disease. Counseling Your health care provider may ask you questions about your:  Alcohol use.  Tobacco use.  Drug use.  Emotional well-being.  Home and relationship well-being.  Sexual activity.  Eating habits.  History of falls.  Memory and ability to understand (cognition).  Work and work environment.  Screening You may have the following tests or measurements:  Height, weight, and BMI.  Blood pressure.  Lipid and cholesterol levels. These may be checked every 5 years, or more frequently if you are over 50 years old.  Skin check.  Lung cancer screening. You may have this screening every year starting at age 55 if you have a 30-pack-year history of smoking and currently smoke or have quit within the past 15 years.  Fecal occult blood test (FOBT) of the stool. You may have this test every year starting at age 50.  Flexible sigmoidoscopy or colonoscopy. You may have a sigmoidoscopy every 5 years or a colonoscopy every 10  years starting at age 50.  Prostate cancer screening. Recommendations will vary depending on your family history and other risks.  Hepatitis C blood test.  Hepatitis B blood test.  Sexually transmitted disease (STD) testing.  Diabetes screening. This is done by checking your blood sugar (glucose) after you have not eaten for a while (fasting). You may have this done every 1-3 years.  Abdominal aortic aneurysm (AAA) screening. You may need this if you are a current or former smoker.  Osteoporosis. You may be screened starting at age 70 if you are at high risk.  Talk with your health care provider about your test results, treatment options, and if necessary, the need for more tests. Vaccines Your health care provider may recommend certain vaccines, such as:  Influenza vaccine. This is recommended every year.  Tetanus, diphtheria, and acellular pertussis (Tdap, Td) vaccine. You may need a Td booster every 10 years.  Varicella vaccine. You may need this if you have not been vaccinated.  Zoster vaccine. You may need this after age 60.  Measles, mumps, and rubella (MMR) vaccine. You may need at least one dose of MMR if you were born in 1957 or later. You may also need a second dose.  Pneumococcal 13-valent conjugate (PCV13) vaccine. One dose is recommended after age 65.  Pneumococcal polysaccharide (PPSV23) vaccine. One dose is recommended after age 65.  Meningococcal vaccine. You may need this if you have certain conditions.  Hepatitis A vaccine. You may need this if you have certain conditions or if you travel or work in places where you   may be exposed to hepatitis A.  Hepatitis B vaccine. You may need this if you have certain conditions or if you travel or work in places where you may be exposed to hepatitis B.  Haemophilus influenzae type b (Hib) vaccine. You may need this if you have certain risk factors.  Talk to your health care provider about which screenings and vaccines  you need and how often you need them. This information is not intended to replace advice given to you by your health care provider. Make sure you discuss any questions you have with your health care provider. Document Released: 05/11/2015 Document Revised: 01/02/2016 Document Reviewed: 02/13/2015 Elsevier Interactive Patient Education  2018 [employer]  

## 2017-11-02 NOTE — Progress Notes (Signed)
Name: Steven Griffin   MRN: [id number]    DOB: 02-12-1946   Date:11/02/2017       Progress Note  Subjective  Chief Complaint  Chief Complaint  Patient presents with  . Annual Exam    patient sleeps about 8 hrs/ night and tries to eat a well balanced diet    HPI  Patient presents for annual CPE and follow up  He brought a copy of his labs for my review today  Hyperlipidemia: he does not want to take statin therapy   History of afib and CHF: had ablation, denies orthopnea, he has mild leg swelling that resolves by the morning. No SOB or wheezing.   BPH: seen by Dr. Rogers Blocker and he is doing well with Finasteride and Tamsulosin. He also takes generic viagra prn. AUA is low today. Denies side effects of medication, such as dizziness.   USPSTF grade A and B recommendations:  Diet: he eats healthy Exercise: he is active, uses his home gym daily for one hour  Depression:  Depression screen [facility] 2/9 11/02/2017 09/02/2016 08/05/2016 01/18/2016 08/31/2015  Decreased Interest 0 0 0 0 0  Down, Depressed, Hopeless 0 0 0 0 0  PHQ - 2 Score 0 0 0 0 0    Hypertension:  BP Readings from Last 3 Encounters:  11/02/17 118/72  06/03/17 126/76  04/03/17 132/78    Obesity: Wt Readings from Last 3 Encounters:  11/02/17 176 lb 11.2 oz (80.2 kg)  06/03/17 175 lb (79.4 kg)  04/03/17 176 lb 12.8 oz (80.2 kg)   BMI Readings from Last 3 Encounters:  11/02/17 24.64 kg/m  06/03/17 24.41 kg/m  04/03/17 24.66 kg/m     Lipids:  Lab Results  Component Value Date   CHOL 205 (A) 10/07/2017   CHOL 151 12/26/2016   CHOL 185 08/31/2015   Lab Results  Component Value Date   HDL 52 10/07/2017   HDL 52 12/26/2016   HDL 61 08/31/2015   Lab Results  Component Value Date   LDLCALC 120 10/07/2017   LDLCALC 76 12/26/2016   LDLCALC 110 (H) 08/31/2015   Lab Results  Component Value Date   TRIG 166 (A) 10/07/2017   TRIG 113 12/26/2016   TRIG 68 08/31/2015   Lab Results  Component Value Date    CHOLHDL 3.0 08/31/2015   No results found for: LDLDIRECT Glucose:  Glucose  Date Value Ref Range Status  02/20/2017 92 65 - 99 mg/dL Final  12/18/2016 85 65 - 99 mg/dL Final  08/31/2015 107 (H) 65 - 99 mg/dL Final    Alcohol: drinks wine - red 2 glasses per night  Tobacco use: states does not inhale but can go through 2 packs day for over 70 years  Married STD testing and prevention (chl/gon/syphilis): N/A HIV, hep C: N/A  Skin cancer: discussed change in skin lesions Colorectal cancer: 2016 - repeat in 10 years Prostate cancer: PSA is back to normal  Lab Results  Component Value Date   PSA 3.1 10/07/2017   PSA 3.9 01/27/2017   PSA 4.66 06/21/2012    IPSS Questionnaire (AUA-7): Over the past month.   1)  How often have you had a sensation of not emptying your bladder completely after you finish urinating?  0 - Not at all  2)  How often have you had to urinate again less than two hours after you finished urinating? 1 - Less than 1 time in 5  3)  How often have you found  you stopped and started again several times when you urinated?  1 - Less than 1 time in 5  4) How difficult have you found it to postpone urination?  3 - About half the time  5) How often have you had a weak urinary stream?  1 - Less than 1 time in 5  6) How often have you had to push or strain to begin urination?  0 - Not at all  7) How many times did you most typically get up to urinate from the time you went to bed until the time you got up in the morning?  0 - None  Total score:  0-7 mildly symptomatic   8-19 moderately symptomatic   20-35 severely symptomatic   Lung cancer:   Low Dose CT Chest recommended if Age 64-80 years, 30 pack-year currently smoking OR have quit w/in 15years. Patient does qualify. He states he does not inhale  AAA:  The USPSTF recommends one-time screening with ultrasonography in men ages 90 to 66 years who have ever smoked, had normal aorta during cardiac cath done  02/25/2017  Aspirin: daily  ECG:  05/2017   Advanced Care Planning: A voluntary discussion about advance care planning including the explanation and discussion of advance directives.  Discussed health care proxy and Living will, and the patient was able to identify a health care proxy as wife.  Patient does have a living will at present time. If patient does have living will, I have requested they bring this to the clinic to be scanned in to their chart.  Patient Active Problem List   Diagnosis Date Noted  . Paroxysmal atrial fibrillation ([place]) 03/03/2017  . Bradycardia 08/31/2015  . Muscle tear 04/20/2015  . Allergic rhinitis, seasonal 04/20/2015  . Chronic venous insufficiency 10/27/2014  . Intermittent tremor 10/27/2014  . ED (erectile dysfunction) of non-organic origin 10/27/2014  . Colon, diverticulosis 10/27/2014  . Benign prostatic hypertrophy without urinary obstruction 10/27/2014  . Systolic CHF, chronic ([place]) 08/08/2013  . Bundle branch block, right and left anterior fascicular 06/07/2013  . Atrial fibrillation ([place]) 10/20/2012  . Obstructive apnea 03/23/2012  . Polypharmacy 04/15/2011  . Hypercholesteremia 01/18/2011  . H/O squamous cell carcinoma of skin 01/18/2011  . History of major vascular surgery 01/18/2011    Past Surgical History:  Procedure Laterality Date  . ATRIAL FIBRILLATION ABLATION N/A 03/03/2017   Procedure: Atrial Fibrillation Ablation;  Surgeon: [name], MD;  Location: [facility];  Service: Cardiovascular;  Laterality: N/A;  . COLONOSCOPY WITH PROPOFOL N/A 11/20/2014   Procedure: COLONOSCOPY WITH PROPOFOL;  Surgeon: Manya Silvas, MD;  Location: [facility] ENDOSCOPY;  Service: Endoscopy;  Laterality: N/A;  . rib cartilage     benign tumor  . SKIN CANCER EXCISION Right 06/2010   lower leg  . TONSILLECTOMY AND ADENOIDECTOMY    . VEIN LIGATION AND STRIPPING      Family History  Problem Relation Age of Onset  . Suicidality Mother   .  Leukemia Father   . Hypercholesterolemia Father   . Colon cancer Maternal Grandmother   . Hypertension Brother   . Hyperlipidemia Brother   . Cirrhosis Brother   . Alcoholism Brother     Social History   Socioeconomic History  . Marital status: Married    Spouse name: Neoma Laming  . Number of children: 3  . Years of education: 27  . Highest education level: Not on file  Occupational History  . Occupation: Stage manager  Needs  . Financial resource strain: Not very hard  . Food insecurity:    Worry: Never true    Inability: Never true  . Transportation needs:    Medical: No    Non-medical: No  Tobacco Use  . Smoking status: Current Every Day Smoker    Packs/day: 1.00    Years: 35.00    Pack years: 35.00    Types: Cigarettes, Cigars  . Smokeless tobacco: Never Used  . Tobacco comment: patient uses natural tobacco (rolls his own)  Substance and Sexual Activity  . Alcohol use: Yes    Alcohol/week: 0.6 oz    Types: 1 Glasses of wine per week    Comment: 2-3 glasses of wine at night  . Drug use: No  . Sexual activity: Yes    Partners: Female    Birth control/protection: None  Lifestyle  . Physical activity:    Days per week: 6 days    Minutes per session: 30 min  . Stress: Not at all  Relationships  . Social connections:    Talks on phone: More than three times a week    Gets together: Twice a week    Attends religious service: More than 4 times per year    Active member of club or organization: Yes    Attends meetings of clubs or organizations: 1 to 4 times per year    Relationship status: Married  . Intimate partner violence:    Fear of current or ex partner: No    Emotionally abused: No    Physically abused: No    Forced sexual activity: No  Other Topics Concern  . Not on file  Social History Narrative   Pt lives in [place] with spouse.  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     Current Outpatient Medications:  .   5-Hydroxytryptophan (5-HTP) 100 MG CAPS, Take 100 mg by mouth at bedtime. , Disp: , Rfl:  .  b complex vitamins capsule, Take 1 capsule by mouth daily., Disp: , Rfl:  .  Cholecalciferol (VITAMIN D3 PO), Take 1 capsule by mouth daily., Disp: , Rfl:  .  Coenzyme Q10 (CO Q 10) 100 MG CAPS, Take 100 mg by mouth daily., Disp: , Rfl:  .  Digestive Enzymes (DIGESTIVE ENZYME PO), Take 1 capsule by mouth 3 (three) times daily. , Disp: , Rfl:  .  diphenhydrAMINE (BENADRYL) 25 MG tablet, Take 25 mg by mouth at bedtime as needed., Disp: , Rfl:  .  finasteride (PROSCAR) 5 MG tablet, Take 1 tablet (5 mg total) by mouth daily., Disp: 90 tablet, Rfl: 3 .  Flaxseed, Linseed, (FLAXSEED OIL PO), Take 1 each by mouth See admin instructions. Take 15 ml by mouth daily, Disp: , Rfl:  .  Homeopathic Products (LEG CRAMP RELIEF PO), Take 2 tablets by mouth at bedtime., Disp: , Rfl:  .  L-Theanine 100 MG CAPS, Take 100 mg by mouth 2 (two) times daily., Disp: , Rfl:  .  LUTEIN PO, Take 2 tablets by mouth daily., Disp: , Rfl:  .  MAGNESIUM PO, Take 2 capsules by mouth at bedtime. , Disp: , Rfl:  .  Melatonin 3 MG CAPS, Take 3 mg by mouth at bedtime. , Disp: , Rfl:  .  Menaquinone-7 (VITAMIN K2 PO), Take 1 tablet by mouth daily., Disp: , Rfl:  .  Misc Natural Products (BETA-SITOSTEROL PLANT STEROLS) CAPS, Take 1 capsule by mouth daily., Disp: , Rfl:  .  Misc  Natural Products (GLUCOSAMINE CHOND MSM FORMULA PO), Take 2 capsules by mouth 2 (two) times daily., Disp: , Rfl:  .  Multiple Vitamin (MULTIVITAMIN) tablet, Take 1 tablet by mouth daily., Disp: , Rfl:  .  NON FORMULARY, Take 1 tablet by mouth daily. AMPK Supplement, Disp: , Rfl:  .  NON FORMULARY, Take 1,000 mg by mouth 2 (two) times daily after a meal. CLA 1000 mg Supplement, Disp: , Rfl:  .  Omega-3 Fatty Acids (FISH OIL) 1000 MG CAPS, Take 1,000 mg by mouth daily. , Disp: , Rfl:  .  OVER THE COUNTER MEDICATION, Take 1 capsule by mouth daily. Bacopa Supplement, Disp: ,  Rfl:  .  OVER THE COUNTER MEDICATION, Take 2 tablets by mouth daily. Carb Shredder Supplement, Disp: , Rfl:  .  pantoprazole (PROTONIX) 40 MG tablet, TAKE (1) TABLET BY MOUTH EVERY DAY, Disp: 30 tablet, Rfl: 9 .  Polyvinyl Alcohol-Povidone (REFRESH OP), Place 2 drops into both eyes as needed (for dry eyes)., Disp: , Rfl:  .  Probiotic Product (PROBIOTIC DAILY PO), Take 1 capsule by mouth daily. , Disp: , Rfl:  .  Selenium 100 MCG CAPS, Take 100 mcg by mouth daily., Disp: , Rfl:  .  sildenafil (REVATIO) 20 MG tablet, Take 1-5 tablets (20-100 mg total) by mouth daily as needed. (Patient taking differently: Take 20-100 mg by mouth daily as needed (for ED). ), Disp: 50 tablet, Rfl: 2 .  tamsulosin (FLOMAX) 0.4 MG CAPS capsule, Take 1 capsule (0.4 mg total) by mouth daily., Disp: 90 capsule, Rfl: 3 .  TURMERIC CURCUMIN PO, Take 900 mg by mouth daily., Disp: , Rfl:  .  zinc gluconate 50 MG tablet, Take 50 mg by mouth daily., Disp: , Rfl:  .  diltiazem (CARDIZEM) 30 MG tablet, Take 30 mg by mouth 4 (four) times daily as needed (for heart). , Disp: , Rfl:   No Known Allergies   ROS   Constitutional: Negative for fever or weight change.  Respiratory: Negative for cough and shortness of breath.   Cardiovascular: Negative for chest pain or  palpitations.  Gastrointestinal: Negative for abdominal pain, no bowel changes.  Musculoskeletal: Negative for gait problem or joint swelling.  Skin: Negative for rash.  Neurological: Negative for dizziness or headache.  No other specific complaints in a complete review of systems (except as listed in HPI above).   Objective  Vitals:   11/02/17 1127  BP: 118/72  Pulse: 76  Resp: 16  Temp: 98 F (36.7 C)  TempSrc: Oral  SpO2: 98%  Weight: 176 lb 11.2 oz (80.2 kg)  Height: 5\' 11"  (1.803 m)    Body mass index is 24.64 kg/m.  Physical Exam  Constitutional: Patient appears well-developed and well-nourished. No distress.  HENT: Head: Normocephalic  and atraumatic. Ears: B TMs ok, no erythema or effusion; Nose: Nose normal. Mouth/Throat: Oropharynx is clear and moist. No oropharyngeal exudate.  Eyes: Conjunctivae and EOM are normal. Pupils are equal, round, and reactive to light. No scleral icterus.  Neck: Normal range of motion. Neck supple. No JVD present. No thyromegaly present.  Cardiovascular: Normal rate, regular rhythm and normal heart sounds.  No murmur heard. No BLE edema. Pulmonary/Chest: Effort normal and breath sounds normal. No respiratory distress. Abdominal: Soft. Bowel sounds are normal, no distension. There is no tenderness. no masses MALE GENITALIA: Normal descended testes bilaterally, no masses palpated, no hernias, no lesions, no discharge RECTAL: Prostate enlarged in  Size, smooth  consistency, no rectal masses, he  has external hemorrhoids.  Musculoskeletal: Normal range of motion, no joint effusions. No gross deformities Neurological: he is alert and oriented to person, place, and time. No cranial nerve deficit. Coordination, balance, strength, speech and gait are normal.  Skin: Skin is warm and dry. No rash noted. No erythema.  Psychiatric: Patient has a normal mood and affect. behavior is normal. Judgment and thought content normal.  Recent Results (from the past 2160 hour(s))  Iron, TIBC and Ferritin Panel     Status: None   Collection Time: 10/07/17 12:00 AM  Result Value Ref Range   Iron 154   CBC and differential     Status: None   Collection Time: 10/07/17 12:00 AM  Result Value Ref Range   Hemoglobin 15.3 13.5 - 17.5   HCT 46 41 - 53   WBC 5.6   VITAMIN D 25 Hydroxy (Vit-D Deficiency, Fractures)     Status: None   Collection Time: 10/07/17 12:00 AM  Result Value Ref Range   Vit D, 25-Hydroxy 44.3   Basic metabolic panel     Status: None   Collection Time: 10/07/17 12:00 AM  Result Value Ref Range   Glucose 75    BUN 15 4 - 21   Creatinine 0.9 0.6 - 1.3   Potassium 4.1 3.4 - 5.3   Sodium 142 137 -  147  Lipid panel     Status: Abnormal   Collection Time: 10/07/17 12:00 AM  Result Value Ref Range   Triglycerides 166 (A) 40 - 160   Cholesterol 205 (A) 0 - 200   HDL 52 35 - 70   LDL Cholesterol 120   Hepatic function panel     Status: None   Collection Time: 10/07/17 12:00 AM  Result Value Ref Range   ALT 18 10 - 40   AST 19 14 - 40  Hemoglobin A1c     Status: None   Collection Time: 10/07/17 12:00 AM  Result Value Ref Range   Hemoglobin A1C 5.3   PSA     Status: None   Collection Time: 10/07/17 12:00 AM  Result Value Ref Range   PSA 3.1     Comment: Free, PSA=0.68  TSH     Status: None   Collection Time: 10/07/17 12:00 AM  Result Value Ref Range   TSH 2.31 0.41 - 5.90      PHQ2/9: Depression screen [facility] 2/9 11/02/2017 09/02/2016 08/05/2016 01/18/2016 08/31/2015  Decreased Interest 0 0 0 0 0  Down, Depressed, Hopeless 0 0 0 0 0  PHQ - 2 Score 0 0 0 0 0     Fall Risk: Fall Risk  11/02/2017 09/02/2016 09/02/2016 08/05/2016 01/18/2016  Falls in the past year? Yes Yes No No No  Comment - tripped on a suit case - - -  Number falls in past yr: 1 - - - -  Injury with Fall? Yes Yes - - -     Functional Status Survey: Is the patient deaf or have difficulty hearing?: No Does the patient have difficulty seeing, even when wearing glasses/contacts?: No Does the patient have difficulty concentrating, remembering, or making decisions?: No Does the patient have difficulty walking or climbing stairs?: No Does the patient have difficulty dressing or bathing?: No Does the patient have difficulty doing errands alone such as visiting a doctor's office or shopping?: No    Assessment & Plan  1. Physical exam, annual  Discussed importance of 150 minutes of physical activity weekly, eat two  servings of fish weekly, eat one serving of tree nuts ( cashews, pistachios, pecans, almonds.Marland Kitchen) every other day, eat 6 servings of fruit/vegetables daily and drink plenty of water and avoid sweet beverages.    2. Benign prostatic hyperplasia with urinary obstruction  - tamsulosin (FLOMAX) 0.4 MG CAPS capsule; Take 1 capsule (0.4 mg total) by mouth daily.  Dispense: 90 capsule; Refill: 3 - finasteride (PROSCAR) 5 MG tablet; Take 1 tablet (5 mg total) by mouth daily.  Dispense: 90 tablet; Refill: 3  3. Hypercholesteremia  LDL above 120 and also high Apo B but refuses statin therapy   4. Systolic CHF, chronic ([place])  On previous notes, he has been symptom free  5. Personal history of atrial fibrillation  Doing well since ablation Fall 2018

## 2017-11-23 ENCOUNTER — Encounter: Payer: Self-pay | Admitting: Internal Medicine

## 2017-11-23 ENCOUNTER — Ambulatory Visit: Payer: Medicare HMO | Admitting: Internal Medicine

## 2017-11-23 VITALS — BP 116/62 | HR 70 | Ht 71.0 in | Wt 176.2 lb

## 2017-11-23 DIAGNOSIS — I4819 Other persistent atrial fibrillation: Secondary | ICD-10-CM

## 2017-11-23 DIAGNOSIS — I481 Persistent atrial fibrillation: Secondary | ICD-10-CM | POA: Diagnosis not present

## 2017-11-23 NOTE — Patient Instructions (Addendum)
Medication Instructions:  Your physician recommends that you continue on your current medications as directed. Please refer to the Current Medication list given to you today.  Labwork: None ordered.  Testing/Procedures: None ordered.  Follow-Up:  Your physician wants you to follow-up in: 4 months with Dr. Fletcher Anon.  Your physician wants you to follow-up in: 8 months with Dr. Rayann Heman.      Any Other Special Instructions Will Be Listed Below (If Applicable).  If you need a refill on your cardiac medications before your next appointment, please call your pharmacy.

## 2017-11-23 NOTE — Progress Notes (Signed)
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 male who presents today for routine electrophysiology followup.  Since last being seen in our clinic, the patient reports doing very well.  No symptomatic afib episodes since his last visit.  He has stopped [place].  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  He has a gym at his home and works out/ plays golf frequently.  Today, he denies symptoms of palpitations, chest pain, shortness of breath,  lower extremity edema, dizziness, presyncope, or syncope.  The patient is otherwise without complaint today.   Past Medical History:  Diagnosis Date  . Allergic rhinitis, cause unspecified   . Atrial fibrillation ([place])   . CHF (congestive heart failure) ([place])   . COPD (chronic obstructive pulmonary disease) ([place])    pt denies  . Diverticulosis of colon (without mention of hemorrhage)   . Essential hypertension, benign    pt denies  . Hyperlipidemia   . Impotence of organic origin   . Mild anxiety   . Skin cancer   . Unspecified venous (peripheral) insufficiency    Past Surgical History:  Procedure Laterality Date  . ATRIAL FIBRILLATION ABLATION N/A 03/03/2017   Procedure: Atrial Fibrillation Ablation;  Surgeon: [name], MD;  Location: [facility];  Service: Cardiovascular;  Laterality: N/A;  . COLONOSCOPY WITH PROPOFOL N/A 11/20/2014   Procedure: COLONOSCOPY WITH PROPOFOL;  Surgeon: Manya Silvas, MD;  Location: [facility] ENDOSCOPY;  Service: Endoscopy;  Laterality: N/A;  . rib cartilage     benign tumor  . SKIN CANCER EXCISION Right 06/2010   lower leg  . TONSILLECTOMY AND ADENOIDECTOMY    . VEIN LIGATION AND STRIPPING      ROS- all systems are reviewed and negatives except as per HPI above  Current Outpatient Medications  Medication Sig Dispense Refill  . 5-Hydroxytryptophan (5-HTP) 100 MG CAPS Take 100 mg by mouth at bedtime.     Marland Kitchen b complex vitamins  capsule Take 1 capsule by mouth daily.    . Cholecalciferol (VITAMIN D3 PO) Take 1 capsule by mouth daily.    . Coenzyme Q10 (CO Q 10) 100 MG CAPS Take 100 mg by mouth daily.    . Digestive Enzymes (DIGESTIVE ENZYME PO) Take 1 capsule by mouth 3 (three) times daily.     Marland Kitchen diltiazem (CARDIZEM) 30 MG tablet Take 30 mg by mouth 4 (four) times daily as needed (for heart).     . diphenhydrAMINE (BENADRYL) 25 MG tablet Take 25 mg by mouth at bedtime as needed.    . finasteride (PROSCAR) 5 MG tablet Take 1 tablet (5 mg total) by mouth daily. 90 tablet 3  . Flaxseed, Linseed, (FLAXSEED OIL PO) Take 1 each by mouth See admin instructions. Take 15 ml by mouth daily    . Homeopathic Products (LEG CRAMP RELIEF PO) Take 2 tablets by mouth at bedtime.    Marland Kitchen L-Theanine 100 MG CAPS Take 100 mg by mouth 2 (two) times daily.    . LUTEIN PO Take 2 tablets by mouth daily.    Marland Kitchen MAGNESIUM PO Take 2 capsules by mouth at bedtime.     . Melatonin 3 MG CAPS Take 3 mg by mouth at bedtime.     . Menaquinone-7 (VITAMIN K2 PO) Take 1 tablet by mouth daily.    . Misc Natural Products (BETA-SITOSTEROL PLANT STEROLS) CAPS Take 1 capsule by mouth  daily.    . Misc Natural Products ([place]arker MSM FORMULA PO) Take 2 capsules by mouth 2 (two) times daily.    . Multiple Vitamin (MULTIVITAMIN) tablet Take 1 tablet by mouth daily.    . NON FORMULARY Take 1 tablet by mouth daily. AMPK Supplement    . NON FORMULARY Take 1,000 mg by mouth 2 (two) times daily after a meal. CLA 1000 mg Supplement    . Omega-3 Fatty Acids (FISH OIL) 1000 MG CAPS Take 1,000 mg by mouth daily.     Marland Kitchen OVER THE COUNTER MEDICATION Take 1 capsule by mouth daily. Bacopa Supplement    . OVER THE COUNTER MEDICATION Take 2 tablets by mouth daily. Carb Shredder Supplement    . pantoprazole (PROTONIX) 40 MG tablet TAKE (1) TABLET BY MOUTH EVERY DAY 30 tablet 9  . Polyvinyl Alcohol-Povidone (REFRESH OP) Place 2 drops into both eyes as needed (for dry eyes).    .  Probiotic Product (PROBIOTIC DAILY PO) Take 1 capsule by mouth daily.     . Selenium 100 MCG CAPS Take 100 mcg by mouth daily.    . sildenafil (REVATIO) 20 MG tablet Take 1-5 tablets (20-100 mg total) by mouth daily as needed. (Patient taking differently: Take 20-100 mg by mouth daily as needed (for ED). ) 50 tablet 2  . tamsulosin (FLOMAX) 0.4 MG CAPS capsule Take 1 capsule (0.4 mg total) by mouth daily. 90 capsule 3  . TURMERIC CURCUMIN PO Take 900 mg by mouth daily.    Marland Kitchen zinc gluconate 50 MG tablet Take 50 mg by mouth daily.     No current facility-administered medications for this visit.     Physical Exam: Vitals:   11/23/17 1427 11/23/17 1431  Pulse:  70  Weight:  176 lb 3.2 oz (79.9 kg)  Height: 5\' 11"  (1.803 m) 5\' 11"  (1.803 m)    GEN- The patient is well appearing, alert and oriented x 3 today.   Head- normocephalic, atraumatic Eyes-  Sclera clear, conjunctiva pink Ears- hearing intact Oropharynx- clear Lungs- Clear to ausculation bilaterally, normal work of breathing Heart- Regular rate and rhythm, no murmurs, rubs or gallops, PMI not laterally displaced GI- soft, NT, ND, + BS Extremities- no clubbing, cyanosis, or edema  Wt Readings from Last 3 Encounters:  11/23/17 176 lb 3.2 oz (79.9 kg)  11/02/17 176 lb 11.2 oz (80.2 kg)  06/03/17 175 lb (79.4 kg)    EKG tracing ordered today is personally reviewed and shows sinus rhythm with PACs, RBBB, LAHB  Assessment and Plan:  1. Persistent afib Doing well s/p ablation off tikosyn chads2vasc score is 1.  Not on anticoagulation as supported by guidelines.  Return to see Dr Fletcher Anon in 3-4 months I will see again in 9 months  [name] MD, [facility] 11/23/2017 2:34 PM

## 2017-12-10 DIAGNOSIS — M546 Pain in thoracic spine: Secondary | ICD-10-CM | POA: Diagnosis not present

## 2017-12-10 DIAGNOSIS — M9902 Segmental and somatic dysfunction of thoracic region: Secondary | ICD-10-CM | POA: Diagnosis not present

## 2017-12-10 DIAGNOSIS — M9901 Segmental and somatic dysfunction of cervical region: Secondary | ICD-10-CM | POA: Diagnosis not present

## 2017-12-10 DIAGNOSIS — M531 Cervicobrachial syndrome: Secondary | ICD-10-CM | POA: Diagnosis not present

## 2017-12-10 DIAGNOSIS — M5136 Other intervertebral disc degeneration, lumbar region: Secondary | ICD-10-CM | POA: Diagnosis not present

## 2017-12-10 DIAGNOSIS — M9903 Segmental and somatic dysfunction of lumbar region: Secondary | ICD-10-CM | POA: Diagnosis not present

## 2018-01-01 DIAGNOSIS — M9902 Segmental and somatic dysfunction of thoracic region: Secondary | ICD-10-CM | POA: Diagnosis not present

## 2018-01-01 DIAGNOSIS — M531 Cervicobrachial syndrome: Secondary | ICD-10-CM | POA: Diagnosis not present

## 2018-01-01 DIAGNOSIS — M546 Pain in thoracic spine: Secondary | ICD-10-CM | POA: Diagnosis not present

## 2018-01-01 DIAGNOSIS — M5136 Other intervertebral disc degeneration, lumbar region: Secondary | ICD-10-CM | POA: Diagnosis not present

## 2018-01-01 DIAGNOSIS — M9903 Segmental and somatic dysfunction of lumbar region: Secondary | ICD-10-CM | POA: Diagnosis not present

## 2018-01-01 DIAGNOSIS — M9901 Segmental and somatic dysfunction of cervical region: Secondary | ICD-10-CM | POA: Diagnosis not present

## 2018-01-15 DIAGNOSIS — M9902 Segmental and somatic dysfunction of thoracic region: Secondary | ICD-10-CM | POA: Diagnosis not present

## 2018-01-15 DIAGNOSIS — M531 Cervicobrachial syndrome: Secondary | ICD-10-CM | POA: Diagnosis not present

## 2018-01-15 DIAGNOSIS — M9901 Segmental and somatic dysfunction of cervical region: Secondary | ICD-10-CM | POA: Diagnosis not present

## 2018-01-15 DIAGNOSIS — M9903 Segmental and somatic dysfunction of lumbar region: Secondary | ICD-10-CM | POA: Diagnosis not present

## 2018-01-15 DIAGNOSIS — M5136 Other intervertebral disc degeneration, lumbar region: Secondary | ICD-10-CM | POA: Diagnosis not present

## 2018-01-15 DIAGNOSIS — M546 Pain in thoracic spine: Secondary | ICD-10-CM | POA: Diagnosis not present

## 2018-01-25 ENCOUNTER — Telehealth (INDEPENDENT_AMBULATORY_CARE_PROVIDER_SITE_OTHER): Payer: Self-pay

## 2018-01-25 NOTE — Telephone Encounter (Signed)
Patient was last seen 2017 and called stating that he feels a harden on varicose vein. I spoke with Arna Medici NP and she advise for the patient to be seen in the office.The patient will be seen on 10//1/19 be Arna Medici NP

## 2018-01-26 ENCOUNTER — Ambulatory Visit (INDEPENDENT_AMBULATORY_CARE_PROVIDER_SITE_OTHER): Payer: Medicare HMO | Admitting: Nurse Practitioner

## 2018-01-26 ENCOUNTER — Encounter (INDEPENDENT_AMBULATORY_CARE_PROVIDER_SITE_OTHER): Payer: Self-pay | Admitting: Nurse Practitioner

## 2018-01-26 VITALS — BP 133/77 | HR 57 | Resp 16 | Ht 71.0 in | Wt 172.4 lb

## 2018-01-26 DIAGNOSIS — I872 Venous insufficiency (chronic) (peripheral): Secondary | ICD-10-CM

## 2018-01-26 DIAGNOSIS — I480 Paroxysmal atrial fibrillation: Secondary | ICD-10-CM

## 2018-01-26 DIAGNOSIS — F17210 Nicotine dependence, cigarettes, uncomplicated: Secondary | ICD-10-CM | POA: Diagnosis not present

## 2018-01-26 DIAGNOSIS — R69 Illness, unspecified: Secondary | ICD-10-CM | POA: Diagnosis not present

## 2018-01-26 DIAGNOSIS — I803 Phlebitis and thrombophlebitis of lower extremities, unspecified: Secondary | ICD-10-CM | POA: Diagnosis not present

## 2018-01-26 NOTE — Progress Notes (Signed)
Subjective:    Patient ID: Steven Griffin, male    DOB: Jan 03, 1946, 72 y.o.   MRN: [id number] No chief complaint on file.   HPI  Steven Griffin is a 72 y.o. male that is a self referral for painful varicose veins.  He states that many years ago he had a vein stripping done.  He states that he has not had issues with swelling however he continues to have large varicose veins.  He states that just a few days ago a varicose vein in the back of his thigh became tender and red, with a little bit of warmth.  He was concerned this could be a DVT.  The patient has not tried anything in order to relieve the pain or swelling.  The patient regularly utilizes compression socks that rise to the knee level, however this is in the mid thigh area.  The patient denies any fever, chills, nausea, vomiting, diarrhea.  The patient denies any chest pain or shortness of breath.  The patient denies any periods of prolonged inactivity, injury, or hypercoagulable disorders.  Review of Systems   Review of Systems: Negative Unless Checked Constitutional: [] Weight loss  [] Fever  [] Chills Cardiac: [] Chest pain   x-Atrial Fibrillation  [] Palpitations   [] Shortness of breath when laying flat   [] Shortness of breath with exertion. Vascular:  [] Pain in legs with walking   [x] Pain in legs with standing  [] History of DVT   [] Phlebitis   [] Swelling in legs   [x] Varicose veins   [] Non-healing ulcers Pulmonary:   [] Uses home oxygen   [] Productive cough   [] Hemoptysis   [] Wheeze  [] COPD   [] Asthma Neurologic:  [] Dizziness   [] Seizures   [] History of stroke   [] History of TIA  [] Aphasia   [] Vissual changes   [] Weakness or numbness in arm   [] Weakness or numbness in leg Musculoskeletal:   [] Joint swelling   [] Joint pain   [] Low back pain  ? History of Knee Replacement Hematologic:  [] Easy bruising  [] Easy bleeding   [] Hypercoagulable state   [] Anemic Gastrointestinal:  [] Diarrhea   [] Vomiting  [] Gastroesophageal reflux/heartburn    [] Difficulty swallowing. Genitourinary:  [] Chronic kidney disease   [] Difficult urination  [] Anuric   [] Blood in urine Skin:  [] Rashes   [] Ulcers  Psychological:  [] History of anxiety   []  History of major depression  ? Memory Difficulties     Objective:   Physical Exam  BP 133/77 (BP Location: Right Arm)   Pulse (!) 57   Resp 16   Ht 5\' 11"  (1.803 m)   Wt 172 lb 6.4 oz (78.2 kg)   BMI 24.04 kg/m   Past Medical History:  Diagnosis Date  . Allergic rhinitis, cause unspecified   . Atrial fibrillation ([place])   . CHF (congestive heart failure) ([place])   . COPD (chronic obstructive pulmonary disease) ([place])    pt denies  . Diverticulosis of colon (without mention of hemorrhage)   . Essential hypertension, benign    pt denies  . Hyperlipidemia   . Impotence of organic origin   . Mild anxiety   . Skin cancer   . Unspecified venous (peripheral) insufficiency      Gen: WD/WN, NAD Head: [place]/AT, No temporalis wasting.  Ear/Nose/Throat: Hearing grossly intact, nares w/o erythema or drainage Eyes: PER, EOMI, sclera nonicteric.  Neck: Supple, no masses.  No JVD.  Pulmonary:  Good air movement, no use of accessory muscles.  Cardiac: RRR Vascular:  Multiple scattered spider varicosities over  her left lower extremity.  Several medium sized varicose veins approximately 3 to 4 mm in size scattered through anterior and posterior leg region.  Large 10 mm varicose vein in the back of thigh area.  Firm and erythematous.  Tender but not painful. Vessel Right Left  Radial Palpable Palpable  Dorsalis Pedis Palpable Palpable  Posterior Tibial Palpable Palpable   Gastrointestinal: soft, non-distended. No guarding/no peritoneal signs.  Musculoskeletal: M/S 5/5 throughout.  No deformity or atrophy.  Neurologic: Pain and light touch intact in extremities.  Symmetrical.  Speech is fluent. Motor exam as listed above. Psychiatric: Judgment intact, Mood & affect appropriate for pt's clinical  situation. Dermatologic: No Venous rashes. No Ulcers Noted.  No changes consistent with cellulitis. Lymph : No Cervical lymphadenopathy, no lichenification or skin changes of chronic lymphedema.   Social History   Socioeconomic History  . Marital status: Married    Spouse name: Steven Griffin  . Number of children: 3  . Years of education: 64  . Highest education level: Not on file  Occupational History  . Occupation: Product manager  Social Needs  . Financial resource strain: Not very hard  . Food insecurity:    Worry: Never true    Inability: Never true  . Transportation needs:    Medical: No    Non-medical: No  Tobacco Use  . Smoking status: Current Every Day Smoker    Packs/day: 1.00    Years: 35.00    Pack years: 35.00    Types: Cigarettes, Cigars  . Smokeless tobacco: Never Used  . Tobacco comment: patient uses natural tobacco (rolls his own)  Substance and Sexual Activity  . Alcohol use: Yes    Alcohol/week: 1.0 standard drinks    Types: 1 Glasses of wine per week    Comment: 2-3 glasses of wine at night  . Drug use: No  . Sexual activity: Yes    Partners: Female    Birth control/protection: None  Lifestyle  . Physical activity:    Days per week: 6 days    Minutes per session: 30 min  . Stress: Not at all  Relationships  . Social connections:    Talks on phone: More than three times a week    Gets together: Twice a week    Attends religious service: More than 4 times per year    Active member of club or organization: Yes    Attends meetings of clubs or organizations: 1 to 4 times per year    Relationship status: Married  . Intimate partner violence:    Fear of current or ex partner: No    Emotionally abused: No    Physically abused: No    Forced sexual activity: No  Other Topics Concern  . Not on file  Social History Narrative   Pt lives in [place] with spouse.  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    Past Surgical  History:  Procedure Laterality Date  . ATRIAL FIBRILLATION ABLATION N/A 03/03/2017   Procedure: Atrial Fibrillation Ablation;  Surgeon: [name], MD;  Location: [facility];  Service: Cardiovascular;  Laterality: N/A;  . COLONOSCOPY WITH PROPOFOL N/A 11/20/2014   Procedure: COLONOSCOPY WITH PROPOFOL;  Surgeon: Manya Silvas, MD;  Location: [facility] ENDOSCOPY;  Service: Endoscopy;  Laterality: N/A;  . rib cartilage     benign tumor  . SKIN CANCER EXCISION Right 06/2010   lower leg  . TONSILLECTOMY AND ADENOIDECTOMY    . VEIN  LIGATION AND STRIPPING      Family History  Problem Relation Age of Onset  . Suicidality Mother   . Leukemia Father   . Hypercholesterolemia Father   . Colon cancer Maternal Grandmother   . Hypertension Brother   . Hyperlipidemia Brother   . Cirrhosis Brother   . Alcoholism Brother     No Known Allergies     Assessment & Plan:   1. Chronic venous insufficiency Recommend:  The patient has had successful Vein stripping of the previously incompetent saphenous venous system but still has persistent symptoms of pain and swelling that are having a negative impact on daily life and daily activities. Currently, patient has a superficial phlebitis of his varicose vein.   Patient should undergo injection sclerotherapy to treat the residual varicosities.  The risks, benefits and alternative therapies were reviewed in detail with the patient.  All questions were answered.  The patient agrees to proceed with sclerotherapy at their convenience.  The patient will continue wearing the graduated compression stockings and using the over-the-counter pain medications to treat her symptoms.      2. Paroxysmal atrial fibrillation (HCC) Continue antiarrhythmia medications as already ordered, these medications have been reviewed and there are no changes at this time.  Continue anticoagulation as ordered by Cardiology Service   3. Phlebitis of left lower  extremity Currently warm and tender.  We will go proceed with conservative measures such as warm compresses elevation and ibuprofen for pain relief.  He can also try topical analgesic measures for pain relief.  We will have him follow-up in 1 month to determine if it has resolved with conservative therapy.   Current Outpatient Medications on File Prior to Visit  Medication Sig Dispense Refill  . 5-Hydroxytryptophan (5-HTP) 100 MG CAPS Take 100 mg by mouth at bedtime.     Marland Kitchen b complex vitamins capsule Take 1 capsule by mouth daily.    . Cholecalciferol (VITAMIN D3 PO) Take 1 capsule by mouth daily.    . Coenzyme Q10 (CO Q 10) 100 MG CAPS Take 100 mg by mouth daily.    . Digestive Enzymes (DIGESTIVE ENZYME PO) Take 1 capsule by mouth 3 (three) times daily.     Marland Kitchen diltiazem (CARDIZEM) 30 MG tablet Take 30 mg by mouth 4 (four) times daily as needed (for heart).     . diphenhydrAMINE (BENADRYL) 25 MG tablet Take 25 mg by mouth at bedtime as needed.    . finasteride (PROSCAR) 5 MG tablet Take 1 tablet (5 mg total) by mouth daily. 90 tablet 3  . Flaxseed, Linseed, (FLAXSEED OIL PO) Take 1 each by mouth See admin instructions. Take 15 ml by mouth daily    . Homeopathic Products (LEG CRAMP RELIEF PO) Take 2 tablets by mouth at bedtime.    Marland Kitchen L-Theanine 100 MG CAPS Take 100 mg by mouth 2 (two) times daily.    Marland Kitchen MAGNESIUM PO Take 300 mg by mouth at bedtime.     . Melatonin 3 MG CAPS Take 3 mg by mouth at bedtime.     . Menaquinone-7 (VITAMIN K2 PO) Take 1 tablet by mouth daily.    . Misc Natural Products (BETA-SITOSTEROL PLANT STEROLS) CAPS Take 1 capsule by mouth daily.    . Misc Natural Products (GLUCOSAMINE CHOND MSM FORMULA PO) Take 2 capsules by mouth 2 (two) times daily.    . Multiple Vitamin (MULTIVITAMIN) tablet Take 1 tablet by mouth daily.    . NON FORMULARY Take 1 tablet  by mouth daily. AMPK Supplement    . NON FORMULARY Take 1,000 mg by mouth 2 (two) times daily after a meal. CLA 1000 mg  Supplement    . Omega-3 Fatty Acids (FISH OIL) 1000 MG CAPS Take 1,000 mg by mouth daily.     Marland Kitchen OVER THE COUNTER MEDICATION Take 1 capsule by mouth daily. Bacopa Supplement    . OVER THE COUNTER MEDICATION Take 2 tablets by mouth daily. Carb Shredder Supplement    . pantoprazole (PROTONIX) 40 MG tablet TAKE (1) TABLET BY MOUTH EVERY DAY 30 tablet 9  . Polyvinyl Alcohol-Povidone (REFRESH OP) Place 2 drops into both eyes as needed (for dry eyes).    . Probiotic Product (PROBIOTIC DAILY PO) Take 1 capsule by mouth daily.     . sildenafil (REVATIO) 20 MG tablet Take 1-5 tablets (20-100 mg total) by mouth daily as needed. (Patient taking differently: Take 20-100 mg by mouth daily as needed (for ED). ) 50 tablet 2  . tamsulosin (FLOMAX) 0.4 MG CAPS capsule Take 1 capsule (0.4 mg total) by mouth daily. 90 capsule 3  . TAURINE PO Take 150 mg by mouth daily.    . TURMERIC CURCUMIN PO Take 900 mg by mouth daily.    Marland Kitchen zinc gluconate 50 MG tablet Take 50 mg by mouth daily.     No current facility-administered medications on file prior to visit.     There are no Patient Instructions on file for this visit. No follow-ups on file.   Kris Hartmann, NP

## 2018-01-28 ENCOUNTER — Telehealth (INDEPENDENT_AMBULATORY_CARE_PROVIDER_SITE_OTHER): Payer: Self-pay

## 2018-01-28 NOTE — Telephone Encounter (Signed)
Mr. Henneman has a superficial phlebitis.  There is no restriction in his activities.  If he is driving for long distances it may help him to wear compression socks for possible swelling discomfort.  He can utilize a heating pad on low.  He does not need to apply consistent heat, but rather in intervals of 15 to 30 minutes.  He can also utilize a warm rag when he is not traveling.

## 2018-01-28 NOTE — Telephone Encounter (Signed)
Patient called to ask that since it has been identified that he does have a Right leg DVT, and since he has to travel and drive so much for his job, can he use a warming or heating patch to his leg while on the road? Or what do you suggest since he can elevate when he's driving?

## 2018-01-28 NOTE — Telephone Encounter (Signed)
Patient came into the office to get the answer to his questions. I gave him Fallon's advice and he was satisfied with that.

## 2018-02-10 ENCOUNTER — Encounter: Payer: Self-pay | Admitting: Internal Medicine

## 2018-02-10 DIAGNOSIS — N411 Chronic prostatitis: Secondary | ICD-10-CM | POA: Diagnosis not present

## 2018-02-10 DIAGNOSIS — R300 Dysuria: Secondary | ICD-10-CM | POA: Diagnosis not present

## 2018-02-10 DIAGNOSIS — N401 Enlarged prostate with lower urinary tract symptoms: Secondary | ICD-10-CM | POA: Diagnosis not present

## 2018-02-19 DIAGNOSIS — M5136 Other intervertebral disc degeneration, lumbar region: Secondary | ICD-10-CM | POA: Diagnosis not present

## 2018-02-19 DIAGNOSIS — M9901 Segmental and somatic dysfunction of cervical region: Secondary | ICD-10-CM | POA: Diagnosis not present

## 2018-02-19 DIAGNOSIS — M9902 Segmental and somatic dysfunction of thoracic region: Secondary | ICD-10-CM | POA: Diagnosis not present

## 2018-02-19 DIAGNOSIS — M546 Pain in thoracic spine: Secondary | ICD-10-CM | POA: Diagnosis not present

## 2018-02-19 DIAGNOSIS — M9903 Segmental and somatic dysfunction of lumbar region: Secondary | ICD-10-CM | POA: Diagnosis not present

## 2018-02-19 DIAGNOSIS — M531 Cervicobrachial syndrome: Secondary | ICD-10-CM | POA: Diagnosis not present

## 2018-02-23 ENCOUNTER — Encounter (INDEPENDENT_AMBULATORY_CARE_PROVIDER_SITE_OTHER): Payer: Self-pay | Admitting: Vascular Surgery

## 2018-02-23 ENCOUNTER — Ambulatory Visit (INDEPENDENT_AMBULATORY_CARE_PROVIDER_SITE_OTHER): Payer: Medicare HMO | Admitting: Vascular Surgery

## 2018-02-23 VITALS — BP 126/70 | HR 65 | Resp 16 | Ht 71.0 in | Wt 172.8 lb

## 2018-02-23 DIAGNOSIS — I8001 Phlebitis and thrombophlebitis of superficial vessels of right lower extremity: Secondary | ICD-10-CM

## 2018-02-23 DIAGNOSIS — I10 Essential (primary) hypertension: Secondary | ICD-10-CM | POA: Diagnosis not present

## 2018-02-23 DIAGNOSIS — I872 Venous insufficiency (chronic) (peripheral): Secondary | ICD-10-CM | POA: Diagnosis not present

## 2018-02-23 DIAGNOSIS — F17210 Nicotine dependence, cigarettes, uncomplicated: Secondary | ICD-10-CM | POA: Diagnosis not present

## 2018-02-23 DIAGNOSIS — R69 Illness, unspecified: Secondary | ICD-10-CM | POA: Diagnosis not present

## 2018-02-23 DIAGNOSIS — I809 Phlebitis and thrombophlebitis of unspecified site: Secondary | ICD-10-CM | POA: Insufficient documentation

## 2018-02-23 NOTE — Assessment & Plan Note (Signed)
The patient has a prominent superficial thrombophlebitis on his posterior thigh.  This is gradually improving.  Continue compresses and heat as tolerated.  Aspirin therapy daily of benefit.  Return in about 2 months for follow-up

## 2018-02-23 NOTE — Assessment & Plan Note (Signed)
The patient does have varicosities that would benefit from sclerotherapy going forward.  I would wait until after this thrombophlebitis resolves.

## 2018-02-23 NOTE — Progress Notes (Signed)
MRN : [id number]  Steven Griffin is a 72 y.o. (May 21, 1945) male who presents with chief complaint of  Chief Complaint  Patient presents with  . Follow-up    48month follow up  .  History of Present Illness: Patient returns today in follow up of the superficial thrombophlebitis in his right posterior thigh area.  This has become hard but less prominent.  The pain is fairly mild at this point.  He has been wrapping it and using warm compresses and done a very good job of taking care of this.  No chest pain or shortness of breath.  Current Outpatient Medications  Medication Sig Dispense Refill  . 5-Hydroxytryptophan (5-HTP) 100 MG CAPS Take 100 mg by mouth at bedtime.     Marland Kitchen b complex vitamins capsule Take 1 capsule by mouth daily.    . Cholecalciferol (VITAMIN D3 PO) Take 1 capsule by mouth daily.    . Coenzyme Q10 (CO Q 10) 100 MG CAPS Take 100 mg by mouth daily.    . Digestive Enzymes (DIGESTIVE ENZYME PO) Take 1 capsule by mouth 3 (three) times daily.     Marland Kitchen diltiazem (CARDIZEM) 30 MG tablet Take 30 mg by mouth 4 (four) times daily as needed (for heart).     . diphenhydrAMINE (BENADRYL) 25 MG tablet Take 25 mg by mouth at bedtime as needed.    . finasteride (PROSCAR) 5 MG tablet Take 1 tablet (5 mg total) by mouth daily. 90 tablet 3  . Flaxseed, Linseed, (FLAXSEED OIL PO) Take 1 each by mouth See admin instructions. Take 15 ml by mouth daily    . Homeopathic Products (LEG CRAMP RELIEF PO) Take 2 tablets by mouth at bedtime.    Marland Kitchen L-Theanine 100 MG CAPS Take 100 mg by mouth 2 (two) times daily.    Marland Kitchen MAGNESIUM PO Take 300 mg by mouth at bedtime.     . Melatonin 3 MG CAPS Take 3 mg by mouth at bedtime.     . Menaquinone-7 (VITAMIN K2 PO) Take 1 tablet by mouth daily.    . Misc Natural Products (BETA-SITOSTEROL PLANT STEROLS) CAPS Take 1 capsule by mouth daily.    . Misc Natural Products (GLUCOSAMINE CHOND MSM FORMULA PO) Take 2 capsules by mouth 2 (two) times daily.    . Multiple Vitamin  (MULTIVITAMIN) tablet Take 1 tablet by mouth daily.    . NON FORMULARY Take 1 tablet by mouth daily. AMPK Supplement    . NON FORMULARY Take 1,000 mg by mouth 2 (two) times daily after a meal. CLA 1000 mg Supplement    . Omega-3 Fatty Acids (FISH OIL) 1000 MG CAPS Take 1,000 mg by mouth daily.     Marland Kitchen OVER THE COUNTER MEDICATION Take 1 capsule by mouth daily. Bacopa Supplement    . OVER THE COUNTER MEDICATION Take 2 tablets by mouth daily. Carb Shredder Supplement    . pantoprazole (PROTONIX) 40 MG tablet TAKE (1) TABLET BY MOUTH EVERY DAY 30 tablet 9  . Polyvinyl Alcohol-Povidone (REFRESH OP) Place 2 drops into both eyes as needed (for dry eyes).    . Probiotic Product (PROBIOTIC DAILY PO) Take 1 capsule by mouth daily.     . sildenafil (REVATIO) 20 MG tablet Take 1-5 tablets (20-100 mg total) by mouth daily as needed. (Patient taking differently: Take 20-100 mg by mouth daily as needed (for ED). ) 50 tablet 2  . tamsulosin (FLOMAX) 0.4 MG CAPS capsule Take 1 capsule (0.4 mg total)  by mouth daily. 90 capsule 3  . TAURINE PO Take 150 mg by mouth daily.    . TURMERIC CURCUMIN PO Take 900 mg by mouth daily.    Marland Kitchen zinc gluconate 50 MG tablet Take 50 mg by mouth daily.     No current facility-administered medications for this visit.     Past Medical History:  Diagnosis Date  . Allergic rhinitis, cause unspecified   . Atrial fibrillation ([place])   . CHF (congestive heart failure) ([place])   . COPD (chronic obstructive pulmonary disease) ([place])    pt denies  . Diverticulosis of colon (without mention of hemorrhage)   . Essential hypertension, benign    pt denies  . Hyperlipidemia   . Impotence of organic origin   . Mild anxiety   . Skin cancer   . Unspecified venous (peripheral) insufficiency     Past Surgical History:  Procedure Laterality Date  . ATRIAL FIBRILLATION ABLATION N/A 03/03/2017   Procedure: Atrial Fibrillation Ablation;  Surgeon: [name], MD;  Location: [facility];   Service: Cardiovascular;  Laterality: N/A;  . COLONOSCOPY WITH PROPOFOL N/A 11/20/2014   Procedure: COLONOSCOPY WITH PROPOFOL;  Surgeon: Manya Silvas, MD;  Location: [facility] ENDOSCOPY;  Service: Endoscopy;  Laterality: N/A;  . rib cartilage     benign tumor  . SKIN CANCER EXCISION Right 06/2010   lower leg  . TONSILLECTOMY AND ADENOIDECTOMY    . VEIN LIGATION AND STRIPPING      Social History Social History   Tobacco Use  . Smoking status: Current Every Day Smoker    Packs/day: 1.00    Years: 35.00    Pack years: 35.00    Types: Cigarettes, Cigars  . Smokeless tobacco: Never Used  . Tobacco comment: patient uses natural tobacco (rolls his own)  Substance Use Topics  . Alcohol use: Yes    Alcohol/week: 1.0 standard drinks    Types: 1 Glasses of wine per week    Comment: 2-3 glasses of wine at night  . Drug use: No      Family History Family History  Problem Relation Age of Onset  . Suicidality Mother   . Leukemia Father   . Hypercholesterolemia Father   . Colon cancer Maternal Grandmother   . Hypertension Brother   . Hyperlipidemia Brother   . Cirrhosis Brother   . Alcoholism Brother      No Known Allergies   REVIEW OF SYSTEMS (Negative unless checked)  Constitutional: [] Weight loss  [] Fever  [] Chills Cardiac: [] Chest pain   [] Chest pressure   [x] Palpitations   [] Shortness of breath when laying flat   [] Shortness of breath at rest   [] Shortness of breath with exertion. Vascular:  [] Pain in legs with walking   [] Pain in legs at rest   [] Pain in legs when laying flat   [] Claudication   [] Pain in feet when walking  [] Pain in feet at rest  [] Pain in feet when laying flat   [] History of DVT   [x] Phlebitis   [x] Swelling in legs   [x] Varicose veins   [] Non-healing ulcers Pulmonary:   [] Uses home oxygen   [] Productive cough   [] Hemoptysis   [] Wheeze  [] COPD   [] Asthma Neurologic:  [] Dizziness  [] Blackouts   [] Seizures   [] History of stroke   [] History of TIA  [] Aphasia    [] Temporary blindness   [] Dysphagia   [] Weakness or numbness in arms   [] Weakness or numbness in legs Musculoskeletal:  [] Arthritis   [] Joint swelling   []   Joint pain   [] Low back pain Hematologic:  [] Easy bruising  [] Easy bleeding   [] Hypercoagulable state   [] Anemic   Gastrointestinal:  [] Blood in stool   [] Vomiting blood  [] Gastroesophageal reflux/heartburn   [] Abdominal pain Genitourinary:  [] Chronic kidney disease   [] Difficult urination  [] Frequent urination  [] Burning with urination   [] Hematuria Skin:  [] Rashes   [] Ulcers   [] Wounds Psychological:  [x] History of anxiety   []  History of major depression.  Physical Examination  BP 126/70 (BP Location: Right Arm)   Pulse 65   Resp 16   Ht 5\' 11"  (1.803 m)   Wt 172 lb 12.8 oz (78.4 kg)   BMI 24.10 kg/m  Gen:  WD/WN, NAD Head: [place]/AT, No temporalis wasting. Ear/Nose/Throat: Hearing grossly intact, nares w/o erythema or drainage Eyes: Conjunctiva clear. Sclera non-icteric Neck: Supple.  Trachea midline Pulmonary:  Good air movement, no use of accessory muscles.  Cardiac: RRR, no JVD Vascular: Very firm, somewhat indurated area overlying the thrombosed varicosities on the right posterior thigh.  No erythema at this point.  Not really tender to palpation.  Diffuse varicosities are present bilaterally Vessel Right Left  Radial Palpable Palpable                       Musculoskeletal: M/S 5/5 throughout.  No deformity or atrophy.  No significant lower extremity edema. Neurologic: Sensation grossly intact in extremities.  Symmetrical.  Speech is fluent.  Psychiatric: Judgment intact, Mood & affect appropriate for pt's clinical situation. Dermatologic: No rashes or ulcers noted.  No cellulitis or open wounds.       Labs No results found for this or any previous visit (from the past 2160 hour(s)).  Radiology No results found.  Assessment/Plan  Chronic venous insufficiency The patient does have varicosities that would  benefit from sclerotherapy going forward.  I would wait until after this thrombophlebitis resolves.  Superficial thrombophlebitis The patient has a prominent superficial thrombophlebitis on his posterior thigh.  This is gradually improving.  Continue compresses and heat as tolerated.  Aspirin therapy daily of benefit.  Return in about 2 months for follow-up    Leotis Pain, MD  02/23/2018 1:07 PM    This note was created with Dragon medical transcription system.  Any errors from dictation are purely unintentional

## 2018-02-26 ENCOUNTER — Ambulatory Visit (INDEPENDENT_AMBULATORY_CARE_PROVIDER_SITE_OTHER): Payer: Medicare HMO | Admitting: Vascular Surgery

## 2018-03-10 DIAGNOSIS — R69 Illness, unspecified: Secondary | ICD-10-CM | POA: Diagnosis not present

## 2018-03-10 DIAGNOSIS — M9902 Segmental and somatic dysfunction of thoracic region: Secondary | ICD-10-CM | POA: Diagnosis not present

## 2018-03-10 DIAGNOSIS — M531 Cervicobrachial syndrome: Secondary | ICD-10-CM | POA: Diagnosis not present

## 2018-03-10 DIAGNOSIS — M9901 Segmental and somatic dysfunction of cervical region: Secondary | ICD-10-CM | POA: Diagnosis not present

## 2018-03-10 DIAGNOSIS — M5136 Other intervertebral disc degeneration, lumbar region: Secondary | ICD-10-CM | POA: Diagnosis not present

## 2018-03-10 DIAGNOSIS — M9903 Segmental and somatic dysfunction of lumbar region: Secondary | ICD-10-CM | POA: Diagnosis not present

## 2018-03-10 DIAGNOSIS — M546 Pain in thoracic spine: Secondary | ICD-10-CM | POA: Diagnosis not present

## 2018-03-15 ENCOUNTER — Other Ambulatory Visit (HOSPITAL_COMMUNITY): Payer: Self-pay | Admitting: Urology

## 2018-03-15 DIAGNOSIS — R972 Elevated prostate specific antigen [PSA]: Secondary | ICD-10-CM

## 2018-03-23 ENCOUNTER — Ambulatory Visit (HOSPITAL_COMMUNITY): Payer: Medicare HMO

## 2018-03-31 ENCOUNTER — Encounter (HOSPITAL_COMMUNITY): Payer: Self-pay

## 2018-03-31 ENCOUNTER — Ambulatory Visit (HOSPITAL_COMMUNITY)
Admission: RE | Admit: 2018-03-31 | Discharge: 2018-03-31 | Disposition: A | Discharge status: Home / Self Care | Payer: Medicare HMO | Source: Ambulatory Visit | Attending: Urology | Admitting: Urology

## 2018-03-31 DIAGNOSIS — R972 Elevated prostate specific antigen [PSA]: Secondary | ICD-10-CM | POA: Insufficient documentation

## 2018-03-31 NOTE — Progress Notes (Signed)
Patient arrived for MRI Endorectal Prostate and was very upset that no one in his MD office had explained the procedure to him. He stated he would not have this done. I called Dr Donalda Ewings office and let them be aware patient refused exam Bhj\

## 2018-04-05 DIAGNOSIS — M9903 Segmental and somatic dysfunction of lumbar region: Secondary | ICD-10-CM | POA: Diagnosis not present

## 2018-04-05 DIAGNOSIS — M531 Cervicobrachial syndrome: Secondary | ICD-10-CM | POA: Diagnosis not present

## 2018-04-05 DIAGNOSIS — M9902 Segmental and somatic dysfunction of thoracic region: Secondary | ICD-10-CM | POA: Diagnosis not present

## 2018-04-05 DIAGNOSIS — M546 Pain in thoracic spine: Secondary | ICD-10-CM | POA: Diagnosis not present

## 2018-04-05 DIAGNOSIS — M9901 Segmental and somatic dysfunction of cervical region: Secondary | ICD-10-CM | POA: Diagnosis not present

## 2018-04-05 DIAGNOSIS — M5136 Other intervertebral disc degeneration, lumbar region: Secondary | ICD-10-CM | POA: Diagnosis not present

## 2018-04-06 DIAGNOSIS — N400 Enlarged prostate without lower urinary tract symptoms: Secondary | ICD-10-CM | POA: Diagnosis not present

## 2018-04-06 DIAGNOSIS — R972 Elevated prostate specific antigen [PSA]: Secondary | ICD-10-CM | POA: Diagnosis not present

## 2018-04-23 ENCOUNTER — Encounter (INDEPENDENT_AMBULATORY_CARE_PROVIDER_SITE_OTHER): Payer: Self-pay | Admitting: Vascular Surgery

## 2018-04-23 ENCOUNTER — Ambulatory Visit (INDEPENDENT_AMBULATORY_CARE_PROVIDER_SITE_OTHER): Payer: Medicare HMO | Admitting: Vascular Surgery

## 2018-04-23 VITALS — BP 102/59 | HR 61 | Ht 70.0 in | Wt 175.0 lb

## 2018-04-23 DIAGNOSIS — R69 Illness, unspecified: Secondary | ICD-10-CM | POA: Diagnosis not present

## 2018-04-23 DIAGNOSIS — I10 Essential (primary) hypertension: Secondary | ICD-10-CM

## 2018-04-23 DIAGNOSIS — I8001 Phlebitis and thrombophlebitis of superficial vessels of right lower extremity: Secondary | ICD-10-CM | POA: Diagnosis not present

## 2018-04-23 DIAGNOSIS — I872 Venous insufficiency (chronic) (peripheral): Secondary | ICD-10-CM

## 2018-04-23 DIAGNOSIS — F17210 Nicotine dependence, cigarettes, uncomplicated: Secondary | ICD-10-CM

## 2018-04-23 NOTE — Progress Notes (Signed)
MRN : [id number]  Steven Griffin is a 72 y.o. (1945-10-13) male who presents with chief complaint of  Chief Complaint  Patient presents with  . Follow-up  .  History of Present Illness: Patient returns today in follow up of his right leg superficial thrombophlebitis.  Although this has not resolved, it is much better than it was at his last visit.  He denies any chest pain or shortness of breath.  No fevers or chills.  He has been wrapping his leg and this has certainly helped.  Current Outpatient Medications  Medication Sig Dispense Refill  . 5-Hydroxytryptophan (5-HTP) 100 MG CAPS Take 100 mg by mouth at bedtime.     Marland Kitchen b complex vitamins capsule Take 1 capsule by mouth daily.    . Cholecalciferol (VITAMIN D3 PO) Take 1 capsule by mouth daily.    . Coenzyme Q10 (CO Q 10) 100 MG CAPS Take 100 mg by mouth daily.    . Digestive Enzymes (DIGESTIVE ENZYME PO) Take 1 capsule by mouth 3 (three) times daily.     . diphenhydrAMINE (BENADRYL) 25 MG tablet Take 25 mg by mouth at bedtime as needed.    . finasteride (PROSCAR) 5 MG tablet Take 1 tablet (5 mg total) by mouth daily. 90 tablet 3  . Flaxseed, Linseed, (FLAXSEED OIL PO) Take 1 each by mouth See admin instructions. Take 15 ml by mouth daily    . Homeopathic Products (LEG CRAMP RELIEF PO) Take 2 tablets by mouth at bedtime.    Marland Kitchen L-Theanine 100 MG CAPS Take 100 mg by mouth 2 (two) times daily.    Marland Kitchen MAGNESIUM PO Take 300 mg by mouth at bedtime.     . Melatonin 3 MG CAPS Take 3 mg by mouth at bedtime.     . Menaquinone-7 (VITAMIN K2 PO) Take 1 tablet by mouth daily.    . Misc Natural Products (BETA-SITOSTEROL PLANT STEROLS) CAPS Take 1 capsule by mouth daily.    . Misc Natural Products (GLUCOSAMINE CHOND MSM FORMULA PO) Take 2 capsules by mouth 2 (two) times daily.    . Multiple Vitamin (MULTIVITAMIN) tablet Take 1 tablet by mouth daily.    . NON FORMULARY Take 1 tablet by mouth daily. AMPK Supplement    . NON FORMULARY Take 1,000 mg by  mouth 2 (two) times daily after a meal. CLA 1000 mg Supplement    . Omega-3 Fatty Acids (FISH OIL) 1000 MG CAPS Take 1,000 mg by mouth daily.     Marland Kitchen OVER THE COUNTER MEDICATION Take 1 capsule by mouth daily. Bacopa Supplement    . OVER THE COUNTER MEDICATION Take 2 tablets by mouth daily. Carb Shredder Supplement    . Polyvinyl Alcohol-Povidone (REFRESH OP) Place 2 drops into both eyes as needed (for dry eyes).    . Probiotic Product (PROBIOTIC DAILY PO) Take 1 capsule by mouth daily.     . sildenafil (REVATIO) 20 MG tablet Take 1-5 tablets (20-100 mg total) by mouth daily as needed. (Patient taking differently: Take 20-100 mg by mouth daily as needed (for ED). ) 50 tablet 2  . tamsulosin (FLOMAX) 0.4 MG CAPS capsule Take 1 capsule (0.4 mg total) by mouth daily. 90 capsule 3  . TAURINE PO Take 150 mg by mouth daily.    . TURMERIC CURCUMIN PO Take 900 mg by mouth daily.    Marland Kitchen zinc gluconate 50 MG tablet Take 50 mg by mouth daily.    Marland Kitchen diltiazem (CARDIZEM) 30 MG  tablet Take 30 mg by mouth 4 (four) times daily as needed (for heart).     . pantoprazole (PROTONIX) 40 MG tablet TAKE (1) TABLET BY MOUTH EVERY DAY (Patient not taking: Reported on 04/23/2018) 30 tablet 9   No current facility-administered medications for this visit.     Past Medical History:  Diagnosis Date  . Allergic rhinitis, cause unspecified   . Atrial fibrillation ([place])   . CHF (congestive heart failure) ([place])   . COPD (chronic obstructive pulmonary disease) ([place])    pt denies  . Diverticulosis of colon (without mention of hemorrhage)   . Essential hypertension, benign    pt denies  . Hyperlipidemia   . Impotence of organic origin   . Mild anxiety   . Skin cancer   . Unspecified venous (peripheral) insufficiency     Past Surgical History:  Procedure Laterality Date  . ATRIAL FIBRILLATION ABLATION N/A 03/03/2017   Procedure: Atrial Fibrillation Ablation;  Surgeon: [name], MD;  Location: [facility];   Service: Cardiovascular;  Laterality: N/A;  . COLONOSCOPY WITH PROPOFOL N/A 11/20/2014   Procedure: COLONOSCOPY WITH PROPOFOL;  Surgeon: Manya Silvas, MD;  Location: [facility];  Service: Endoscopy;  Laterality: N/A;  . rib cartilage     benign tumor  . SKIN CANCER EXCISION Right 06/2010   lower leg  . TONSILLECTOMY AND ADENOIDECTOMY    . VEIN LIGATION AND STRIPPING      Social History        Tobacco Use  . Smoking status: Current Every Day Smoker    Packs/day: 1.00    Years: 35.00    Pack years: 35.00    Types: Cigarettes, Cigars  . Smokeless tobacco: Never Used  . Tobacco comment: patient uses natural tobacco (rolls his own)  Substance Use Topics  . Alcohol use: Yes    Alcohol/week: 1.0 standard drinks    Types: 1 Glasses of wine per week    Comment: 2-3 glasses of wine at night  . Drug use: No      Family History      Family History  Problem Relation Age of Onset  . Suicidality Mother   . Leukemia Father   . Hypercholesterolemia Father   . Colon cancer Maternal Grandmother   . Hypertension Brother   . Hyperlipidemia Brother   . Cirrhosis Brother   . Alcoholism Brother      No Known Allergies   REVIEW OF SYSTEMS (Negative unless checked)  Constitutional: [] ?Weight loss  [] ?Fever  [] ?Chills Cardiac: [] ?Chest pain   [] ?Chest pressure   [x] ?Palpitations   [] ?Shortness of breath when laying flat   [] ?Shortness of breath at rest   [] ?Shortness of breath with exertion. Vascular:  [] ?Pain in legs with walking   [] ?Pain in legs at rest   [] ?Pain in legs when laying flat   [] ?Claudication   [] ?Pain in feet when walking  [] ?Pain in feet at rest  [] ?Pain in feet when laying flat   [] ?History of DVT   [x] ?Phlebitis   [x] ?Swelling in legs   [x] ?Varicose veins   [] ?Non-healing ulcers Pulmonary:   [] ?Uses home oxygen   [] ?Productive cough   [] ?Hemoptysis   [] ?Wheeze  [] ?COPD   [] ?Asthma Neurologic:  [] ?Dizziness  [] ?Blackouts    [] ?Seizures   [] ?History of stroke   [] ?History of TIA  [] ?Aphasia   [] ?Temporary blindness   [] ?Dysphagia   [] ?Weakness or numbness in arms   [] ?Weakness or numbness in legs Musculoskeletal:  [] ?Arthritis   [] ?  Joint swelling   [] ?Joint pain   [] ?Low back pain Hematologic:  [] ?Easy bruising  [] ?Easy bleeding   [] ?Hypercoagulable state   [] ?Anemic   Gastrointestinal:  [] ?Blood in stool   [] ?Vomiting blood  [] ?Gastroesophageal reflux/heartburn   [] ?Abdominal pain Genitourinary:  [] ?Chronic kidney disease   [] ?Difficult urination  [] ?Frequent urination  [] ?Burning with urination   [] ?Hematuria Skin:  [] ?Rashes   [] ?Ulcers   [] ?Wounds Psychological:  [x] ?History of anxiety   [] ? History of major depression.    Physical Examination  BP (!) 102/59 (BP Location: Left Arm)   Pulse 61   Ht 5\' 10"  (1.778 m)   Wt 175 lb (79.4 kg)   BMI 25.11 kg/m  Gen:  WD/WN, NAD.  Appears younger than stated age Head: [place]/AT, No temporalis wasting. Ear/Nose/Throat: Hearing grossly intact, nares w/o erythema or drainage Eyes: Conjunctiva clear. Sclera non-icteric Neck: Supple.  Trachea midline Pulmonary:  Good air movement, no use of accessory muscles.  Cardiac: RRR, no JVD Vascular: The area of palpable superficial thrombophlebitis in the right posterior calf has decreased in size but not resolved.  There is still some mild darkening and bruising in the area, but no obvious large varicosities that remain patent appear present. Vessel Right Left  Radial Palpable Palpable                          PT Palpable Palpable  DP Palpable Palpable   Gastrointestinal: soft, non-tender/non-distended. No guarding/reflex.  Musculoskeletal: M/S 5/5 throughout.  No deformity or atrophy. No edema. Neurologic: Sensation grossly intact in extremities.  Symmetrical.  Speech is fluent.  Psychiatric: Judgment intact, Mood & affect appropriate for pt's clinical situation. Dermatologic: No rashes or ulcers noted.  No  cellulitis or open wounds.       Labs No results found for this or any previous visit (from the past 2160 hour(s)).  Radiology No results found.  Assessment/Plan  Chronic venous insufficiency Has had previous vein stripping and his superficial thrombophlebitis is largely result of his chronic venous insufficiency.  Symptoms improved with conservative therapy without obvious large varicosities remaining to be treated.  Continue conservative therapy for now.  Superficial thrombophlebitis Symptoms gradually improving.  Continue compression of the area and anti-inflammatories as needed.  At this point, we will see the patient back as needed if the area worsens.    Leotis Pain, MD  04/23/2018 10:53 AM    This note was created with Dragon medical transcription system.  Any errors from dictation are purely unintentional

## 2018-04-23 NOTE — Assessment & Plan Note (Signed)
Has had previous vein stripping and his superficial thrombophlebitis is largely result of his chronic venous insufficiency.  Symptoms improved with conservative therapy without obvious large varicosities remaining to be treated.  Continue conservative therapy for now.

## 2018-04-23 NOTE — Patient Instructions (Signed)

## 2018-04-23 NOTE — Assessment & Plan Note (Signed)
Symptoms gradually improving.  Continue compression of the area and anti-inflammatories as needed.  At this point, we will see the patient back as needed if the area worsens.

## 2018-05-01 LAB — PSA: PSA: 2.3

## 2018-05-03 DIAGNOSIS — L603 Nail dystrophy: Secondary | ICD-10-CM | POA: Diagnosis not present

## 2018-05-03 DIAGNOSIS — L720 Epidermal cyst: Secondary | ICD-10-CM | POA: Diagnosis not present

## 2018-05-14 ENCOUNTER — Ambulatory Visit (INDEPENDENT_AMBULATORY_CARE_PROVIDER_SITE_OTHER): Payer: Medicare HMO

## 2018-05-14 ENCOUNTER — Encounter: Payer: Self-pay | Admitting: Family Medicine

## 2018-05-14 ENCOUNTER — Ambulatory Visit (INDEPENDENT_AMBULATORY_CARE_PROVIDER_SITE_OTHER): Payer: Medicare HMO | Admitting: Family Medicine

## 2018-05-14 VITALS — BP 112/64 | HR 52 | Temp 97.6°F | Resp 16 | Ht 70.0 in | Wt 175.5 lb

## 2018-05-14 DIAGNOSIS — N401 Enlarged prostate with lower urinary tract symptoms: Secondary | ICD-10-CM | POA: Diagnosis not present

## 2018-05-14 DIAGNOSIS — Z0000 Encounter for general adult medical examination without abnormal findings: Secondary | ICD-10-CM

## 2018-05-14 DIAGNOSIS — N529 Male erectile dysfunction, unspecified: Secondary | ICD-10-CM | POA: Diagnosis not present

## 2018-05-14 DIAGNOSIS — I480 Paroxysmal atrial fibrillation: Secondary | ICD-10-CM

## 2018-05-14 DIAGNOSIS — N138 Other obstructive and reflux uropathy: Secondary | ICD-10-CM | POA: Diagnosis not present

## 2018-05-14 DIAGNOSIS — Z9889 Other specified postprocedural states: Secondary | ICD-10-CM | POA: Diagnosis not present

## 2018-05-14 DIAGNOSIS — E7800 Pure hypercholesterolemia, unspecified: Secondary | ICD-10-CM | POA: Diagnosis not present

## 2018-05-14 NOTE — Patient Instructions (Signed)
[name] , Thank you for taking time to come for your Medicare Wellness Visit. I appreciate your ongoing commitment to your health goals. Please review the following plan we discussed and let me know if I can assist you in the future.   Screening recommendations/referrals: Colonoscopy: done 11/20/14 repeat in 2026 Recommended yearly ophthalmology/optometry visit for glaucoma screening and checkup Recommended yearly dental visit for hygiene and checkup  Vaccinations: Influenza vaccine: done 03/10/18 Pneumococcal vaccine: done 08/28/14 Tdap vaccine: done 06/17/11 Shingles vaccine: Shingrix discussed. Please contact your pharmacy for coverage information.   Advanced directives: Please bring a copy of your health care power of attorney and living will to the office at your convenience.  Conditions/risks identified: Keep up the great work!  Next appointment: Please follow up in one year for your Medicare Annual Wellness visit.    Preventive Care 72 Years and Older, Male Preventive care refers to lifestyle choices and visits with your health care provider that can promote health and wellness. What does preventive care include?  A yearly physical exam. This is also called an annual well check.  Dental exams once or twice a year.  Routine eye exams. Ask your health care provider how often you should have your eyes checked.  Personal lifestyle choices, including:  Daily care of your teeth and gums.  Regular physical activity.  Eating a healthy diet.  Avoiding tobacco and drug use.  Limiting alcohol use.  Practicing safe sex.  Taking low doses of aspirin every day.  Taking vitamin and mineral supplements as recommended by your health care provider. What happens during an annual well check? The services and screenings done by your health care provider during your annual well check will depend on your age, overall health, lifestyle risk factors, and family history of  disease. Counseling  Your health care provider may ask you questions about your:  Alcohol use.  Tobacco use.  Drug use.  Emotional well-being.  Home and relationship well-being.  Sexual activity.  Eating habits.  History of falls.  Memory and ability to understand (cognition).  Work and work Statistician. Screening  You may have the following tests or measurements:  Height, weight, and BMI.  Blood pressure.  Lipid and cholesterol levels. These may be checked every 5 years, or more frequently if you are over 70 years old.  Skin check.  Lung cancer screening. You may have this screening every year starting at age 24 if you have a 30-pack-year history of smoking and currently smoke or have quit within the past 15 years.  Fecal occult blood test (FOBT) of the stool. You may have this test every year starting at age 49.  Flexible sigmoidoscopy or colonoscopy. You may have a sigmoidoscopy every 5 years or a colonoscopy every 10 years starting at age 31.  Prostate cancer screening. Recommendations will vary depending on your family history and other risks.  Hepatitis C blood test.  Hepatitis B blood test.  Sexually transmitted disease (STD) testing.  Diabetes screening. This is done by checking your blood sugar (glucose) after you have not eaten for a while (fasting). You may have this done every 1-3 years.  Abdominal aortic aneurysm (AAA) screening. You may need this if you are a current or former smoker.  Osteoporosis. You may be screened starting at age 58 if you are at high risk. Talk with your health care provider about your test results, treatment options, and if necessary, the need for more tests. Vaccines  Your health care provider  may recommend certain vaccines, such as:  Influenza vaccine. This is recommended every year.  Tetanus, diphtheria, and acellular pertussis (Tdap, Td) vaccine. You may need a Td booster every 10 years.  Zoster vaccine. You may  need this after age 39.  Pneumococcal 13-valent conjugate (PCV13) vaccine. One dose is recommended after age 56.  Pneumococcal polysaccharide (PPSV23) vaccine. One dose is recommended after age 45. Talk to your health care provider about which screenings and vaccines you need and how often you need them. This information is not intended to replace advice given to you by your health care provider. Make sure you discuss any questions you have with your health care provider. Document Released: 05/11/2015 Document Revised: 01/02/2016 Document Reviewed: 02/13/2015 Elsevier Interactive Patient Education  2017 [place] Prevention in the Home Falls can cause injuries. They can happen to people of all ages. There are many things you can do to make your home safe and to help prevent falls. What can I do on the outside of my home?  Regularly fix the edges of walkways and driveways and fix any cracks.  Remove anything that might make you trip as you walk through a door, such as a raised step or threshold.  Trim any bushes or trees on the path to your home.  Use bright outdoor lighting.  Clear any walking paths of anything that might make someone trip, such as rocks or tools.  Regularly check to see if handrails are loose or broken. Make sure that both sides of any steps have handrails.  Any raised decks and porches should have guardrails on the edges.  Have any leaves, snow, or ice cleared regularly.  Use sand or salt on walking paths during winter.  Clean up any spills in your garage right away. This includes oil or grease spills. What can I do in the bathroom?  Use night lights.  Install grab bars by the toilet and in the tub and shower. Do not use towel bars as grab bars.  Use non-skid mats or decals in the tub or shower.  If you need to sit down in the shower, use a plastic, non-slip stool.  Keep the floor dry. Clean up any water that spills on the floor as soon as it  happens.  Remove soap buildup in the tub or shower regularly.  Attach bath mats securely with double-sided non-slip rug tape.  Do not have throw rugs and other things on the floor that can make you trip. What can I do in the bedroom?  Use night lights.  Make sure that you have a light by your bed that is easy to reach.  Do not use any sheets or blankets that are too big for your bed. They should not hang down onto the floor.  Have a firm chair that has side arms. You can use this for support while you get dressed.  Do not have throw rugs and other things on the floor that can make you trip. What can I do in the kitchen?  Clean up any spills right away.  Avoid walking on wet floors.  Keep items that you use a lot in easy-to-reach places.  If you need to reach something above you, use a strong step stool that has a grab bar.  Keep electrical cords out of the way.  Do not use floor polish or wax that makes floors slippery. If you must use wax, use non-skid floor wax.  Do not have throw rugs  and other things on the floor that can make you trip. What can I do with my stairs?  Do not leave any items on the stairs.  Make sure that there are handrails on both sides of the stairs and use them. Fix handrails that are broken or loose. Make sure that handrails are as long as the stairways.  Check any carpeting to make sure that it is firmly attached to the stairs. Fix any carpet that is loose or worn.  Avoid having throw rugs at the top or bottom of the stairs. If you do have throw rugs, attach them to the floor with carpet tape.  Make sure that you have a light switch at the top of the stairs and the bottom of the stairs. If you do not have them, ask someone to add them for you. What else can I do to help prevent falls?  Wear shoes that:  Do not have high heels.  Have rubber bottoms.  Are comfortable and fit you well.  Are closed at the toe. Do not wear sandals.  If you  use a stepladder:  Make sure that it is fully opened. Do not climb a closed stepladder.  Make sure that both sides of the stepladder are locked into place.  Ask someone to hold it for you, if possible.  Clearly mark and make sure that you can see:  Any grab bars or handrails.  First and last steps.  Where the edge of each step is.  Use tools that help you move around (mobility aids) if they are needed. These include:  Canes.  Walkers.  Scooters.  Crutches.  Turn on the lights when you go into a dark area. Replace any light bulbs as soon as they burn out.  Set up your furniture so you have a clear path. Avoid moving your furniture around.  If any of your floors are uneven, fix them.  If there are any pets around you, be aware of where they are.  Review your medicines with your doctor. Some medicines can make you feel dizzy. This can increase your chance of falling. Ask your doctor what other things that you can do to help prevent falls. This information is not intended to replace advice given to you by your health care provider. Make sure you discuss any questions you have with your health care provider. Document Released: 02/08/2009 Document Revised: 09/20/2015 Document Reviewed: 05/19/2014 Elsevier Interactive Patient Education  2017 [employer].

## 2018-05-14 NOTE — Progress Notes (Signed)
Subjective:   Steven Griffin is a 73 y.o. male who presents for Medicare Annual/Subsequent preventive examination.  Review of Systems:   Cardiac Risk Factors include: advanced age (>79men, >20 women);male gender;smoking/ tobacco exposure     Objective:    Vitals: BP 112/64 (BP Location: Right Arm, Patient Position: Sitting, Cuff Size: Normal)   Pulse (!) 52   Temp 97.6 F (36.4 C) (Oral)   Resp 16   Ht 5\' 10"  (1.778 m)   Wt 175 lb 8 oz (79.6 kg)   SpO2 97%   BMI 25.18 kg/m   Body mass index is 25.18 kg/m.  Advanced Directives 05/14/2018 03/03/2017 09/02/2016 08/05/2016 05/01/2016 01/18/2016 08/31/2015  Does Patient Have a Medical Advance Directive? Yes No Yes Yes Yes No;Yes Yes  Type of Paramedic of [place];Living will - [place];Living will [place];Living will [place];Living will [place];Living will [place]  Does patient want to make changes to medical advance directive? - - - - - No - Patient declined No - Patient declined  Copy of [place] in Chart? No - copy requested - No - copy requested No - copy requested - No - copy requested No - copy requested  Would patient like information on creating a medical advance directive? - No - Patient declined - - - - -    Tobacco Social History   Tobacco Use  Smoking Status Current Every Day Smoker  . Packs/day: 1.00  . Years: 35.00  . Pack years: 35.00  . Types: Cigarettes, Cigars  Smokeless Tobacco Never Used  Tobacco Comment   patient uses natural tobacco (rolls his own)     Ready to quit: Not Answered Counseling given: Not Answered Comment: patient uses natural tobacco (rolls his own)   Clinical Intake:  Pre-visit preparation completed: Yes  Pain : 0-10 Pain Score: 7  Pain Type: Acute pain Pain Location: Elbow Pain Orientation: Right Pain Descriptors / Indicators:  Aching Pain Onset: More than a month ago Pain Frequency: Constant     Nutritional Status: BMI 25 -29 Overweight Nutritional Risks: None Diabetes: No  How often do you need to have someone help you when you read instructions, pamphlets, or other written materials from your doctor or pharmacy?: 1 - Never What is the last grade level you completed in school?: 2 years college  Interpreter Needed?: No  Information entered by :: Clemetine Marker LPN  Past Medical History:  Diagnosis Date  . Allergic rhinitis, cause unspecified   . Atrial fibrillation ([place])   . CHF (congestive heart failure) ([place])   . COPD (chronic obstructive pulmonary disease) ([place])    pt denies  . Diverticulosis of colon (without mention of hemorrhage)   . Essential hypertension, benign    pt denies  . Hyperlipidemia   . Impotence of organic origin   . Mild anxiety   . Skin cancer   . Unspecified venous (peripheral) insufficiency    Past Surgical History:  Procedure Laterality Date  . ATRIAL FIBRILLATION ABLATION N/A 03/03/2017   Procedure: Atrial Fibrillation Ablation;  Surgeon: [name], MD;  Location: [facility];  Service: Cardiovascular;  Laterality: N/A;  . COLONOSCOPY WITH PROPOFOL N/A 11/20/2014   Procedure: COLONOSCOPY WITH PROPOFOL;  Surgeon: Manya Silvas, MD;  Location: [facility];  Service: Endoscopy;  Laterality: N/A;  . rib cartilage     benign tumor  . SKIN CANCER EXCISION  Right 06/2010   lower leg  . TONSILLECTOMY AND ADENOIDECTOMY    . VEIN LIGATION AND STRIPPING     Family History  Problem Relation Age of Onset  . Suicidality Mother   . Leukemia Father   . Hypercholesterolemia Father   . Hypertension Brother   . Hyperlipidemia Brother   . Cirrhosis Brother   . Colon cancer Maternal Grandmother    Social History   Socioeconomic History  . Marital status: Married    Spouse name: Neoma Laming  . Number of children: 3  . Years of education: 70  . Highest education level:  Not on file  Occupational History  . Occupation: Product manager  Social Needs  . Financial resource strain: Not very hard  . Food insecurity:    Worry: Never true    Inability: Never true  . Transportation needs:    Medical: No    Non-medical: No  Tobacco Use  . Smoking status: Current Every Day Smoker    Packs/day: 1.00    Years: 35.00    Pack years: 35.00    Types: Cigarettes, Cigars  . Smokeless tobacco: Never Used  . Tobacco comment: patient uses natural tobacco (rolls his own)  Substance and Sexual Activity  . Alcohol use: Yes    Alcohol/week: 1.0 standard drinks    Types: 1 Glasses of wine per week    Comment: 2-3 glasses of wine at night  . Drug use: No  . Sexual activity: Yes    Partners: Female    Birth control/protection: None  Lifestyle  . Physical activity:    Days per week: 6 days    Minutes per session: 30 min  . Stress: Not at all  Relationships  . Social connections:    Talks on phone: More than three times a week    Gets together: Twice a week    Attends religious service: More than 4 times per year    Active member of club or organization: Yes    Attends meetings of clubs or organizations: 1 to 4 times per year    Relationship status: Married  Other Topics Concern  . Not on file  Social History Narrative   Pt lives in [place] with spouse.  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    Outpatient Encounter Medications as of 05/14/2018  Medication Sig  . 5-Hydroxytryptophan (5-HTP) 100 MG CAPS Take 100 mg by mouth at bedtime.   Marland Kitchen b complex vitamins capsule Take 1 capsule by mouth daily.  . Cholecalciferol (VITAMIN D3 PO) Take 1 capsule by mouth daily.  . Coenzyme Q10 (CO Q 10) 100 MG CAPS Take 100 mg by mouth daily.  . Digestive Enzymes (DIGESTIVE ENZYME PO) Take 1 capsule by mouth 3 (three) times daily.   Marland Kitchen diltiazem (CARDIZEM) 30 MG tablet Take 30 mg by mouth 4 (four) times daily as needed (for heart).   .  diphenhydrAMINE (BENADRYL) 25 MG tablet Take 25 mg by mouth at bedtime as needed.  . finasteride (PROSCAR) 5 MG tablet Take 1 tablet (5 mg total) by mouth daily.  . Flaxseed, Linseed, (FLAXSEED OIL PO) Take 1 each by mouth See admin instructions. Take 15 ml by mouth daily  . Homeopathic Products (LEG CRAMP RELIEF PO) Take 2 tablets by mouth at bedtime.  Marland Kitchen L-Theanine 100 MG CAPS Take 100 mg by mouth 2 (two) times daily.  Marland Kitchen MAGNESIUM PO Take 300 mg by mouth at bedtime.   . Melatonin 3  MG CAPS Take 3 mg by mouth at bedtime.   . Menaquinone-7 (VITAMIN K2 PO) Take 1 tablet by mouth daily.  . Misc Natural Products (BETA-SITOSTEROL PLANT STEROLS) CAPS Take 1 capsule by mouth daily.  . Misc Natural Products (GLUCOSAMINE CHOND MSM FORMULA PO) Take 2 capsules by mouth 2 (two) times daily.  . Multiple Vitamin (MULTIVITAMIN) tablet Take 1 tablet by mouth daily.  . NON FORMULARY Take 1 tablet by mouth daily. AMPK Supplement  . NON FORMULARY Take 1,000 mg by mouth 2 (two) times daily after a meal. CLA 1000 mg Supplement  . Omega-3 Fatty Acids (FISH OIL) 1000 MG CAPS Take 1,000 mg by mouth daily.   Marland Kitchen OVER THE COUNTER MEDICATION Take 1 capsule by mouth daily. Bacopa Supplement  . OVER THE COUNTER MEDICATION Take 2 tablets by mouth daily. Carb Shredder Supplement  . Polyvinyl Alcohol-Povidone (REFRESH OP) Place 2 drops into both eyes as needed (for dry eyes).  . Probiotic Product (PROBIOTIC DAILY PO) Take 1 capsule by mouth daily.   . sildenafil (REVATIO) 20 MG tablet Take 1-5 tablets (20-100 mg total) by mouth daily as needed. (Patient taking differently: Take 20-100 mg by mouth daily as needed (for ED). )  . tamsulosin (FLOMAX) 0.4 MG CAPS capsule Take 1 capsule (0.4 mg total) by mouth daily.  Marland Kitchen TAURINE PO Take 150 mg by mouth daily.  . TURMERIC CURCUMIN PO Take 900 mg by mouth daily.  Marland Kitchen zinc gluconate 50 MG tablet Take 50 mg by mouth daily.  . [DISCONTINUED] pantoprazole (PROTONIX) 40 MG tablet TAKE (1)  TABLET BY MOUTH EVERY DAY (Patient not taking: Reported on 04/23/2018)   No facility-administered encounter medications on file as of 05/14/2018.     Activities of Daily Living In your present state of health, do you have any difficulty performing the following activities: 05/14/2018 11/02/2017  Hearing? N N  Comment declines hearing aids -  Vision? N N  Comment reading glasses -  Difficulty concentrating or making decisions? N N  Walking or climbing stairs? N N  Dressing or bathing? N N  Doing errands, shopping? N N  Preparing Food and eating ? N -  Using the Toilet? N -  In the past six months, have you accidently leaked urine? N -  Do you have problems with loss of bowel control? N -  Managing your Medications? N -  Managing your Finances? N -  Housekeeping or managing your Housekeeping? N -  Some recent data might be hidden    Patient Care Team: Steele Sizer, MD as PCP - General (Family Medicine)   Assessment:   This is a routine wellness examination for Steven Griffin.  Exercise Activities and Dietary recommendations Current Exercise Habits: Structured exercise class, Type of exercise: Other - see comments;strength training/weights(cardio), Time (Minutes): 60, Frequency (Times/Week): 6, Weekly Exercise (Minutes/Week): 360, Intensity: Moderate, Exercise limited by: None identified  Goals    . Patient Stated     Maintain active and healthy lifestyle       Fall Risk Fall Risk  05/14/2018 11/02/2017 09/02/2016 09/02/2016 08/05/2016  Falls in the past year? 0 Yes Yes No No  Comment - - tripped on a suit case - -  Number falls in past yr: 0 1 - - -  Injury with Fall? - Yes Yes - -  Follow up Falls prevention discussed - - - -   FALL RISK PREVENTION PERTAINING TO THE HOME:  Any stairs in or around the home WITH handrails? Yes  Home free of loose throw rugs in walkways, pet beds, electrical cords, etc? Yes  Adequate lighting in your home to reduce risk of falls? Yes   ASSISTIVE  DEVICES UTILIZED TO PREVENT FALLS:  Life alert? No  Use of a cane, walker or w/c? No  Grab bars in the bathroom? No  Shower chair or bench in shower? No  Elevated toilet seat or a handicapped toilet? No   DME ORDERS:  DME order needed?  No   TIMED UP AND GO:  Was the test performed? Yes .  Length of time to ambulate 10 feet: 5 sec.   GAIT:  Appearance of gait: Gait stead-fast and without the use of an assistive device. Education: Fall risk prevention has been discussed.  Intervention(s) required? No   Depression Screen PHQ 2/9 Scores 05/14/2018 11/02/2017 09/02/2016 08/05/2016  PHQ - 2 Score 0 0 0 0    Cognitive Function     6CIT Screen 05/14/2018  What Year? 0 points  What month? 0 points  What time? 0 points  Count back from 20 0 points  Months in reverse 0 points  Repeat phrase 0 points  Total Score 0    Immunization History  Administered Date(s) Administered  . Influenza Split 03/22/2008  . Influenza, High Dose Seasonal PF 03/10/2018  . Influenza, Seasonal, Injecte, Preservative Fre 02/27/2012, 07/12/2012  . Influenza-Unspecified 03/23/2015, 04/01/2016, 02/16/2017  . Pneumococcal Conjugate-13 08/28/2014  . Pneumococcal Polysaccharide-23 08/03/2012  . Td 06/17/2011  . Zoster 03/28/2010    Qualifies for Shingles Vaccine? Yes  Zostavax completed 2011. Due for Shingrix. Education has been provided regarding the importance of this vaccine. Pt has been advised to call insurance company to determine out of pocket expense. Advised may also receive vaccine at local pharmacy or Health Dept. Verbalized acceptance and understanding.  Tdap: Up to date  Flu Vaccine: Up to date  Pneumococcal Vaccine: Up to date    Screening Tests Health Maintenance  Topic Date Due  . TETANUS/TDAP  06/16/2021  . COLONOSCOPY  11/19/2024  . INFLUENZA VACCINE  Completed  . Hepatitis C Screening  Completed  . PNA vac Low Risk Adult  Completed   Cancer Screenings:  Colorectal  Screening: Completed 11/20/14. Repeat every 10 years;  Lung Cancer Screening: (Low Dose CT Chest recommended if Age 83-80 years, 30 pack-year currently smoking OR have quit w/in 15years.) does not qualify.   Lung Cancer Screening Referral: An Epic message has been sent to Burgess Estelle, RN (Oncology Nurse Navigator) regarding the possible need for this exam. Raquel Sarna will review the patient's chart to determine if the patient truly qualifies for the exam. If the patient qualifies, Raquel Sarna will order the Low Dose CT of the chest to facilitate the scheduling of this exam.  Additional Screening:  Hepatitis C Screening: does qualify; Completed 08/31/15  Vision Screening: Recommended annual ophthalmology exams for early detection of glaucoma and other disorders of the eye. Is the patient up to date with their annual eye exam?  Yes  Who is the provider or what is the name of the office in which the pt attends annual eye exams? Dr. Mallie Mussel  Dental Screening: Recommended annual dental exams for proper oral hygiene  Community Resource Referral:  CRR required this visit?  No       Plan:    I have personally reviewed and addressed the Medicare Annual Wellness questionnaire and have noted the following in the patient's chart:  A. Medical and social history B. Use of alcohol,  tobacco or illicit drugs  C. Current medications and supplements D. Functional ability and status E.  Nutritional status F.  Physical activity G. Advance directives H. List of other physicians I.  Hospitalizations, surgeries, and ER visits in previous 12 months J.  [place] such as hearing and vision if needed, cognitive and depression L. Referrals and appointments   In addition, I have reviewed and discussed with patient certain preventive protocols, quality metrics, and best practice recommendations. A written personalized care plan for preventive services as well as general preventive health recommendations were  provided to patient.   Signed,  Clemetine Marker, LPN Nurse Health Advisor   Nurse Notes: none. Pt doing well overall and appreciative of visit today.

## 2018-05-14 NOTE — Progress Notes (Signed)
Name: Steven Griffin   MRN: [id number]    DOB: 07/12/1945   Date:05/14/2018       Progress Note  Subjective  Chief Complaint  Chief Complaint  Patient presents with  . Medication Refill    6 month F/U    HPI  Hyperlipidemia: he does not want to take statin therapy , last LDL was above 100  BPH: he used to see Dr. Rogers Blocker however since 2019 he switched provider and is now going to [facility] in [place], he brought a copy of his last PSA that was 2.3. He is on Finasteride and Tamsulosin. He also takes generic viagra prn.  Denies side effects of medication  Afib with recent AV node ablation: Nov 2018, doing well , denies chest pain, palpitation,  or decrease in exercise tolerance Off medication. Dr. Rayann Heman calculated his [facility] and did not recommend anti-coagulation   Chronic venous insufficiency: seeing Dr. Lucky Cowboy, last visit 03/2018, he has history of phlebitis He denies pain or edema  Patient Active Problem List   Diagnosis Date Noted  . Essential hypertension, benign 02/23/2018  . Superficial thrombophlebitis 02/23/2018  . Phlebitis of left lower extremity 01/26/2018  . Paroxysmal atrial fibrillation ([place]) 03/03/2017  . Bradycardia 08/31/2015  . Muscle tear 04/20/2015  . Allergic rhinitis, seasonal 04/20/2015  . Chronic venous insufficiency 10/27/2014  . Intermittent tremor 10/27/2014  . ED (erectile dysfunction) of non-organic origin 10/27/2014  . Colon, diverticulosis 10/27/2014  . Benign prostatic hypertrophy without urinary obstruction 10/27/2014  . Systolic CHF, chronic ([place]) 08/08/2013  . Bundle branch block, right and left anterior fascicular 06/07/2013  . Atrial fibrillation ([place]) 10/20/2012  . Obstructive apnea 03/23/2012  . Polypharmacy 04/15/2011  . Hypercholesteremia 01/18/2011  . H/O squamous cell carcinoma of skin 01/18/2011  . History of major vascular surgery 01/18/2011    Past Surgical History:  Procedure Laterality Date  . ATRIAL FIBRILLATION  ABLATION N/A 03/03/2017   Procedure: Atrial Fibrillation Ablation;  Surgeon: [name], MD;  Location: [facility];  Service: Cardiovascular;  Laterality: N/A;  . COLONOSCOPY WITH PROPOFOL N/A 11/20/2014   Procedure: COLONOSCOPY WITH PROPOFOL;  Surgeon: Manya Silvas, MD;  Location: [facility] ENDOSCOPY;  Service: Endoscopy;  Laterality: N/A;  . rib cartilage     benign tumor  . SKIN CANCER EXCISION Right 06/2010   lower leg  . TONSILLECTOMY AND ADENOIDECTOMY    . VEIN LIGATION AND STRIPPING      Family History  Problem Relation Age of Onset  . Suicidality Mother   . Leukemia Father   . Hypercholesterolemia Father   . Hypertension Brother   . Hyperlipidemia Brother   . Cirrhosis Brother   . Colon cancer Maternal Grandmother     Social History   Socioeconomic History  . Marital status: Married    Spouse name: Neoma Laming  . Number of children: 3  . Years of education: 34  . Highest education level: Not on file  Occupational History  . Occupation: Product manager  Social Needs  . Financial resource strain: Not very hard  . Food insecurity:    Worry: Never true    Inability: Never true  . Transportation needs:    Medical: No    Non-medical: No  Tobacco Use  . Smoking status: Current Every Day Smoker    Packs/day: 1.00    Years: 35.00    Pack years: 35.00    Types: Cigarettes, Cigars  . Smokeless tobacco: Never Used  . Tobacco comment: patient  uses natural tobacco (rolls his own)  Substance and Sexual Activity  . Alcohol use: Yes    Alcohol/week: 1.0 standard drinks    Types: 1 Glasses of wine per week    Comment: 2-3 glasses of wine at night  . Drug use: No  . Sexual activity: Yes    Partners: Female    Birth control/protection: None  Lifestyle  . Physical activity:    Days per week: 6 days    Minutes per session: 30 min  . Stress: Not at all  Relationships  . Social connections:    Talks on phone: More than three times a week    Gets together:  Twice a week    Attends religious service: More than 4 times per year    Active member of club or organization: Yes    Attends meetings of clubs or organizations: 1 to 4 times per year    Relationship status: Married  . Intimate partner violence:    Fear of current or ex partner: No    Emotionally abused: No    Physically abused: No    Forced sexual activity: No  Other Topics Concern  . Not on file  Social History Narrative   Pt lives in [place] with spouse.  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     Current Outpatient Medications:  .  5-Hydroxytryptophan (5-HTP) 100 MG CAPS, Take 100 mg by mouth at bedtime. , Disp: , Rfl:  .  b complex vitamins capsule, Take 1 capsule by mouth daily., Disp: , Rfl:  .  Cholecalciferol (VITAMIN D3 PO), Take 1 capsule by mouth daily., Disp: , Rfl:  .  Coenzyme Q10 (CO Q 10) 100 MG CAPS, Take 100 mg by mouth daily., Disp: , Rfl:  .  Digestive Enzymes (DIGESTIVE ENZYME PO), Take 1 capsule by mouth 3 (three) times daily. , Disp: , Rfl:  .  diltiazem (CARDIZEM) 30 MG tablet, Take 30 mg by mouth 4 (four) times daily as needed (for heart). , Disp: , Rfl:  .  diphenhydrAMINE (BENADRYL) 25 MG tablet, Take 25 mg by mouth at bedtime as needed., Disp: , Rfl:  .  finasteride (PROSCAR) 5 MG tablet, Take 1 tablet (5 mg total) by mouth daily., Disp: 90 tablet, Rfl: 3 .  Flaxseed, Linseed, (FLAXSEED OIL PO), Take 1 each by mouth See admin instructions. Take 15 ml by mouth daily, Disp: , Rfl:  .  Homeopathic Products (LEG CRAMP RELIEF PO), Take 2 tablets by mouth at bedtime., Disp: , Rfl:  .  L-Theanine 100 MG CAPS, Take 100 mg by mouth 2 (two) times daily., Disp: , Rfl:  .  MAGNESIUM PO, Take 300 mg by mouth at bedtime. , Disp: , Rfl:  .  Melatonin 3 MG CAPS, Take 3 mg by mouth at bedtime. , Disp: , Rfl:  .  Menaquinone-7 (VITAMIN K2 PO), Take 1 tablet by mouth daily., Disp: , Rfl:  .  Misc Natural Products (BETA-SITOSTEROL PLANT STEROLS) CAPS,  Take 1 capsule by mouth daily., Disp: , Rfl:  .  Misc Natural Products (GLUCOSAMINE CHOND MSM FORMULA PO), Take 2 capsules by mouth 2 (two) times daily., Disp: , Rfl:  .  Multiple Vitamin (MULTIVITAMIN) tablet, Take 1 tablet by mouth daily., Disp: , Rfl:  .  NON FORMULARY, Take 1 tablet by mouth daily. AMPK Supplement, Disp: , Rfl:  .  NON FORMULARY, Take 1,000 mg by mouth 2 (two) times daily after a meal. CLA 1000  mg Supplement, Disp: , Rfl:  .  Omega-3 Fatty Acids (FISH OIL) 1000 MG CAPS, Take 1,000 mg by mouth daily. , Disp: , Rfl:  .  OVER THE COUNTER MEDICATION, Take 1 capsule by mouth daily. Bacopa Supplement, Disp: , Rfl:  .  OVER THE COUNTER MEDICATION, Take 2 tablets by mouth daily. Carb Shredder Supplement, Disp: , Rfl:  .  Polyvinyl Alcohol-Povidone (REFRESH OP), Place 2 drops into both eyes as needed (for dry eyes)., Disp: , Rfl:  .  Probiotic Product (PROBIOTIC DAILY PO), Take 1 capsule by mouth daily. , Disp: , Rfl:  .  sildenafil (REVATIO) 20 MG tablet, Take 1-5 tablets (20-100 mg total) by mouth daily as needed. (Patient taking differently: Take 20-100 mg by mouth daily as needed (for ED). ), Disp: 50 tablet, Rfl: 2 .  tamsulosin (FLOMAX) 0.4 MG CAPS capsule, Take 1 capsule (0.4 mg total) by mouth daily., Disp: 90 capsule, Rfl: 3 .  TAURINE PO, Take 150 mg by mouth daily., Disp: , Rfl:  .  TURMERIC CURCUMIN PO, Take 900 mg by mouth daily., Disp: , Rfl:  .  zinc gluconate 50 MG tablet, Take 50 mg by mouth daily., Disp: , Rfl:   No Known Allergies  I personally reviewed active problem list, medication list, allergies, family history, social history with the patient/caregiver today.   ROS  Constitutional: Negative for fever or weight change.  Respiratory: Negative for cough and shortness of breath.   Cardiovascular: Negative for chest pain or palpitations.  Gastrointestinal: Negative for abdominal pain, no bowel changes.  Musculoskeletal: Negative for gait problem or joint  swelling.  Skin: Negative for rash.  Neurological: Negative for dizziness or headache.  No other specific complaints in a complete review of systems (except as listed in HPI above).   Objective  Vitals:   05/14/18 0935  BP: 112/64  Pulse: (!) 52  Resp: 16  Temp: 97.6 F (36.4 C)  TempSrc: Oral  SpO2: 97%  Weight: 175 lb 8 oz (79.6 kg)  Height: 5\' 10"  (1.778 m)    Body mass index is 25.18 kg/m.  Physical Exam  Constitutional: Patient appears well-developed and well-nourished.  No distress.  HEENT: head atraumatic, normocephalic, pupils equal and reactive to light,  neck supple, throat within normal limits Cardiovascular: Normal rate, regular rhythm and normal heart sounds.  No murmur heard. No BLE edema. Pulmonary/Chest: Effort normal and breath sounds normal. No respiratory distress. Abdominal: Soft.  There is no tenderness. Psychiatric: Patient has a normal mood and affect. behavior is normal. Judgment and thought content normal.  PHQ2/9: Depression screen [facility] 2/9 05/14/2018 11/02/2017 09/02/2016 08/05/2016 01/18/2016  Decreased Interest 0 0 0 0 0  Down, Depressed, Hopeless 0 0 0 0 0  PHQ - 2 Score 0 0 0 0 0     Fall Risk: Fall Risk  05/14/2018 11/02/2017 09/02/2016 09/02/2016 08/05/2016  Falls in the past year? 0 Yes Yes No No  Comment - - tripped on a suit case - -  Number falls in past yr: 0 1 - - -  Injury with Fall? - Yes Yes - -  Follow up Falls prevention discussed - - - -      Assessment & Plan  1. Paroxysmal atrial fibrillation (HCC)  Doing well, needs to follow up with Dr. Fletcher Anon as recommended by Dr. Rayann Heman   2. Benign prostatic hyperplasia with urinary obstruction  On medication and denies side effects  3. Hypercholesteremia  Refuses statin therapy   4.  Erectile dysfunction, unspecified erectile dysfunction type  stable  5. History of atrioventricular nodal ablation  Doing well since ablation

## 2018-05-21 DIAGNOSIS — M9903 Segmental and somatic dysfunction of lumbar region: Secondary | ICD-10-CM | POA: Diagnosis not present

## 2018-05-21 DIAGNOSIS — M5136 Other intervertebral disc degeneration, lumbar region: Secondary | ICD-10-CM | POA: Diagnosis not present

## 2018-05-21 DIAGNOSIS — M9901 Segmental and somatic dysfunction of cervical region: Secondary | ICD-10-CM | POA: Diagnosis not present

## 2018-05-21 DIAGNOSIS — M531 Cervicobrachial syndrome: Secondary | ICD-10-CM | POA: Diagnosis not present

## 2018-05-21 DIAGNOSIS — M546 Pain in thoracic spine: Secondary | ICD-10-CM | POA: Diagnosis not present

## 2018-05-21 DIAGNOSIS — M9902 Segmental and somatic dysfunction of thoracic region: Secondary | ICD-10-CM | POA: Diagnosis not present

## 2018-06-22 DIAGNOSIS — M9902 Segmental and somatic dysfunction of thoracic region: Secondary | ICD-10-CM | POA: Diagnosis not present

## 2018-06-22 DIAGNOSIS — M531 Cervicobrachial syndrome: Secondary | ICD-10-CM | POA: Diagnosis not present

## 2018-06-22 DIAGNOSIS — M9901 Segmental and somatic dysfunction of cervical region: Secondary | ICD-10-CM | POA: Diagnosis not present

## 2018-06-22 DIAGNOSIS — M5136 Other intervertebral disc degeneration, lumbar region: Secondary | ICD-10-CM | POA: Diagnosis not present

## 2018-06-22 DIAGNOSIS — M9903 Segmental and somatic dysfunction of lumbar region: Secondary | ICD-10-CM | POA: Diagnosis not present

## 2018-06-22 DIAGNOSIS — M546 Pain in thoracic spine: Secondary | ICD-10-CM | POA: Diagnosis not present

## 2018-07-05 ENCOUNTER — Other Ambulatory Visit: Payer: Self-pay | Admitting: Family Medicine

## 2018-07-05 NOTE — Telephone Encounter (Signed)
Copied from [place] [id number]. Topic: Quick Communication - See Telephone Encounter >> Jul 05, 2018  4:41 PM Ivar Drape wrote: CRM for notification. See Telephone encounter for: 07/05/18. Patient would like a refill on his sildenafil (REVATIO) 20 MG tablet medication and have it sent to this preferred pharmacy [facility] in [place].

## 2018-07-06 MED ORDER — SILDENAFIL CITRATE 20 MG PO TABS: 20.0000 mg | ORAL_TABLET | Freq: Every day | ORAL | 1 refills | Status: DC | PRN

## 2018-07-06 NOTE — Telephone Encounter (Signed)
Refill request for general medication: Sildenafil 20 mg  Last office visit: 05/14/2018  Last physical exam: 11/02/2017  Follow-ups on file. 11/15/2018

## 2018-07-07 DIAGNOSIS — M5136 Other intervertebral disc degeneration, lumbar region: Secondary | ICD-10-CM | POA: Diagnosis not present

## 2018-07-07 DIAGNOSIS — M531 Cervicobrachial syndrome: Secondary | ICD-10-CM | POA: Diagnosis not present

## 2018-07-07 DIAGNOSIS — M9903 Segmental and somatic dysfunction of lumbar region: Secondary | ICD-10-CM | POA: Diagnosis not present

## 2018-07-07 DIAGNOSIS — M546 Pain in thoracic spine: Secondary | ICD-10-CM | POA: Diagnosis not present

## 2018-07-07 DIAGNOSIS — M9902 Segmental and somatic dysfunction of thoracic region: Secondary | ICD-10-CM | POA: Diagnosis not present

## 2018-07-07 DIAGNOSIS — M9901 Segmental and somatic dysfunction of cervical region: Secondary | ICD-10-CM | POA: Diagnosis not present

## 2018-07-19 ENCOUNTER — Other Ambulatory Visit: Payer: Self-pay

## 2018-07-19 ENCOUNTER — Telehealth (INDEPENDENT_AMBULATORY_CARE_PROVIDER_SITE_OTHER): Payer: Medicare HMO | Admitting: Internal Medicine

## 2018-07-19 ENCOUNTER — Encounter: Payer: Self-pay | Admitting: Internal Medicine

## 2018-07-19 DIAGNOSIS — I10 Essential (primary) hypertension: Secondary | ICD-10-CM

## 2018-07-19 DIAGNOSIS — D485 Neoplasm of uncertain behavior of skin: Secondary | ICD-10-CM | POA: Diagnosis not present

## 2018-07-19 DIAGNOSIS — I4819 Other persistent atrial fibrillation: Secondary | ICD-10-CM

## 2018-07-19 DIAGNOSIS — L570 Actinic keratosis: Secondary | ICD-10-CM | POA: Diagnosis not present

## 2018-07-19 DIAGNOSIS — L578 Other skin changes due to chronic exposure to nonionizing radiation: Secondary | ICD-10-CM | POA: Diagnosis not present

## 2018-07-19 DIAGNOSIS — L821 Other seborrheic keratosis: Secondary | ICD-10-CM | POA: Diagnosis not present

## 2018-07-19 DIAGNOSIS — Z859 Personal history of malignant neoplasm, unspecified: Secondary | ICD-10-CM | POA: Diagnosis not present

## 2018-07-19 DIAGNOSIS — Z872 Personal history of diseases of the skin and subcutaneous tissue: Secondary | ICD-10-CM | POA: Diagnosis not present

## 2018-07-19 NOTE — Progress Notes (Signed)
Electrophysiology TeleHealth Note   Due to national recommendations of social distancing due to COVID 19, an audio/video telehealth visit is felt to be most appropriate for this patient at this time.  See MyChart message from today for the patient's consent to telehealth for [facility].   Date:  07/19/2018   ID:  Steven Griffin, DOB [birth year], MRN [id number]  Location: patient's home  Provider location: [address] [place]  Evaluation Performed: Follow-up visit  PCP:  Steele Sizer, MD  Cardiologist:  Dr Fletcher Anon Electrophysiologist:  Dr Rayann Heman  Chief Complaint:  afib  History of Present Illness:    Steven Griffin is a 73 y.o. male who presents via audio/video conferencing for a telehealth visit today.  Since last being seen in our clinic, the patient reports doing very well.  Today, he denies symptoms of palpitations, chest pain, shortness of breath,  lower extremity edema, dizziness, presyncope, or syncope.  The patient is otherwise without complaint today.  The patient denies symptoms of fevers, chills, cough, or new SOB worrisome for COVID 19.   Past Medical History:  Diagnosis Date  . Allergic rhinitis, cause unspecified   . Atrial fibrillation ([place])   . CHF (congestive heart failure) ([place])   . COPD (chronic obstructive pulmonary disease) ([place])    pt denies  . Diverticulosis of colon (without mention of hemorrhage)   . Essential hypertension, benign    pt denies  . Hyperlipidemia   . Impotence of organic origin   . Mild anxiety   . Skin cancer   . Unspecified venous (peripheral) insufficiency     Past Surgical History:  Procedure Laterality Date  . ATRIAL FIBRILLATION ABLATION N/A 03/03/2017   Procedure: Atrial Fibrillation Ablation;  Surgeon: [name], MD;  Location: [place] [facility];  Service: Cardiovascular;  Laterality: N/A;  . COLONOSCOPY WITH PROPOFOL N/A 11/20/2014   Procedure: COLONOSCOPY WITH PROPOFOL;  Surgeon: Manya Silvas, MD;  Location: [facility] ENDOSCOPY;  Service: Endoscopy;  Laterality: N/A;  . rib cartilage     benign tumor  . SKIN CANCER EXCISION Right 06/2010   lower leg  . TONSILLECTOMY AND ADENOIDECTOMY    . VEIN LIGATION AND STRIPPING      Current Outpatient Medications  Medication Sig Dispense Refill  . 5-Hydroxytryptophan (5-HTP) 100 MG CAPS Take 100 mg by mouth at bedtime.     Marland Kitchen b complex vitamins capsule Take 1 capsule by mouth daily.    . Cholecalciferol (VITAMIN D3 PO) Take 1 capsule by mouth daily.    . Coenzyme Q10 (CO Q 10) 100 MG CAPS Take 100 mg by mouth daily.    . Digestive Enzymes (DIGESTIVE ENZYME PO) Take 1 capsule by mouth 3 (three) times daily.     Marland Kitchen diltiazem (CARDIZEM) 30 MG tablet Take 30 mg by mouth 4 (four) times daily as needed (for heart).     . diphenhydrAMINE (BENADRYL) 25 MG tablet Take 25 mg by mouth at bedtime as needed.    . finasteride (PROSCAR) 5 MG tablet Take 1 tablet (5 mg total) by mouth daily. 90 tablet 3  . Flaxseed, Linseed, (FLAXSEED OIL PO) Take 1 each by mouth See admin instructions. Take 15 ml by mouth daily    . Homeopathic Products (LEG CRAMP RELIEF PO) Take 2 tablets by mouth at bedtime.    Marland Kitchen L-Theanine 100 MG CAPS Take 100 mg by mouth 2 (two) times daily.    Marland Kitchen MAGNESIUM PO Take 300  mg by mouth at bedtime.     . Melatonin 3 MG CAPS Take 3 mg by mouth at bedtime.     . Menaquinone-7 (VITAMIN K2 PO) Take 1 tablet by mouth daily.    . Misc Natural Products (BETA-SITOSTEROL PLANT STEROLS) CAPS Take 1 capsule by mouth daily.    . Misc Natural Products (GLUCOSAMINE CHOND MSM FORMULA PO) Take 2 capsules by mouth 2 (two) times daily.    . Multiple Vitamin (MULTIVITAMIN) tablet Take 1 tablet by mouth daily.    . NON FORMULARY Take 1 tablet by mouth daily. AMPK Supplement    . NON FORMULARY Take 1,000 mg by mouth 2 (two) times daily after a meal. CLA 1000 mg Supplement    . Omega-3 Fatty Acids (FISH OIL) 1000 MG CAPS Take 1,000 mg by mouth daily.     Marland Kitchen  OVER THE COUNTER MEDICATION Take 1 capsule by mouth daily. Bacopa Supplement    . OVER THE COUNTER MEDICATION Take 2 tablets by mouth daily. Carb Shredder Supplement    . Polyvinyl Alcohol-Povidone (REFRESH OP) Place 2 drops into both eyes as needed (for dry eyes).    . Probiotic Product (PROBIOTIC DAILY PO) Take 1 capsule by mouth daily.     . sildenafil (REVATIO) 20 MG tablet Take 1-5 tablets (20-100 mg total) by mouth daily as needed (for ED). 15 tablet 1  . tamsulosin (FLOMAX) 0.4 MG CAPS capsule Take 1 capsule (0.4 mg total) by mouth daily. 90 capsule 3  . TAURINE PO Take 150 mg by mouth daily.    . TURMERIC CURCUMIN PO Take 900 mg by mouth daily.    Marland Kitchen zinc gluconate 50 MG tablet Take 50 mg by mouth daily.     No current facility-administered medications for this visit.     Allergies:   Patient has no known allergies.   Social History:  The patient  reports that he has been smoking cigarettes and cigars. He has a 35.00 pack-year smoking history. He has never used smokeless tobacco. He reports current alcohol use of about 1.0 standard drinks of alcohol per week. He reports that he does not use drugs.   Family History:  The patient's  family history includes Cirrhosis in his brother; Colon cancer in his maternal grandmother; Hypercholesterolemia in his father; Hyperlipidemia in his brother; Hypertension in his brother; Leukemia in his father; Suicidality in his mother.   ROS:  Please see the history of present illness.   All other systems are personally reviewed and negative.    Exam:    Vital Signs:  BP (!) 154/63   Pulse (!) 54  Well appearing, alert and conversant, regular work of breathing,  good skin color Eyes- anicteric, neuro- grossly intact, skin- no apparent rash or lesions or cyanosis, mouth- oral mucosa is pink   Labs/Other Tests and Data Reviewed:    Recent Labs: 10/07/2017: ALT 18; BUN 15; Creatinine 0.9; Hemoglobin 15.3; Potassium 4.1; Sodium 142; TSH 2.31   Wt  Readings from Last 3 Encounters:  05/14/18 175 lb 8 oz (79.6 kg)  05/14/18 175 lb 8 oz (79.6 kg)  04/23/18 175 lb (79.4 kg)     Other studies personally reviewed: Additional studies/ records that were reviewed today include: my prior office visit  Review of the above records today demonstrates: as above Prior radiographs: cardiac CT 02/25/17 reveals calcium score 37 %   ASSESSMENT & PLAN:    1.  Persistent afib Doing very well chads2vasc score is 2 (new  HTN, age) Not on anticoagulation per patient preference  2. HTN New diagnosis Sodium restriction advised  3. COVID 19 screen The patient denies symptoms of COVID 19 at this time.  The importance of social distancing was discussed today.  Follow-up:  Follow-up with me again in a year unless problems arise  Current medicines are reviewed at length with the patient today.   The patient does not have concerns regarding his medicines.  The following changes were made today:  none  Labs/ tests ordered today include:  No orders of the defined types were placed in this encounter.   Patient Risk:  after full review of this patients clinical status, I feel that they are at moderate risk at this time.  Today, I have spent 22 minutes with the patient with telehealth technology discussing afib and new htn .    SignedThompson Grayer, MD  07/19/2018 3:17 PM     [address] [place] [address] [phone] (office) [phone] (fax)

## 2018-07-26 ENCOUNTER — Ambulatory Visit: Payer: Medicare HMO | Admitting: Internal Medicine

## 2018-07-28 DIAGNOSIS — M9902 Segmental and somatic dysfunction of thoracic region: Secondary | ICD-10-CM | POA: Diagnosis not present

## 2018-07-28 DIAGNOSIS — M546 Pain in thoracic spine: Secondary | ICD-10-CM | POA: Diagnosis not present

## 2018-07-28 DIAGNOSIS — M9901 Segmental and somatic dysfunction of cervical region: Secondary | ICD-10-CM | POA: Diagnosis not present

## 2018-07-28 DIAGNOSIS — M5136 Other intervertebral disc degeneration, lumbar region: Secondary | ICD-10-CM | POA: Diagnosis not present

## 2018-07-28 DIAGNOSIS — M531 Cervicobrachial syndrome: Secondary | ICD-10-CM | POA: Diagnosis not present

## 2018-07-28 DIAGNOSIS — M9903 Segmental and somatic dysfunction of lumbar region: Secondary | ICD-10-CM | POA: Diagnosis not present

## 2018-08-23 DIAGNOSIS — D044 Carcinoma in situ of skin of scalp and neck: Secondary | ICD-10-CM | POA: Diagnosis not present

## 2018-09-02 DIAGNOSIS — M5136 Other intervertebral disc degeneration, lumbar region: Secondary | ICD-10-CM | POA: Diagnosis not present

## 2018-09-02 DIAGNOSIS — M531 Cervicobrachial syndrome: Secondary | ICD-10-CM | POA: Diagnosis not present

## 2018-09-02 DIAGNOSIS — M9903 Segmental and somatic dysfunction of lumbar region: Secondary | ICD-10-CM | POA: Diagnosis not present

## 2018-09-02 DIAGNOSIS — M9902 Segmental and somatic dysfunction of thoracic region: Secondary | ICD-10-CM | POA: Diagnosis not present

## 2018-09-02 DIAGNOSIS — M546 Pain in thoracic spine: Secondary | ICD-10-CM | POA: Diagnosis not present

## 2018-09-02 DIAGNOSIS — M9901 Segmental and somatic dysfunction of cervical region: Secondary | ICD-10-CM | POA: Diagnosis not present

## 2018-09-02 LAB — HEPATIC FUNCTION PANEL
ALT: 19 (ref 10–40)
AST: 18 (ref 14–40)

## 2018-09-02 LAB — BASIC METABOLIC PANEL
BUN: 20 (ref 4–21)
Creatinine: 0.9 (ref 0.6–1.3)
Glucose: 105
Potassium: 4.5 (ref 3.4–5.3)
Sodium: 140 (ref 137–147)

## 2018-09-02 LAB — PSA: PSA: 2.2

## 2018-09-02 LAB — VITAMIN D 25 HYDROXY (VIT D DEFICIENCY, FRACTURES): Vit D, 25-Hydroxy: 4.08

## 2018-09-02 LAB — LIPID PANEL
Cholesterol: 185 (ref 0–200)
HDL: 55 (ref 35–70)
LDL Cholesterol: 108
Triglycerides: 112 (ref 40–160)

## 2018-09-02 LAB — HEMOGLOBIN A1C: Hemoglobin A1C: 5.2

## 2018-09-02 LAB — CBC AND DIFFERENTIAL: WBC: 5.9

## 2018-09-02 LAB — VITAMIN B12: Vitamin B-12: 627

## 2018-09-03 ENCOUNTER — Encounter: Payer: Self-pay | Admitting: Family Medicine

## 2018-09-06 ENCOUNTER — Encounter: Payer: Self-pay | Admitting: Family Medicine

## 2018-09-15 DIAGNOSIS — M25521 Pain in right elbow: Secondary | ICD-10-CM | POA: Diagnosis not present

## 2018-10-05 DIAGNOSIS — M9901 Segmental and somatic dysfunction of cervical region: Secondary | ICD-10-CM | POA: Diagnosis not present

## 2018-10-05 DIAGNOSIS — M9902 Segmental and somatic dysfunction of thoracic region: Secondary | ICD-10-CM | POA: Diagnosis not present

## 2018-10-05 DIAGNOSIS — M546 Pain in thoracic spine: Secondary | ICD-10-CM | POA: Diagnosis not present

## 2018-10-05 DIAGNOSIS — M531 Cervicobrachial syndrome: Secondary | ICD-10-CM | POA: Diagnosis not present

## 2018-10-05 DIAGNOSIS — M9903 Segmental and somatic dysfunction of lumbar region: Secondary | ICD-10-CM | POA: Diagnosis not present

## 2018-10-05 DIAGNOSIS — M5136 Other intervertebral disc degeneration, lumbar region: Secondary | ICD-10-CM | POA: Diagnosis not present

## 2018-10-28 DIAGNOSIS — M9903 Segmental and somatic dysfunction of lumbar region: Secondary | ICD-10-CM | POA: Diagnosis not present

## 2018-10-28 DIAGNOSIS — M9902 Segmental and somatic dysfunction of thoracic region: Secondary | ICD-10-CM | POA: Diagnosis not present

## 2018-10-28 DIAGNOSIS — M5136 Other intervertebral disc degeneration, lumbar region: Secondary | ICD-10-CM | POA: Diagnosis not present

## 2018-10-28 DIAGNOSIS — M9901 Segmental and somatic dysfunction of cervical region: Secondary | ICD-10-CM | POA: Diagnosis not present

## 2018-10-28 DIAGNOSIS — M546 Pain in thoracic spine: Secondary | ICD-10-CM | POA: Diagnosis not present

## 2018-10-28 DIAGNOSIS — M531 Cervicobrachial syndrome: Secondary | ICD-10-CM | POA: Diagnosis not present

## 2018-11-09 DIAGNOSIS — M9902 Segmental and somatic dysfunction of thoracic region: Secondary | ICD-10-CM | POA: Diagnosis not present

## 2018-11-09 DIAGNOSIS — M531 Cervicobrachial syndrome: Secondary | ICD-10-CM | POA: Diagnosis not present

## 2018-11-09 DIAGNOSIS — M9901 Segmental and somatic dysfunction of cervical region: Secondary | ICD-10-CM | POA: Diagnosis not present

## 2018-11-09 DIAGNOSIS — M9903 Segmental and somatic dysfunction of lumbar region: Secondary | ICD-10-CM | POA: Diagnosis not present

## 2018-11-09 DIAGNOSIS — M5136 Other intervertebral disc degeneration, lumbar region: Secondary | ICD-10-CM | POA: Diagnosis not present

## 2018-11-09 DIAGNOSIS — M546 Pain in thoracic spine: Secondary | ICD-10-CM | POA: Diagnosis not present

## 2018-11-15 ENCOUNTER — Other Ambulatory Visit: Payer: Self-pay

## 2018-11-15 ENCOUNTER — Ambulatory Visit (INDEPENDENT_AMBULATORY_CARE_PROVIDER_SITE_OTHER): Payer: Medicare HMO | Admitting: Family Medicine

## 2018-11-15 ENCOUNTER — Encounter: Payer: Self-pay | Admitting: Family Medicine

## 2018-11-15 VITALS — BP 122/68 | HR 64 | Temp 97.3°F | Resp 16 | Ht 70.0 in | Wt 171.5 lb

## 2018-11-15 DIAGNOSIS — D692 Other nonthrombocytopenic purpura: Secondary | ICD-10-CM

## 2018-11-15 DIAGNOSIS — N138 Other obstructive and reflux uropathy: Secondary | ICD-10-CM | POA: Diagnosis not present

## 2018-11-15 DIAGNOSIS — L309 Dermatitis, unspecified: Secondary | ICD-10-CM | POA: Diagnosis not present

## 2018-11-15 DIAGNOSIS — N401 Enlarged prostate with lower urinary tract symptoms: Secondary | ICD-10-CM | POA: Diagnosis not present

## 2018-11-15 DIAGNOSIS — N529 Male erectile dysfunction, unspecified: Secondary | ICD-10-CM

## 2018-11-15 DIAGNOSIS — Z0000 Encounter for general adult medical examination without abnormal findings: Secondary | ICD-10-CM

## 2018-11-15 DIAGNOSIS — Z720 Tobacco use: Secondary | ICD-10-CM | POA: Diagnosis not present

## 2018-11-15 MED ORDER — TAMSULOSIN HCL 0.4 MG PO CAPS: 0.4000 mg | ORAL_CAPSULE | Freq: Every day | ORAL | 3 refills | Status: DC

## 2018-11-15 MED ORDER — SILDENAFIL CITRATE 20 MG PO TABS: 20.0000 mg | ORAL_TABLET | Freq: Every day | ORAL | 2 refills | Status: DC | PRN

## 2018-11-15 MED ORDER — TRIAMCINOLONE ACETONIDE 0.1 % EX CREA: 1.0000 "application " | TOPICAL_CREAM | Freq: Two times a day (BID) | CUTANEOUS | 0 refills | Status: DC

## 2018-11-15 NOTE — Progress Notes (Signed)
Name: Steven Griffin   MRN: [id number]    DOB: 10/03/1945   Date:11/15/2018       Progress Note  Subjective  Chief Complaint  Chief Complaint  Patient presents with  . Annual Exam    HPI  Patient presents for annual CPE , he needs refill of flomax and also sildenafil   BPH: he is doing well on medication, mild symptoms now. ED controlled with prn medication  Rash: he noticed an itchy rash on both arms over the past 5 days, on antecubital area. Some blisters and red spots. Using hydrocortisone but not helping much  USPSTF grade A and B recommendations:  Diet: he eats healthy  Exercise: he is physically active, working out daily, still golfing, works in her yard   Depression: phq 9 is negative Depression screen [facility] 2/9 11/15/2018 05/14/2018 11/02/2017 09/02/2016 08/05/2016  Decreased Interest 0 0 0 0 0  Down, Depressed, Hopeless 0 0 0 0 0  PHQ - 2 Score 0 0 0 0 0  Altered sleeping 0 - - - -  Tired, decreased energy 0 - - - -  Change in appetite 0 - - - -  Feeling bad or failure about yourself  0 - - - -  Trouble concentrating 1 - - - -  Moving slowly or fidgety/restless 0 - - - -  Suicidal thoughts 0 - - - -  PHQ-9 Score 1 - - - -  Difficult doing work/chores Not difficult at all - - - -    Hypertension:  BP Readings from Last 3 Encounters:  11/15/18 122/68  07/19/18 (!) 154/63  05/14/18 112/64    Obesity: Wt Readings from Last 3 Encounters:  11/15/18 171 lb 8 oz (77.8 kg)  05/14/18 175 lb 8 oz (79.6 kg)  05/14/18 175 lb 8 oz (79.6 kg)   BMI Readings from Last 3 Encounters:  11/15/18 24.61 kg/m  05/14/18 25.18 kg/m  05/14/18 25.18 kg/m     Lipids:  Lab Results  Component Value Date   CHOL 185 09/02/2018   CHOL 205 (A) 10/07/2017   CHOL 151 12/26/2016   Lab Results  Component Value Date   HDL 55 09/02/2018   HDL 52 10/07/2017   HDL 52 12/26/2016   Lab Results  Component Value Date   LDLCALC 108 09/02/2018   LDLCALC 120 10/07/2017   LDLCALC 76  12/26/2016   Lab Results  Component Value Date   TRIG 112 09/02/2018   TRIG 166 (A) 10/07/2017   TRIG 113 12/26/2016   Lab Results  Component Value Date   CHOLHDL 3.0 08/31/2015   No results found for: LDLDIRECT Glucose:  Glucose  Date Value Ref Range Status  02/20/2017 92 65 - 99 mg/dL Final  12/18/2016 85 65 - 99 mg/dL Final  08/31/2015 107 (H) 65 - 99 mg/dL Final      [facility] 11/15/2018 [facility]  AUDIT-C Score  4      Married STD testing and prevention (HIV/chl/gon/syphilis): refused Hep C: up to date   Skin cancer: discussed atypical lesions  Colorectal cancer:repeat in 2026  Prostate cancer:  Lab Results  Component Value Date   PSA 2.2 09/02/2018   PSA 2.3 04/30/2018   PSA 3.1 10/07/2017    IPSS Questionnaire (AUA-7): Over the past month.   1)  How often have you had a sensation of not emptying your bladder completely after you finish urinating?  0 - Not at all  2)  How often have you had to urinate again less than two hours after you finished urinating? 0 - Not at all  3)  How often have you found you stopped and started again several times when you urinated?  1 - Less than 1 time in 5  4) How difficult have you found it to postpone urination?  0 - Not at all  5) How often have you had a weak urinary stream?  0 - Not at all  6) How often have you had to push or strain to begin urination?  1 - Less than 1 time in 5  7) How many times did you most typically get up to urinate from the time you went to bed until the time you got up in the morning?  0 - None  Total score:  0-7 mildly symptomatic   8-19 moderately symptomatic   20-35 severely symptomatic    Lung cancer:   Low Dose CT Chest recommended if Age 42-80 years, 30 pack-year currently smoking OR have quit w/in 15years. Patient does qualify.   AAA: normal cath 02/25/2017 ECG:  Up to date , sees cardiologist   Advanced Care Planning: A voluntary discussion about  advance care planning including the explanation and discussion of advance directives.  Discussed health care proxy and Living will, and the patient was able to identify a health care proxy as wife Neoma Laming.  Patient does have a living will at present time.   Patient Active Problem List   Diagnosis Date Noted  . Essential hypertension, benign 02/23/2018  . Superficial thrombophlebitis 02/23/2018  . Phlebitis of left lower extremity 01/26/2018  . Paroxysmal atrial fibrillation ([place]) 03/03/2017  . Bradycardia 08/31/2015  . Muscle tear 04/20/2015  . Allergic rhinitis, seasonal 04/20/2015  . Chronic venous insufficiency 10/27/2014  . Intermittent tremor 10/27/2014  . ED (erectile dysfunction) of non-organic origin 10/27/2014  . Colon, diverticulosis 10/27/2014  . Benign prostatic hypertrophy without urinary obstruction 10/27/2014  . Bundle branch block, right and left anterior fascicular 06/07/2013  . Atrial fibrillation ([place]) 10/20/2012  . Obstructive apnea 03/23/2012  . Polypharmacy 04/15/2011  . Hypercholesteremia 01/18/2011  . H/O squamous cell carcinoma of skin 01/18/2011  . History of major vascular surgery 01/18/2011    Past Surgical History:  Procedure Laterality Date  . ATRIAL FIBRILLATION ABLATION N/A 03/03/2017   Procedure: Atrial Fibrillation Ablation;  Surgeon: [name], MD;  Location: [place] [facility] LAB;  Service: Cardiovascular;  Laterality: N/A;  . COLONOSCOPY WITH PROPOFOL N/A 11/20/2014   Procedure: COLONOSCOPY WITH PROPOFOL;  Surgeon: Manya Silvas, MD;  Location: [facility] ENDOSCOPY;  Service: Endoscopy;  Laterality: N/A;  . rib cartilage     benign tumor  . SKIN CANCER EXCISION Right 06/2010   lower leg  . TONSILLECTOMY AND ADENOIDECTOMY    . VEIN LIGATION AND STRIPPING      Family History  Problem Relation Age of Onset  . Suicidality Mother   . Leukemia Father   . Hypercholesterolemia Father   . Hypertension Brother   . Hyperlipidemia Brother   .  Cirrhosis Brother   . Colon cancer Maternal Grandmother     Social History   Socioeconomic History  . Marital status: Married    Spouse name: Neoma Laming  . Number of children: 3  . Years of education: 6  . Highest education level: Not on file  Occupational History  . Occupation: Product manager  Social Needs  . Financial resource strain: Not very hard  . Food  insecurity    Worry: Never true    Inability: Never true  . Transportation needs    Medical: No    Non-medical: No  Tobacco Use  . Smoking status: Current Every Day Smoker    Packs/day: 1.00    Years: 35.00    Pack years: 35.00    Types: Cigarettes, Cigars  . Smokeless tobacco: Never Used  . Tobacco comment: patient uses natural tobacco (rolls his own)  Substance and Sexual Activity  . Alcohol use: Yes    Alcohol/week: 1.0 standard drinks    Types: 1 Glasses of wine per week    Comment: 2-3 glasses of wine at night  . Drug use: No  . Sexual activity: Yes    Partners: Female    Birth control/protection: None  Lifestyle  . Physical activity    Days per week: 6 days    Minutes per session: 30 min  . Stress: Not at all  Relationships  . Social connections    Talks on phone: More than three times a week    Gets together: Twice a week    Attends religious service: More than 4 times per year    Active member of club or organization: Yes    Attends meetings of clubs or organizations: 1 to 4 times per year    Relationship status: Married  . Intimate partner violence    Fear of current or ex partner: No    Emotionally abused: No    Physically abused: No    Forced sexual activity: No  Other Topics Concern  . Not on file  Social History Narrative   Pt lives in [place] with spouse.  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     Current Outpatient Medications:  .  5-Hydroxytryptophan (5-HTP) 100 MG CAPS, Take 100 mg by mouth at bedtime. , Disp: , Rfl:  .  b complex vitamins capsule,  Take 1 capsule by mouth daily., Disp: , Rfl:  .  Cholecalciferol (VITAMIN D3 PO), Take 1 capsule by mouth daily., Disp: , Rfl:  .  Coenzyme Q10 (CO Q 10) 100 MG CAPS, Take 100 mg by mouth daily., Disp: , Rfl:  .  Digestive Enzymes (DIGESTIVE ENZYME PO), Take 1 capsule by mouth 3 (three) times daily. , Disp: , Rfl:  .  diltiazem (CARDIZEM) 30 MG tablet, Take 30 mg by mouth 4 (four) times daily as needed (for heart). , Disp: , Rfl:  .  diphenhydrAMINE (BENADRYL) 25 MG tablet, Take 25 mg by mouth at bedtime as needed., Disp: , Rfl:  .  finasteride (PROSCAR) 5 MG tablet, Take 1 tablet (5 mg total) by mouth daily., Disp: 90 tablet, Rfl: 3 .  Flaxseed, Linseed, (FLAXSEED OIL PO), Take 1 each by mouth See admin instructions. Take 15 ml by mouth daily, Disp: , Rfl:  .  Homeopathic Products (LEG CRAMP RELIEF PO), Take 2 tablets by mouth at bedtime., Disp: , Rfl:  .  L-Theanine 100 MG CAPS, Take 100 mg by mouth 2 (two) times daily., Disp: , Rfl:  .  MAGNESIUM PO, Take 300 mg by mouth at bedtime. , Disp: , Rfl:  .  Melatonin 3 MG CAPS, Take 3 mg by mouth at bedtime. , Disp: , Rfl:  .  Menaquinone-7 (VITAMIN K2 PO), Take 1 tablet by mouth daily., Disp: , Rfl:  .  Misc Natural Products (BETA-SITOSTEROL PLANT STEROLS) CAPS, Take 1 capsule by mouth daily., Disp: , Rfl:  .  Misc  Natural Products (GLUCOSAMINE CHOND MSM FORMULA PO), Take 2 capsules by mouth 2 (two) times daily., Disp: , Rfl:  .  Multiple Vitamin (MULTIVITAMIN) tablet, Take 1 tablet by mouth daily., Disp: , Rfl:  .  NON FORMULARY, Take 1 tablet by mouth daily. AMPK Supplement, Disp: , Rfl:  .  NON FORMULARY, Take 1,000 mg by mouth 2 (two) times daily after a meal. CLA 1000 mg Supplement, Disp: , Rfl:  .  Omega-3 Fatty Acids (FISH OIL) 1000 MG CAPS, Take 1,000 mg by mouth daily. , Disp: , Rfl:  .  OVER THE COUNTER MEDICATION, Take 1 capsule by mouth daily. Bacopa Supplement, Disp: , Rfl:  .  OVER THE COUNTER MEDICATION, Take 2 tablets by mouth  daily. Carb Shredder Supplement, Disp: , Rfl:  .  Polyvinyl Alcohol-Povidone (REFRESH OP), Place 2 drops into both eyes as needed (for dry eyes)., Disp: , Rfl:  .  Probiotic Product (PROBIOTIC DAILY PO), Take 1 capsule by mouth daily. , Disp: , Rfl:  .  sildenafil (REVATIO) 20 MG tablet, Take 1-5 tablets (20-100 mg total) by mouth daily as needed (for ED)., Disp: 20 tablet, Rfl: 2 .  tamsulosin (FLOMAX) 0.4 MG CAPS capsule, Take 1 capsule (0.4 mg total) by mouth daily., Disp: 90 capsule, Rfl: 3 .  TAURINE PO, Take 500 mg by mouth daily. , Disp: , Rfl:  .  TURMERIC CURCUMIN PO, Take 900 mg by mouth daily., Disp: , Rfl:  .  zinc gluconate 50 MG tablet, Take 50 mg by mouth daily., Disp: , Rfl:   No Known Allergies   ROS  Constitutional: Negative for fever or weight change.  Respiratory: Negative for cough and shortness of breath.   Cardiovascular: Negative for chest pain or palpitations.  Gastrointestinal: Negative for abdominal pain, no bowel changes.  Musculoskeletal: Negative for gait problem or joint swelling.  Skin: Negative for rash.  Neurological: Negative for dizziness or headache.  No other specific complaints in a complete review of systems (except as listed in HPI above).   Objective  Vitals:   11/15/18 1055  BP: 122/68  Pulse: 64  Resp: 16  Temp: (!) 97.3 F (36.3 C)  TempSrc: Temporal  SpO2: 98%  Weight: 171 lb 8 oz (77.8 kg)  Height: 5\' 10"  (1.778 m)    Body mass index is 24.61 kg/m.  Physical Exam  Constitutional: Patient appears well-developed and well-nourished. No distress.  HENT: Head: Normocephalic and atraumatic. Ears: B TMs ok, no erythema or effusion; Nose: Nose normal. Mouth/Throat: Oropharynx is clear and moist. No oropharyngeal exudate.  Eyes: Conjunctivae and EOM are normal. Pupils are equal, round, and reactive to light. No scleral icterus.  Neck: Normal range of motion. Neck supple. No JVD present. No thyromegaly present.  Cardiovascular:  Normal rate, regular rhythm and normal heart sounds.  No murmur heard. No BLE edema. Pulmonary/Chest: Effort normal and breath sounds normal. No respiratory distress. Abdominal: Soft. Bowel sounds are normal, no distension. There is no tenderness. no masses MALE GENITALIA: Normal descended testes bilaterally, no masses palpated, no hernias, no lesions, no discharge RECTAL: Prostate slightly enlarged but normal  consistency, no rectal masses or hemorrhoids  Musculoskeletal: Normal range of motion, no joint effusions. No gross deformities Neurological: he is alert and oriented to person, place, and time. No cranial nerve deficit. Coordination, balance, strength, speech and gait are normal.  Skin: Skin is warm and dry. Senile purpura on left arm, erythematous papules on both antecubital areas , some blisters  Psychiatric:  Patient has a normal mood and affect. behavior is normal. Judgment and thought content normal.  Recent Results (from the past 2160 hour(s))  CBC and differential     Status: None   Collection Time: 09/02/18 12:00 AM  Result Value Ref Range   WBC 5.9   VITAMIN D 25 Hydroxy (Vit-D Deficiency, Fractures)     Status: None   Collection Time: 09/02/18 12:00 AM  Result Value Ref Range   Vit D, 25-Hydroxy 0.48   Basic metabolic panel     Status: None   Collection Time: 09/02/18 12:00 AM  Result Value Ref Range   Glucose 105    BUN 20 4 - 21   Creatinine 0.9 0.6 - 1.3   Potassium 4.5 3.4 - 5.3   Sodium 140 137 - 147  Lipid panel     Status: None   Collection Time: 09/02/18 12:00 AM  Result Value Ref Range   Triglycerides 112 40 - 160   Cholesterol 185 0 - 200   HDL 55 35 - 70   LDL Cholesterol 108   Hepatic function panel     Status: None   Collection Time: 09/02/18 12:00 AM  Result Value Ref Range   ALT 19 10 - 40   AST 18 14 - 40  Vitamin B12     Status: None   Collection Time: 09/02/18 12:00 AM  Result Value Ref Range   Vitamin B-12 627   Hemoglobin A1c     Status:  None   Collection Time: 09/02/18 12:00 AM  Result Value Ref Range   Hemoglobin A1C 5.2   PSA     Status: None   Collection Time: 09/02/18 12:00 AM  Result Value Ref Range   PSA 2.2      PHQ2/9: Depression screen [facility] 2/9 11/15/2018 05/14/2018 11/02/2017 09/02/2016 08/05/2016  Decreased Interest 0 0 0 0 0  Down, Depressed, Hopeless 0 0 0 0 0  PHQ - 2 Score 0 0 0 0 0  Altered sleeping 0 - - - -  Tired, decreased energy 0 - - - -  Change in appetite 0 - - - -  Feeling bad or failure about yourself  0 - - - -  Trouble concentrating 1 - - - -  Moving slowly or fidgety/restless 0 - - - -  Suicidal thoughts 0 - - - -  PHQ-9 Score 1 - - - -  Difficult doing work/chores Not difficult at all - - - -     Fall Risk: Fall Risk  11/15/2018 05/14/2018 11/02/2017 09/02/2016 09/02/2016  Falls in the past year? 0 0 Yes Yes No  Comment - - - tripped on a suit case -  Number falls in past yr: 0 0 1 - -  Injury with Fall? 0 - Yes Yes -  Follow up - Falls prevention discussed - - -     Functional Status Survey: Is the patient deaf or have difficulty hearing?: No Does the patient have difficulty seeing, even when wearing glasses/contacts?: Yes(reading glasses) Does the patient have difficulty concentrating, remembering, or making decisions?: No Does the patient have difficulty walking or climbing stairs?: No Does the patient have difficulty dressing or bathing?: No Does the patient have difficulty doing errands alone such as visiting a doctor's office or shopping?: No    Assessment & Plan  1. Encounter for routine history and physical exam for male   2. Benign prostatic hyperplasia with urinary obstruction  - tamsulosin (FLOMAX) 0.4  MG CAPS capsule; Take 1 capsule (0.4 mg total) by mouth daily.  Dispense: 90 capsule; Refill: 3  3. Erectile dysfunction, unspecified erectile dysfunction type  - sildenafil (REVATIO) 20 MG tablet; Take 1-5 tablets (20-100 mg total) by mouth daily as needed (for ED).   Dispense: 20 tablet; Refill: 2  4. Tobacco use  - CT CHEST LUNG CA SCREEN LOW DOSE W/O CM; Future  5. Dermatitis  - triamcinolone cream (KENALOG) 0.1 %; Apply 1 application topically 2 (two) times daily.  Dispense: 80 g; Refill: 0  6. Senile purpura ([place])  Patient was given reassurance  -Prostate cancer screening and PSA options (with potential risks and benefits of testing vs not testing) were discussed along with recent recs/guidelines. -USPSTF grade A and B recommendations reviewed with patient; age-appropriate recommendations, preventive care, screening tests, etc discussed and encouraged; healthy living encouraged; see AVS for patient education given to patient -Discussed importance of 150 minutes of physical activity weekly, eat two servings of fish weekly, eat one serving of tree nuts ( cashews, pistachios, pecans, almonds.Marland Kitchen) every other day, eat 6 servings of fruit/vegetables daily and drink plenty of water and avoid sweet beverages.

## 2018-11-15 NOTE — Patient Instructions (Signed)
Preventive Care 72 Years and Older, Male Preventive care refers to lifestyle choices and visits with your health care provider that can promote health and wellness. This includes:  A yearly physical exam. This is also called an annual well check.  Regular dental and eye exams.  Immunizations.  Screening for certain conditions.  Healthy lifestyle choices, such as diet and exercise. What can I expect for my preventive care visit? Physical exam Your health care provider will check:  Height and weight. These may be used to calculate body mass index (BMI), which is a measurement that tells if you are at a healthy weight.  Heart rate and blood pressure.  Your skin for abnormal spots. Counseling Your health care provider may ask you questions about:  Alcohol, tobacco, and drug use.  Emotional well-being.  Home and relationship well-being.  Sexual activity.  Eating habits.  History of falls.  Memory and ability to understand (cognition).  Work and work Statistician. What immunizations do I need?  Influenza (flu) vaccine  This is recommended every year. Tetanus, diphtheria, and pertussis (Tdap) vaccine  You may need a Td booster every 10 years. Varicella (chickenpox) vaccine  You may need this vaccine if you have not already been vaccinated. Zoster (shingles) vaccine  You may need this after age 50. Pneumococcal conjugate (PCV13) vaccine  One dose is recommended after age 24. Pneumococcal polysaccharide (PPSV23) vaccine  One dose is recommended after age 33. Measles, mumps, and rubella (MMR) vaccine  You may need at least one dose of MMR if you were born in 1957 or later. You may also need a second dose. Meningococcal conjugate (MenACWY) vaccine  You may need this if you have certain conditions. Hepatitis A vaccine  You may need this if you have certain conditions or if you travel or work in places where you may be exposed to hepatitis A. Hepatitis B vaccine   You may need this if you have certain conditions or if you travel or work in places where you may be exposed to hepatitis B. Haemophilus influenzae type b (Hib) vaccine  You may need this if you have certain conditions. You may receive vaccines as individual doses or as more than one vaccine together in one shot (combination vaccines). Talk with your health care provider about the risks and benefits of combination vaccines. What tests do I need? Blood tests  Lipid and cholesterol levels. These may be checked every 5 years, or more frequently depending on your overall health.  Hepatitis C test.  Hepatitis B test. Screening  Lung cancer screening. You may have this screening every year starting at age 72 if you have a 30-pack-year history of smoking and currently smoke or have quit within the past 15 years.  Colorectal cancer screening. All adults should have this screening starting at age 72 and continuing until age 54. Your health care provider may recommend screening at age 72 if you are at increased risk. You will have tests every 1-10 years, depending on your results and the type of screening test.  Prostate cancer screening. Recommendations will vary depending on your family history and other risks.  Diabetes screening. This is done by checking your blood sugar (glucose) after you have not eaten for a while (fasting). You may have this done every 1-3 years.  Abdominal aortic aneurysm (AAA) screening. You may need this if you are a current or former smoker.  Sexually transmitted disease (STD) testing. Follow these instructions at home: Eating and drinking  Eat  a diet that includes fresh fruits and vegetables, whole grains, lean protein, and low-fat dairy products. Limit your intake of foods with high amounts of sugar, saturated fats, and salt.  Take vitamin and mineral supplements as recommended by your health care provider.  Do not drink alcohol if your health care provider  tells you not to drink.  If you drink alcohol: ? Limit how much you have to 0-2 drinks a day. ? Be aware of how much alcohol is in your drink. In the [place] one drink equals one 12 oz bottle of beer (355 mL), one 5 oz glass of wine (148 mL), or one 1 oz glass of hard liquor (44 mL). Lifestyle  Take daily care of your teeth and gums.  Stay active. Exercise for at least 30 minutes on 5 or more days each week.  Do not use any products that contain nicotine or tobacco, such as cigarettes, e-cigarettes, and chewing tobacco. If you need help quitting, ask your health care provider.  If you are sexually active, practice safe sex. Use a condom or other form of protection to prevent STIs (sexually transmitted infections).  Talk with your health care provider about taking a low-dose aspirin or statin. What's next?  Visit your health care provider once a year for a well check visit.  Ask your health care provider how often you should have your eyes and teeth checked.  Stay up to date on all vaccines. This information is not intended to replace advice given to you by your health care provider. Make sure you discuss any questions you have with your health care provider. Document Released: 05/11/2015 Document Revised: 04/08/2018 Document Reviewed: 04/08/2018 Elsevier Patient Education  2020 Elsevier Inc.  

## 2018-11-16 ENCOUNTER — Telehealth: Payer: Self-pay | Admitting: *Deleted

## 2018-11-16 DIAGNOSIS — Z122 Encounter for screening for malignant neoplasm of respiratory organs: Secondary | ICD-10-CM

## 2018-11-16 DIAGNOSIS — Z87891 Personal history of nicotine dependence: Secondary | ICD-10-CM

## 2018-11-16 NOTE — Telephone Encounter (Signed)
Received referral for initial lung cancer screening scan. Contacted patient and obtained smoking history,(current, 35 pack year) as well as answering questions related to screening process. Patient denies signs of lung cancer such as weight loss or hemoptysis. Patient denies comorbidity that would prevent curative treatment if lung cancer were found. Patient is scheduled for shared decision making visit and CT scan on 11/25/18 at 145pm.

## 2018-11-25 ENCOUNTER — Ambulatory Visit
Admission: RE | Admit: 2018-11-25 | Discharge: 2018-11-25 | Disposition: A | Discharge status: Home / Self Care | Payer: Medicare HMO | Source: Ambulatory Visit | Attending: Oncology | Admitting: Oncology

## 2018-11-25 ENCOUNTER — Inpatient Hospital Stay: Payer: Medicare HMO | Attending: Oncology | Admitting: Nurse Practitioner

## 2018-11-25 ENCOUNTER — Other Ambulatory Visit: Payer: Self-pay

## 2018-11-25 DIAGNOSIS — R69 Illness, unspecified: Secondary | ICD-10-CM | POA: Diagnosis not present

## 2018-11-25 DIAGNOSIS — F17210 Nicotine dependence, cigarettes, uncomplicated: Secondary | ICD-10-CM | POA: Diagnosis not present

## 2018-11-25 DIAGNOSIS — Z122 Encounter for screening for malignant neoplasm of respiratory organs: Secondary | ICD-10-CM | POA: Diagnosis not present

## 2018-11-25 DIAGNOSIS — Z87891 Personal history of nicotine dependence: Secondary | ICD-10-CM | POA: Diagnosis not present

## 2018-11-25 NOTE — Progress Notes (Signed)
Virtual Visit via Video Enabled Telemedicine Note   I connected with Steven Griffin on 11/25/18 at 1:45 PM EST by video enabled telemedicine visit and verified that I am speaking with the correct person using two identifiers.   I discussed the limitations, risks, security and privacy concerns of performing an evaluation and management service by telemedicine and the availability of in-person appointments. I also discussed with the patient that there may be a patient responsible charge related to this service. The patient expressed understanding and agreed to proceed.   Other persons participating in the visit and their role in the encounter: Burgess Estelle, RN- checking in patient & navigation  Patient's location: clinic  Provider's location: home  Chief Complaint: Low Dose CT Screening  Patient agreed to evaluation by telemedicine to discuss shared decision making for consideration of low dose CT lung cancer screening.    In accordance with CMS guidelines, patient has met eligibility criteria including age, absence of signs or symptoms of lung cancer.  Social History   Tobacco Use  . Smoking status: Current Every Day Smoker    Packs/day: 1.00    Years: 35.00    Pack years: 35.00    Types: Cigarettes, Cigars  . Smokeless tobacco: Never Used  . Tobacco comment: patient uses natural tobacco (rolls his own)  Substance Use Topics  . Alcohol use: Yes    Alcohol/week: 1.0 standard drinks    Types: 1 Glasses of wine per week    Comment: 2-3 glasses of wine at night     A shared decision-making session was conducted prior to the performance of CT scan. This includes one or more decision aids, includes benefits and harms of screening, follow-up diagnostic testing, over-diagnosis, false positive rate, and total radiation exposure.   Counseling on the importance of adherence to annual lung cancer LDCT screening, impact of co-morbidities, and ability or willingness to undergo diagnosis and  treatment is imperative for compliance of the program.   Counseling on the importance of continued smoking cessation for former smokers; the importance of smoking cessation for current smokers, and information about tobacco cessation interventions have been given to patient including [place] and [facility] Quit  programs.   Written order for lung cancer screening with LDCT has been given to the patient and any and all questions have been answered to the best of my abilities.    Yearly follow up will be coordinated by Burgess Estelle, Thoracic Navigator.  I discussed the assessment and treatment plan with the patient. The patient was provided an opportunity to ask questions and all were answered. The patient agreed with the plan and demonstrated an understanding of the instructions.   The patient was advised to call back or seek an in-person evaluation if the symptoms worsen or if the condition fails to improve as anticipated.   I provided 15 minutes of face-to-face video visit time during this encounter, and > 50% was spent counseling as documented under my assessment & plan.   Beckey Rutter, DNP, AGNP-C [place] [facility] [phone] (work cell) [phone] (office)

## 2018-11-29 ENCOUNTER — Telehealth: Payer: Self-pay | Admitting: *Deleted

## 2018-11-29 NOTE — Telephone Encounter (Signed)
Notified patient of LDCT lung cancer screening program results with recommendation for 12 month follow up imaging. Also notified of incidental findings noted below and is encouraged to discuss further with PCP who will receive a copy of this note and/or the CT report. Patient verbalizes understanding.   IMPRESSION: 1. Lung-RADS 2, benign appearance or behavior. Continue annual screening with low-dose chest CT without contrast in 12 months. 2. Aortic atherosclerosis (ICD10-170.0). Coronary artery calcification. 3.  Emphysema (ICD10-J43.9).

## 2018-12-01 DIAGNOSIS — M531 Cervicobrachial syndrome: Secondary | ICD-10-CM | POA: Diagnosis not present

## 2018-12-01 DIAGNOSIS — M5136 Other intervertebral disc degeneration, lumbar region: Secondary | ICD-10-CM | POA: Diagnosis not present

## 2018-12-01 DIAGNOSIS — M9901 Segmental and somatic dysfunction of cervical region: Secondary | ICD-10-CM | POA: Diagnosis not present

## 2018-12-01 DIAGNOSIS — M9902 Segmental and somatic dysfunction of thoracic region: Secondary | ICD-10-CM | POA: Diagnosis not present

## 2018-12-01 DIAGNOSIS — M546 Pain in thoracic spine: Secondary | ICD-10-CM | POA: Diagnosis not present

## 2018-12-01 DIAGNOSIS — M9903 Segmental and somatic dysfunction of lumbar region: Secondary | ICD-10-CM | POA: Diagnosis not present

## 2018-12-27 IMAGING — CT CT HEART MORPH/PULM VEIN W/ CM & W/O CA SCORE
2 of 6 series · 10 of 20 positions shown, 12 images · IV contrast (APPLIED)
Comparison: None.

CLINICAL DATA: 71-year-old male with persistent atrial fibrillation
scheduled for an ablation.

EXAM:
Cardiac CT/CTA
TECHNIQUE: The patient was scanned on a Siemens Somatom scanner.

[Series 6: best diast · axial · 0.38mm/px · z∈[+1026,+1148]mm · 5 of 457 slices shown, 7 images]
[im 77/457  vessel]
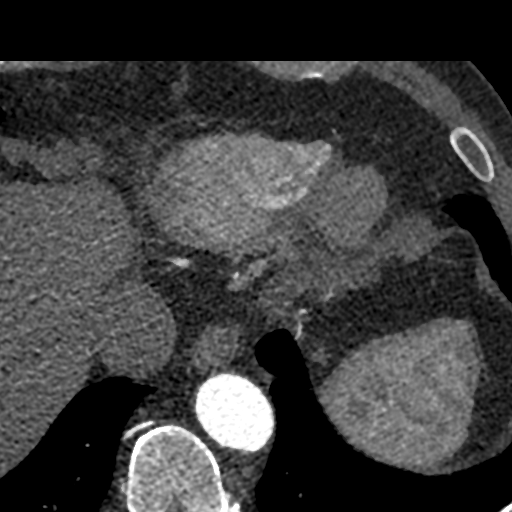
[im 77/457  lung]
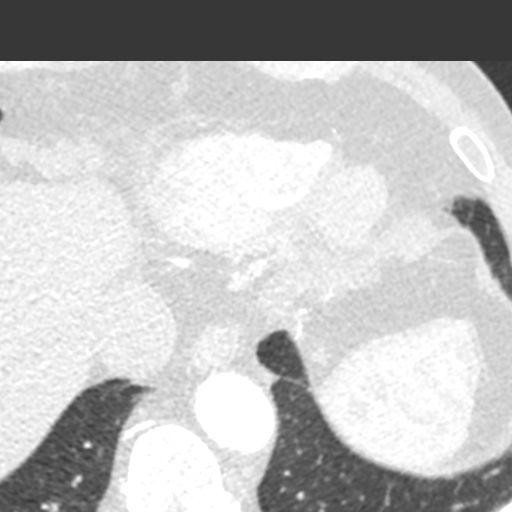
[im 153/457  vessel]
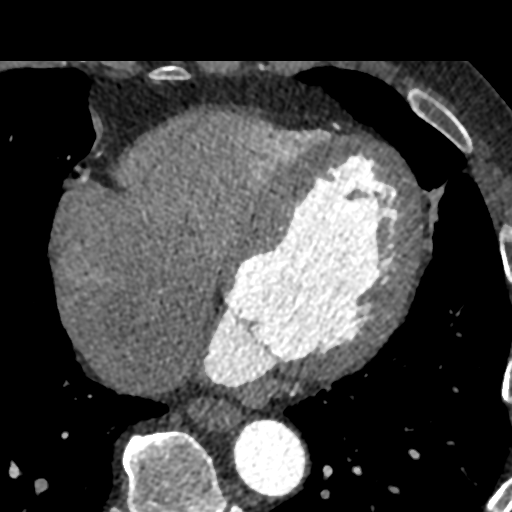
[im 229/457  vessel]
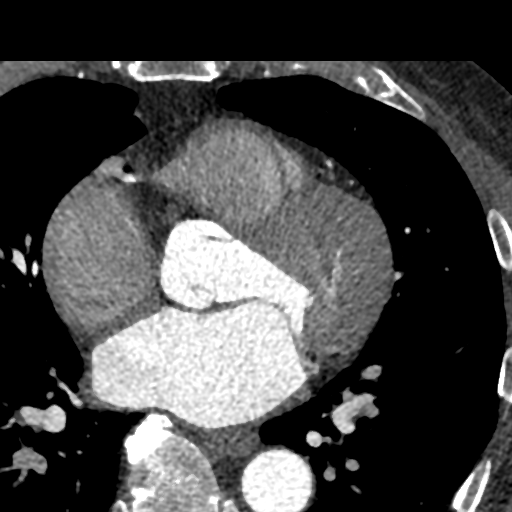
[im 305/457  vessel]
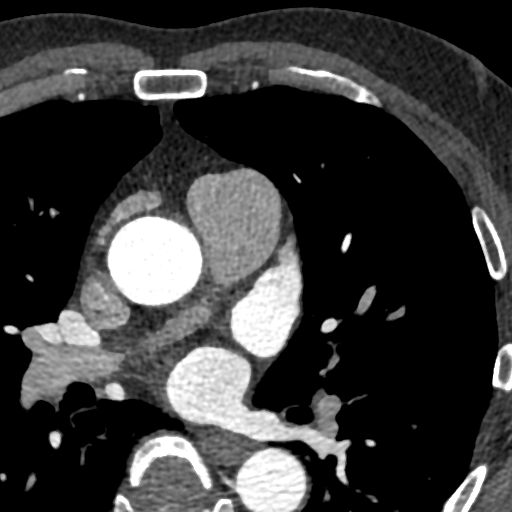
[im 381/457  vessel]
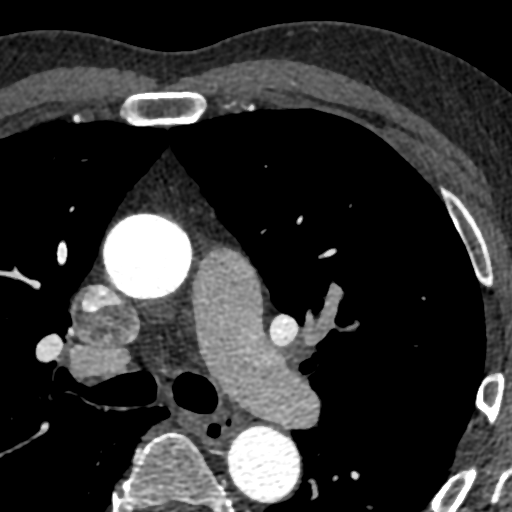
[im 381/457  lung]
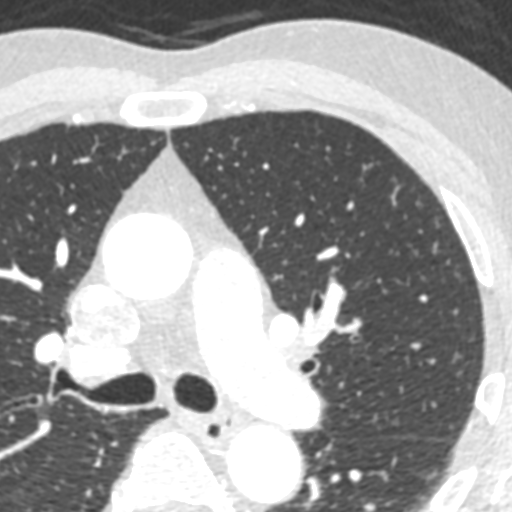

[Series 7: +300 ms · axial · 0.38mm/px · z∈[+1026,+1148]mm · 5 of 457 slices shown]
[im 77/457  vessel]
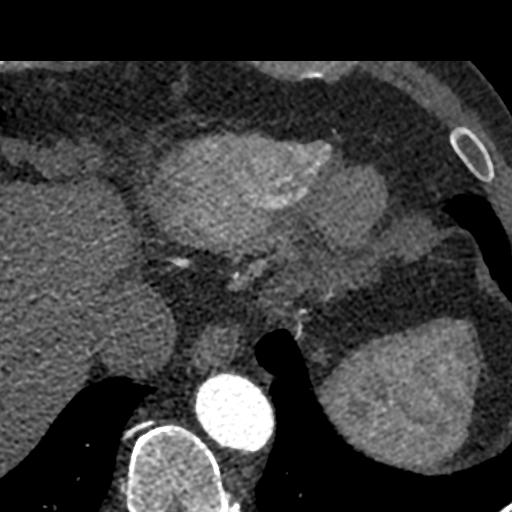
[im 153/457  vessel]
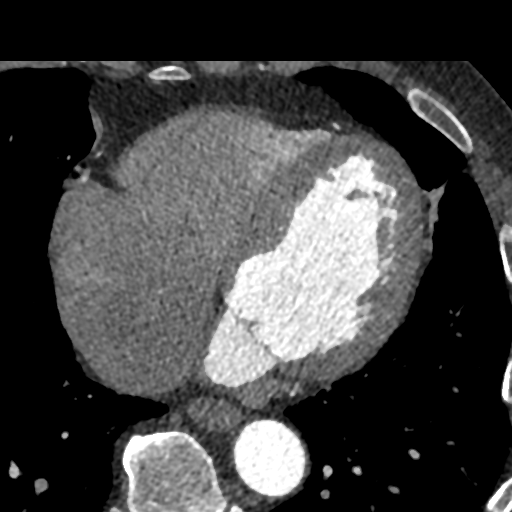
[im 229/457  vessel]
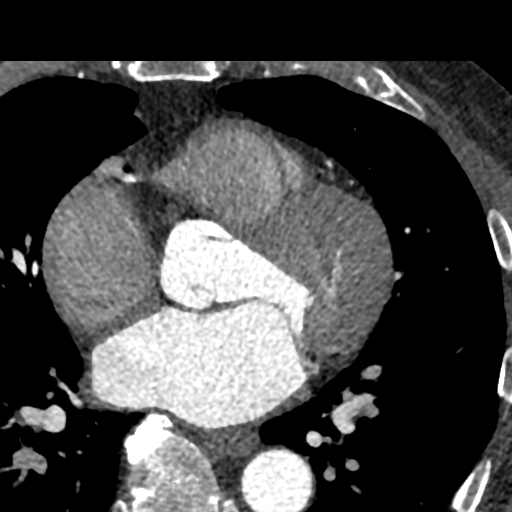
[im 305/457  vessel]
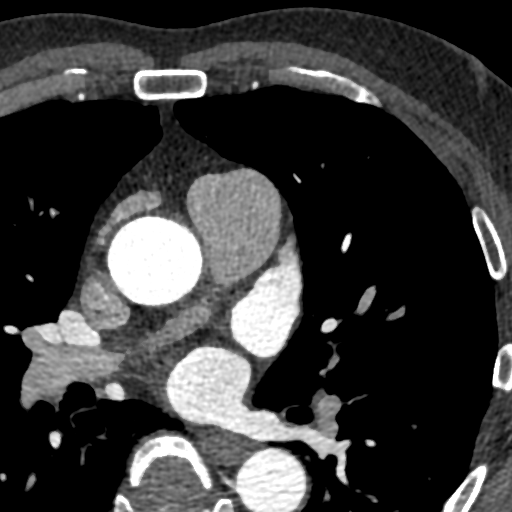
[im 381/457  vessel]
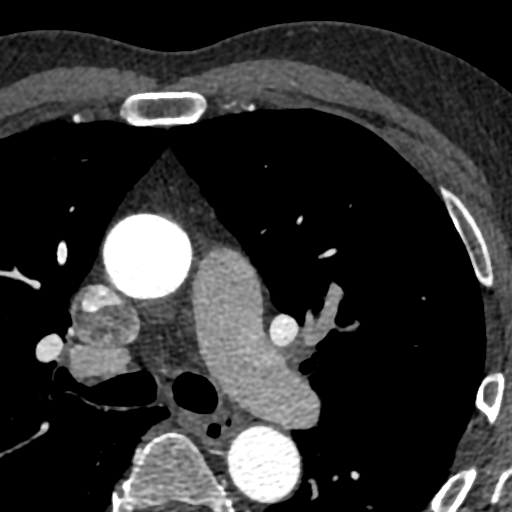

[10 of 20 positions shown; findings below may reference images not displayed]

FINDINGS: A 120 kV prospective scan was triggered in the descending thoracic
aorta at 111 HU's. Gantry rotation speed was 280 msecs and
collimation was .9 mm. No beta blockade and no NTG was given. The 3D
data set was reconstructed in 5% intervals of the 60-80 % of the R-R
cycle. Diastolic phases were analyzed on a dedicated work station
using MPR, MIP and VRT modes. The patient received 80 cc of
contrast.

There is normal pulmonary vein drainage into the left atrium (3 on
the right and 2 on the left) with ostial measurements as follows:

RUPV:  18 x 14 mm

RMPV:  9 x 7 mm

RLPV:  18 x 15 mm

LUPV:  26 x 21 mm

LLPV:  19 x 13 mm

The left atrial appendage is large with chicken wing morphology and
one major lobe. Ostial size 30 x 20 mm and length 50 mm. There is no
thrombus in the left atrial appendage.

The esophagus runs to the left from the left atrial midline and is
in the proximity to the left lower pulmonary vein.

Aorta:  Normal caliber.  No dissection or calcifications.

Aortic Valve:  Trileaflet.  No calcifications.

Coronary Arteries: Normal coronary origin. Right dominance. The
study was performed without use of NTG and insufficient for plaque
evaluation.
IMPRESSION: 1. There is normal pulmonary vein drainage into the left atrium.

2. The left atrial appendage is large with chicken wing morphology
and one major lobe. Ostial size 30 x 20 mm and length 50 mm. There
is no thrombus in the left atrial appendage.

3. The esophagus runs to the left from the left atrial midline and
is in the proximity to the left lower pulmonary vein.

4. Calcium score of 66 that represents 37 percentile for age/sex.
Normal coronary origin. Right dominance. The study was performed
without use of NTG and insufficient for plaque evaluation.

5. Mildly dilated pulmonary artery measuring 34 x 28 mm suggestive
of pulmonary hypertension.

EXAM:
OVER-READ INTERPRETATION  CT CHEST

The following report is an over-read performed by radiologist Dr.
Ferney David Noble [REDACTED] on 02/25/2017. This over-read
does not include interpretation of cardiac or coronary anatomy or
pathology. The coronary CTA interpretation by the cardiologist is
attached.
FINDINGS: Cardiovascular: Heart is normal size.  Aorta is normal caliber.

Mediastinum/Nodes: No adenopathy in the lower mediastinum or hila.

Lungs/Pleura: No confluent airspace opacities or effusions.

Upper Abdomen: Imaging into the upper abdomen shows no acute
findings.

Musculoskeletal: Chest wall soft tissues are unremarkable. No acute
bony abnormality.
IMPRESSION: No acute or significant extracardiac abnormality.

## 2019-01-17 DIAGNOSIS — L720 Epidermal cyst: Secondary | ICD-10-CM | POA: Diagnosis not present

## 2019-02-15 DIAGNOSIS — M9903 Segmental and somatic dysfunction of lumbar region: Secondary | ICD-10-CM | POA: Diagnosis not present

## 2019-02-15 DIAGNOSIS — M9901 Segmental and somatic dysfunction of cervical region: Secondary | ICD-10-CM | POA: Diagnosis not present

## 2019-02-15 DIAGNOSIS — M531 Cervicobrachial syndrome: Secondary | ICD-10-CM | POA: Diagnosis not present

## 2019-02-15 DIAGNOSIS — M546 Pain in thoracic spine: Secondary | ICD-10-CM | POA: Diagnosis not present

## 2019-02-15 DIAGNOSIS — M5136 Other intervertebral disc degeneration, lumbar region: Secondary | ICD-10-CM | POA: Diagnosis not present

## 2019-02-15 DIAGNOSIS — M9902 Segmental and somatic dysfunction of thoracic region: Secondary | ICD-10-CM | POA: Diagnosis not present

## 2019-02-23 ENCOUNTER — Other Ambulatory Visit: Payer: Self-pay

## 2019-02-23 ENCOUNTER — Ambulatory Visit (INDEPENDENT_AMBULATORY_CARE_PROVIDER_SITE_OTHER): Payer: Medicare HMO | Admitting: Family Medicine

## 2019-02-23 ENCOUNTER — Encounter: Payer: Self-pay | Admitting: Family Medicine

## 2019-02-23 DIAGNOSIS — Z5329 Procedure and treatment not carried out because of patient's decision for other reasons: Secondary | ICD-10-CM

## 2019-02-24 NOTE — Progress Notes (Signed)
No Show

## 2019-03-07 DIAGNOSIS — R69 Illness, unspecified: Secondary | ICD-10-CM | POA: Diagnosis not present

## 2019-03-21 ENCOUNTER — Other Ambulatory Visit: Payer: Self-pay

## 2019-03-21 ENCOUNTER — Ambulatory Visit
Admission: EM | Admit: 2019-03-21 | Discharge: 2019-03-21 | Disposition: A | Discharge status: Home / Self Care | Payer: Medicare HMO | Attending: Family Medicine | Admitting: Family Medicine

## 2019-03-21 DIAGNOSIS — M791 Myalgia, unspecified site: Secondary | ICD-10-CM | POA: Diagnosis not present

## 2019-03-21 DIAGNOSIS — R05 Cough: Secondary | ICD-10-CM | POA: Diagnosis not present

## 2019-03-21 DIAGNOSIS — B349 Viral infection, unspecified: Secondary | ICD-10-CM | POA: Diagnosis not present

## 2019-03-21 DIAGNOSIS — R5383 Other fatigue: Secondary | ICD-10-CM

## 2019-03-21 DIAGNOSIS — F17210 Nicotine dependence, cigarettes, uncomplicated: Secondary | ICD-10-CM

## 2019-03-21 DIAGNOSIS — R69 Illness, unspecified: Secondary | ICD-10-CM | POA: Diagnosis not present

## 2019-03-21 NOTE — ED Triage Notes (Signed)
Pt reports he was exposed to a friend who was positive for COVID on Nov 18. States a couple days now of mild cough, mild body aches and feeling tired. No fever. No headache

## 2019-03-21 NOTE — ED Provider Notes (Signed)
[facility]    CSN: [id number] Arrival date & time: 03/21/19  1503      History   Chief Complaint Chief Complaint  Patient presents with  . Generalized Body Aches  . Cough    HPI Steven Griffin is a 73 y.o. male.   73 yo male with a c/o mild cough, mild body aches and fatigue for a couple of days. Denies any fevers, chills. States he was exposed to a friend who tested positive for covid on Nov. 18.    Cough   Past Medical History:  Diagnosis Date  . Allergic rhinitis, cause unspecified   . Atrial fibrillation ([place])   . CHF (congestive heart failure) ([place])   . COPD (chronic obstructive pulmonary disease) ([place])    pt denies  . Diverticulosis of colon (without mention of hemorrhage)   . Essential hypertension, benign    pt denies  . Hyperlipidemia   . Impotence of organic origin   . Mild anxiety   . Skin cancer   . Unspecified venous (peripheral) insufficiency     Patient Active Problem List   Diagnosis Date Noted  . Personal history of tobacco use, presenting hazards to health 11/25/2018  . Essential hypertension, benign 02/23/2018  . Superficial thrombophlebitis 02/23/2018  . Phlebitis of left lower extremity 01/26/2018  . Paroxysmal atrial fibrillation ([place]) 03/03/2017  . Bradycardia 08/31/2015  . Muscle tear 04/20/2015  . Allergic rhinitis, seasonal 04/20/2015  . Chronic venous insufficiency 10/27/2014  . Intermittent tremor 10/27/2014  . ED (erectile dysfunction) of non-organic origin 10/27/2014  . Colon, diverticulosis 10/27/2014  . Benign prostatic hypertrophy without urinary obstruction 10/27/2014  . Bundle branch block, right and left anterior fascicular 06/07/2013  . Atrial fibrillation ([place]) 10/20/2012  . Obstructive apnea 03/23/2012  . Polypharmacy 04/15/2011  . Hypercholesteremia 01/18/2011  . H/O squamous cell carcinoma of skin 01/18/2011  . History of major vascular surgery 01/18/2011    Past Surgical History:  Procedure  Laterality Date  . ATRIAL FIBRILLATION ABLATION N/A 03/03/2017   Procedure: Atrial Fibrillation Ablation;  Surgeon: [name], MD;  Location: [facility];  Service: Cardiovascular;  Laterality: N/A;  . COLONOSCOPY WITH PROPOFOL N/A 11/20/2014   Procedure: COLONOSCOPY WITH PROPOFOL;  Surgeon: Manya Silvas, MD;  Location: [facility]COPY;  Service: Endoscopy;  Laterality: N/A;  . rib cartilage     benign tumor  . SKIN CANCER EXCISION Right 06/2010   lower leg  . TONSILLECTOMY AND ADENOIDECTOMY    . VEIN LIGATION AND STRIPPING         Home Medications    Prior to Admission medications   Medication Sig Start Date End Date Taking? Authorizing Provider  5-Hydroxytryptophan (5-HTP) 100 MG CAPS Take 100 mg by mouth at bedtime.     [provider]  b complex vitamins capsule Take 1 capsule by mouth daily.    [provider]  Cholecalciferol (VITAMIN D3 PO) Take 1 capsule by mouth daily.    [provider]  Coenzyme Q10 (CO Q 10) 100 MG CAPS Take 100 mg by mouth daily.    [provider]  Digestive Enzymes (DIGESTIVE ENZYME PO) Take 1 capsule by mouth 3 (three) times daily.     [provider]  diltiazem (CARDIZEM) 30 MG tablet Take 30 mg by mouth 4 (four) times daily as needed (for heart).     [provider]  diphenhydrAMINE (BENADRYL) 25 MG tablet Take 25 mg by mouth at bedtime as needed.  [provider]  finasteride (PROSCAR) 5 MG tablet Take 1 tablet (5 mg total) by mouth daily. 11/02/17   Sowles, Drue Stager, MD  Flaxseed, Linseed, (FLAXSEED OIL PO) Take 1 each by mouth See admin instructions. Take 15 ml by mouth daily    [provider]  Homeopathic Products (LEG CRAMP RELIEF PO) Take 2 tablets by mouth at bedtime.    [provider]  L-Theanine 100 MG CAPS Take 100 mg by mouth 2 (two) times daily.    [provider]  MAGNESIUM PO Take 300 mg by mouth at bedtime.     [provider]   Melatonin 3 MG CAPS Take 3 mg by mouth at bedtime.     [provider]  Menaquinone-7 (VITAMIN K2 PO) Take 1 tablet by mouth daily.    [provider]  Misc Natural Products (BETA-SITOSTEROL PLANT STEROLS) CAPS Take 1 capsule by mouth daily.    [provider]  Misc Natural Products (GLUCOSAMINE CHOND MSM FORMULA PO) Take 2 capsules by mouth 2 (two) times daily.    [provider]  Multiple Vitamin (MULTIVITAMIN) tablet Take 1 tablet by mouth daily.    [provider]  NON FORMULARY Take 1 tablet by mouth daily. AMPK Supplement    [provider]  NON FORMULARY Take 1,000 mg by mouth 2 (two) times daily after a meal. CLA 1000 mg Supplement    [provider]  Omega-3 Fatty Acids (FISH OIL) 1000 MG CAPS Take 1,000 mg by mouth daily.     [provider]  OVER THE COUNTER MEDICATION Take 1 capsule by mouth daily. Bacopa Supplement    [provider]  OVER THE COUNTER MEDICATION Take 2 tablets by mouth daily. Carb Shredder Supplement    [provider]  Polyvinyl Alcohol-Povidone (REFRESH OP) Place 2 drops into both eyes as needed (for dry eyes).    [provider]  Probiotic Product (PROBIOTIC DAILY PO) Take 1 capsule by mouth daily.     [provider]  sildenafil (REVATIO) 20 MG tablet Take 1-5 tablets (20-100 mg total) by mouth daily as needed (for ED). 11/15/18   Steele Sizer, MD  tamsulosin (FLOMAX) 0.4 MG CAPS capsule Take 1 capsule (0.4 mg total) by mouth daily. 11/15/18   Steele Sizer, MD  TAURINE PO Take 500 mg by mouth daily.     [provider]  triamcinolone cream (KENALOG) 0.1 % Apply 1 application topically 2 (two) times daily. 11/15/18   Steele Sizer, MD  TURMERIC CURCUMIN PO Take 900 mg by mouth daily.    [provider]  zinc gluconate 50 MG tablet Take 50 mg by mouth daily.    [provider]    Family History Family History  Problem  Relation Age of Onset  . Suicidality Mother   . Leukemia Father   . Hypercholesterolemia Father   . Hypertension Brother   . Hyperlipidemia Brother   . Cirrhosis Brother   . Colon cancer Maternal Grandmother     Social History Social History   Tobacco Use  . Smoking status: Current Every Day Smoker    Packs/day: 1.00    Years: 35.00    Pack years: 35.00    Types: Cigarettes, Cigars  . Smokeless tobacco: Never Used  . Tobacco comment: patient uses natural tobacco (rolls his own)  Substance Use Topics  . Alcohol use: Yes    Alcohol/week: 1.0 standard drinks    Types: 1 Glasses of wine per  week    Comment: 2-3 glasses of wine at night  . Drug use: No     Allergies   Patient has no known allergies.   Review of Systems Review of Systems  Respiratory: Positive for cough.      Physical Exam Triage Vital Signs ED Triage Vitals  Enc Vitals Group     BP 03/21/19 1531 (!) 145/66     Pulse Rate 03/21/19 1531 (!) 55     Resp 03/21/19 1531 18     Temp 03/21/19 1531 98.2 F (36.8 C)     Temp Source 03/21/19 1531 Oral     SpO2 03/21/19 1531 99 %     Weight 03/21/19 1531 165 lb (74.8 kg)     Height 03/21/19 1531 5\' 11"  (1.803 m)     Head Circumference --      Peak Flow --      Pain Score 03/21/19 1530 0     Pain Loc --      Pain Edu? --      Excl. in [place]? --    No data found.  Updated Vital Signs BP (!) 145/66 (BP Location: Left Arm)   Pulse (!) 55   Temp 98.2 F (36.8 C) (Oral)   Resp 18   Ht 5\' 11"  (1.803 m)   Wt 74.8 kg   SpO2 99%   BMI 23.01 kg/m   Visual Acuity Right Eye Distance:   Left Eye Distance:   Bilateral Distance:    Right Eye Near:   Left Eye Near:    Bilateral Near:     Physical Exam Vitals signs and nursing note reviewed.  Constitutional:      General: He is not in acute distress.    Appearance: Normal appearance. He is not toxic-appearing or diaphoretic.  Pulmonary:     Effort: Pulmonary effort is normal. No respiratory  distress.  Neurological:     Mental Status: He is alert.      UC Treatments / Results  Labs (all labs ordered are listed, but only abnormal results are displayed) Labs Reviewed  NOVEL CORONAVIRUS, NAA (HOSP ORDER, SEND-OUT TO REF LAB; TAT 18-24 HRS)    EKG   Radiology No results found.  Procedures Procedures (including critical care time)  Medications Ordered in UC Medications - No data to display  Initial Impression / Assessment and Plan / UC Course  I have reviewed the triage vital signs and the nursing notes.  Pertinent labs & imaging results that were available during my care of the patient were reviewed by me and considered in my medical decision making (see chart for details).      Final Clinical Impressions(s) / UC Diagnoses   Final diagnoses:  Viral syndrome    ED Prescriptions    None      1. diagnosis reviewed with patient 2. Recommend supportive treatment with rest, fluids, over the counter cold/cough medications as needed 3. covid test done 4. Follow-up prn if symptoms worsen or don't improve  PDMP not reviewed this encounter.   Norval Gable, MD 03/21/19 1651

## 2019-03-22 ENCOUNTER — Telehealth: Payer: Self-pay | Admitting: *Deleted

## 2019-03-22 DIAGNOSIS — N529 Male erectile dysfunction, unspecified: Secondary | ICD-10-CM | POA: Diagnosis not present

## 2019-03-22 DIAGNOSIS — R69 Illness, unspecified: Secondary | ICD-10-CM | POA: Diagnosis not present

## 2019-03-22 DIAGNOSIS — Z85828 Personal history of other malignant neoplasm of skin: Secondary | ICD-10-CM | POA: Diagnosis not present

## 2019-03-22 DIAGNOSIS — M1990 Unspecified osteoarthritis, unspecified site: Secondary | ICD-10-CM | POA: Diagnosis not present

## 2019-03-22 DIAGNOSIS — N400 Enlarged prostate without lower urinary tract symptoms: Secondary | ICD-10-CM | POA: Diagnosis not present

## 2019-03-22 DIAGNOSIS — Z823 Family history of stroke: Secondary | ICD-10-CM | POA: Diagnosis not present

## 2019-03-22 DIAGNOSIS — Z809 Family history of malignant neoplasm, unspecified: Secondary | ICD-10-CM | POA: Diagnosis not present

## 2019-03-22 LAB — NOVEL CORONAVIRUS, NAA (HOSP ORDER, SEND-OUT TO REF LAB; TAT 18-24 HRS): SARS-CoV-2, NAA: DETECTED — AB

## 2019-03-22 NOTE — Telephone Encounter (Signed)
Pt called to get his covid-19 test results. He viewed his results on MyChart. He is advised that he is positive and that the virus was detected in his swab. He stated that he had flu like symptoms on Saturday and Sunday. He denied having a fever at any time. He is advised to quarantine for 10 days and can come out if no fever at the end of quarantine without medication. He stated that he had been exposed a week before. He is advised to notify his pcp that he is positive. He stated that he has family coming from out of town. And staying with him. Advised to make sure he is wearing a mask, social distancing and cleaning. Will notify the health department.

## 2019-03-29 ENCOUNTER — Ambulatory Visit (INDEPENDENT_AMBULATORY_CARE_PROVIDER_SITE_OTHER): Payer: Medicare HMO | Admitting: Family Medicine

## 2019-03-29 ENCOUNTER — Other Ambulatory Visit: Payer: Self-pay

## 2019-03-29 ENCOUNTER — Other Ambulatory Visit: Payer: Self-pay | Admitting: Family Medicine

## 2019-03-29 ENCOUNTER — Encounter: Payer: Self-pay | Admitting: Family Medicine

## 2019-03-29 VITALS — Temp 98.6°F | Ht 70.0 in | Wt 165.0 lb

## 2019-03-29 DIAGNOSIS — J432 Centrilobular emphysema: Secondary | ICD-10-CM

## 2019-03-29 DIAGNOSIS — U071 COVID-19: Secondary | ICD-10-CM

## 2019-03-29 DIAGNOSIS — J441 Chronic obstructive pulmonary disease with (acute) exacerbation: Secondary | ICD-10-CM

## 2019-03-29 DIAGNOSIS — J208 Acute bronchitis due to other specified organisms: Secondary | ICD-10-CM

## 2019-03-29 MED ORDER — ALBUTEROL SULFATE HFA 108 (90 BASE) MCG/ACT IN AERS: 2.0000 | INHALATION_SPRAY | RESPIRATORY_TRACT | 1 refills | Status: DC | PRN

## 2019-03-29 MED ORDER — ALBUTEROL SULFATE 108 (90 BASE) MCG/ACT IN AEPB: 2.0000 | INHALATION_SPRAY | RESPIRATORY_TRACT | 1 refills | Status: DC | PRN

## 2019-03-29 MED ORDER — BENZONATATE 100 MG PO CAPS: 100.0000 mg | ORAL_CAPSULE | Freq: Three times a day (TID) | ORAL | 1 refills | Status: DC | PRN

## 2019-03-29 MED ORDER — AZITHROMYCIN 250 MG PO TABS: ORAL_TABLET | ORAL | 0 refills | Status: DC

## 2019-03-29 MED ORDER — PREDNISONE 20 MG PO TABS: ORAL_TABLET | ORAL | 0 refills | Status: DC

## 2019-03-29 NOTE — Progress Notes (Signed)
Name: Steven Griffin   MRN: [id number]    DOB: 12-01-1945   Date:03/29/2019       Progress Note  Subjective:    Chief Complaint  Chief Complaint  Patient presents with  . covid    positive, day 10 symptoms include: cough, cold symptoms    I connected with  [name]  on 03/29/19 at  1:20 PM EST by a video enabled telemedicine application and verified that I am speaking with the correct person using two identifiers.  I discussed the limitations of evaluation and management by telemedicine and the availability of in person appointments. The patient expressed understanding and agreed to proceed. Staff also discussed with the patient that there may be a patient responsible charge related to this service. Patient Location: home Provider Location: [facility] Additional Individuals present: none  HPI Pt is COVID positive, he was exposed to a friend who was COVID + on Nov 18th, he developed symptoms a few days later about 9 days ago.  He did have a positive Covid test on 03/21/2019 with an urgent care visit.  He states that he has had some mild intermittent coughing, some mild body aches and fever that resolved, he has had some fatigue but that is also improving.  He is taking over-the-counter cold and flu medicines, supplementing with zinc, vitamin D, vitamin C. he is working from home and self isolating.  He overall feels very good feel that he is almost completely better except for his cough he denies any productive sputum, hemoptysis, chest pain, shortness of breath, night sweats, near syncope, palpitations, lower extremity edema, diaphoresis, or exertional symptoms.  He is still not back to his normal energy level today he felt really good got up and did some work but he did take a 3-hour nap he got up after that nap and feels very good again.  But he does not normally take naps prior to Covid He did take some vital signs at home after talking to [place] and during the visit today  states his blood pressure is a little high for him, today is 160/83, HR 63.   He does have a documentation in the chart of significant smoking history including recently doing a low-dose CT scanning for lung cancer screening.  He states it was good and he does not have any lung disease and that although smoked for over 35 years he rolls his own tobacco and does not inhale.  He does have a little bit of a chronic and intermittent cough but no other chronic respiratory symptoms.  Per chart review -his low-dose CT test did show emphysema, aortic atherosclerosis and coronary artery calcification.   Patient Active Problem List   Diagnosis Date Noted  . Personal history of tobacco use, presenting hazards to health 11/25/2018  . Essential hypertension, benign 02/23/2018  . Superficial thrombophlebitis 02/23/2018  . Phlebitis of left lower extremity 01/26/2018  . Paroxysmal atrial fibrillation ([place]) 03/03/2017  . Bradycardia 08/31/2015  . Muscle tear 04/20/2015  . Allergic rhinitis, seasonal 04/20/2015  . Chronic venous insufficiency 10/27/2014  . Intermittent tremor 10/27/2014  . ED (erectile dysfunction) of non-organic origin 10/27/2014  . Colon, diverticulosis 10/27/2014  . Benign prostatic hypertrophy without urinary obstruction 10/27/2014  . Bundle branch block, right and left anterior fascicular 06/07/2013  . Atrial fibrillation ([place]) 10/20/2012  . Obstructive apnea 03/23/2012  . Polypharmacy 04/15/2011  . Hypercholesteremia 01/18/2011  . H/O squamous cell carcinoma of skin 01/18/2011  .  History of major vascular surgery 01/18/2011    Social History   Tobacco Use  . Smoking status: Current Every Day Smoker    Packs/day: 1.00    Years: 35.00    Pack years: 35.00    Types: Cigarettes, Cigars  . Smokeless tobacco: Never Used  . Tobacco comment: patient uses natural tobacco (rolls his own)  Substance Use Topics  . Alcohol use: Yes    Alcohol/week: 1.0 standard drinks    Types: 1  Glasses of wine per week    Comment: 2-3 glasses of wine at night     Current Outpatient Medications:  .  5-Hydroxytryptophan (5-HTP) 100 MG CAPS, Take 100 mg by mouth at bedtime. , Disp: , Rfl:  .  b complex vitamins capsule, Take 1 capsule by mouth daily., Disp: , Rfl:  .  Cholecalciferol (VITAMIN D3 PO), Take 1 capsule by mouth daily., Disp: , Rfl:  .  Coenzyme Q10 (CO Q 10) 100 MG CAPS, Take 100 mg by mouth daily., Disp: , Rfl:  .  Digestive Enzymes (DIGESTIVE ENZYME PO), Take 1 capsule by mouth 3 (three) times daily. , Disp: , Rfl:  .  diltiazem (CARDIZEM) 30 MG tablet, Take 30 mg by mouth 4 (four) times daily as needed (for heart). , Disp: , Rfl:  .  diphenhydrAMINE (BENADRYL) 25 MG tablet, Take 25 mg by mouth at bedtime as needed., Disp: , Rfl:  .  finasteride (PROSCAR) 5 MG tablet, Take 1 tablet (5 mg total) by mouth daily., Disp: 90 tablet, Rfl: 3 .  Flaxseed, Linseed, (FLAXSEED OIL PO), Take 1 each by mouth See admin instructions. Take 15 ml by mouth daily, Disp: , Rfl:  .  Homeopathic Products (LEG CRAMP RELIEF PO), Take 2 tablets by mouth at bedtime., Disp: , Rfl:  .  L-Theanine 100 MG CAPS, Take 100 mg by mouth 2 (two) times daily., Disp: , Rfl:  .  MAGNESIUM PO, Take 300 mg by mouth at bedtime. , Disp: , Rfl:  .  Melatonin 3 MG CAPS, Take 3 mg by mouth at bedtime. , Disp: , Rfl:  .  Menaquinone-7 (VITAMIN K2 PO), Take 1 tablet by mouth daily., Disp: , Rfl:  .  Misc Natural Products (BETA-SITOSTEROL PLANT STEROLS) CAPS, Take 1 capsule by mouth daily., Disp: , Rfl:  .  Misc Natural Products (GLUCOSAMINE CHOND MSM FORMULA PO), Take 2 capsules by mouth 2 (two) times daily., Disp: , Rfl:  .  Multiple Vitamin (MULTIVITAMIN) tablet, Take 1 tablet by mouth daily., Disp: , Rfl:  .  NON FORMULARY, Take 1 tablet by mouth daily. AMPK Supplement, Disp: , Rfl:  .  NON FORMULARY, Take 1,000 mg by mouth 2 (two) times daily after a meal. CLA 1000 mg Supplement, Disp: , Rfl:  .  Omega-3 Fatty  Acids (FISH OIL) 1000 MG CAPS, Take 1,000 mg by mouth daily. , Disp: , Rfl:  .  OVER THE COUNTER MEDICATION, Take 1 capsule by mouth daily. Bacopa Supplement, Disp: , Rfl:  .  OVER THE COUNTER MEDICATION, Take 2 tablets by mouth daily. Carb Shredder Supplement, Disp: , Rfl:  .  Polyvinyl Alcohol-Povidone (REFRESH OP), Place 2 drops into both eyes as needed (for dry eyes)., Disp: , Rfl:  .  Probiotic Product (PROBIOTIC DAILY PO), Take 1 capsule by mouth daily. , Disp: , Rfl:  .  sildenafil (REVATIO) 20 MG tablet, Take 1-5 tablets (20-100 mg total) by mouth daily as needed (for ED)., Disp: 20 tablet, Rfl: 2 .  tamsulosin (FLOMAX) 0.4 MG CAPS capsule, Take 1 capsule (0.4 mg total) by mouth daily., Disp: 90 capsule, Rfl: 3 .  TAURINE PO, Take 500 mg by mouth daily. , Disp: , Rfl:  .  TURMERIC CURCUMIN PO, Take 900 mg by mouth daily., Disp: , Rfl:  .  zinc gluconate 50 MG tablet, Take 50 mg by mouth daily., Disp: , Rfl:  .  triamcinolone cream (KENALOG) 0.1 %, Apply 1 application topically 2 (two) times daily. (Patient not taking: Reported on 03/29/2019), Disp: 80 g, Rfl: 0  No Known Allergies  I personally reviewed active problem list, medication list, allergies, family history, social history, health maintenance, notes from last encounter, lab results, imaging with the patient/caregiver today.  Review of Systems  Constitutional: Positive for fatigue. Negative for appetite change, chills and diaphoresis.  HENT: Negative.   Eyes: Negative.   Respiratory: Positive for cough. Negative for apnea, choking, chest tightness, shortness of breath, wheezing and stridor.   Cardiovascular: Negative.  Negative for chest pain, palpitations and leg swelling.  Gastrointestinal: Negative.   Endocrine: Negative.   Genitourinary: Negative.   Musculoskeletal: Negative.   Skin: Negative.  Negative for color change and pallor.  Allergic/Immunologic: Negative.   Neurological: Negative.  Negative for dizziness,  syncope, weakness, light-headedness, numbness and headaches.  Hematological: Negative.  Negative for adenopathy. Does not bruise/bleed easily.  Psychiatric/Behavioral: Negative.   All other systems reviewed and are negative.     Objective:   Virtual encounter, vitals limited, only able to obtain the following Today's Vitals   03/29/19 1223  Temp: 98.6 F (37 C)  Weight: 165 lb (74.8 kg)  Height: 5\' 10"  (1.778 m)   Body mass index is 23.68 kg/m. Nursing Note and Vital Signs reviewed.  Physical Exam Vitals signs and nursing note reviewed.  Constitutional:      General: He is not in acute distress.    Appearance: Normal appearance. He is well-developed. He is not ill-appearing, toxic-appearing or diaphoretic.     Comments: Overall very well-appearing male appears younger than stated age, no acute distress, nontoxic-appearing  HENT:     Head: Normocephalic and atraumatic.     Nose: Nose normal.  Eyes:     General:        Right eye: No discharge.        Left eye: No discharge.     Conjunctiva/sclera: Conjunctivae normal.  Neck:     Trachea: No tracheal deviation.  Cardiovascular:     Rate and Rhythm: Normal rate.  Pulmonary:     Effort: Pulmonary effort is normal. No respiratory distress.     Breath sounds: No stridor.     Comments: Frequent coughing consistently throughout the virtual encounter no visualized tachypnea, accessory muscle use, retractions, no audible wheeze or stridor Skin:    Findings: No rash.  Neurological:     Mental Status: He is alert.     Motor: No abnormal muscle tone.     Coordination: Coordination normal.  Psychiatric:        Mood and Affect: Mood normal.        Behavior: Behavior normal.     PE limited by telephone encounter  No results found for this or any previous visit (from the past 72 hour(s)).  Assessment and Plan:     ICD-10-CM   1. Acute bronchitis due to 2019 novel coronavirus  U07.1 predniSONE (DELTASONE) 20 MG tablet    J20.8 Albuterol Sulfate 108 (90 Base) MCG/ACT AEPB    benzonatate (  TESSALON) 100 MG capsule    azithromycin (ZITHROMAX) 250 MG tablet  2. Centrilobular emphysema (HCC)  J43.2 predniSONE (DELTASONE) 20 MG tablet    Albuterol Sulfate 108 (90 Base) MCG/ACT AEPB    benzonatate (TESSALON) 100 MG capsule    azithromycin (ZITHROMAX) 250 MG tablet  3. COPD with acute exacerbation (HCC)  J44.1 predniSONE (DELTASONE) 20 MG tablet    Albuterol Sulfate 108 (90 Base) MCG/ACT AEPB    benzonatate (TESSALON) 100 MG capsule    azithromycin (ZITHROMAX) 250 MG tablet    Patient Covid positive discussed isolation recommendations from CDC.  He has only a day or 2 to go before he is out of his 10-day.  Since symptom onset, he has been fever free for more than 24 hours but I would like for him to stay isolated until his respiratory symptoms start to improve since he is having difficulty holding a conversation he is able to speak in full but short sentences his cough is coming in small proximal fits but very frequent.   Going to treat him for COPD exacerbation/acute bronchitis with his smoking history despite what he says he does have evidence of lung disease on his recent CT scan.  We will give him a burst of steroids, Z-Pak and inhaler to use encouraged him to continue his over-the-counter cold and cough medicines he can also try Tessalon Perles.  Would like to do a really quick follow-up in a few days to get him completely cleared out of isolation.  We discussed the unknown about how long he may have symptoms or whether or not he will have some relapsing symptoms encouraged him to listen to his body get plenty of rest, push fluids and continue his healthy nutrition and supplements/vitamins.  Thoroughly reviewed CDC guidelines for returning to normal activities after being Covid positive and symptomatic.  He did inquire when he could go get retested, I explained to him that he does not need to retest normal [place]  test him within 90 days of being positive, we will do a symptom-based return to work her normal activity criteria per CDC were I reviewed it with him several times.  -Red flags and when to present for emergency care or RTC including fever >101.73F, chest pain, shortness of breath, new/worsening/un-resolving symptoms,  reviewed with patient at time of visit. Follow up and care instructions discussed and provided in AVS. - I discussed the assessment and treatment plan with the patient. The patient was provided an opportunity to ask questions and all were answered. The patient agreed with the plan and demonstrated an understanding of the instructions.  I provided 19 minutes of non-face-to-face time during this encounter.  Delsa Grana, PA-C 03/29/19 1:35 PM

## 2019-04-01 ENCOUNTER — Ambulatory Visit: Payer: Self-pay

## 2019-04-01 ENCOUNTER — Other Ambulatory Visit: Payer: Self-pay | Admitting: Family Medicine

## 2019-04-01 DIAGNOSIS — R110 Nausea: Secondary | ICD-10-CM

## 2019-04-01 MED ORDER — ONDANSETRON HCL 4 MG PO TABS: 4.0000 mg | ORAL_TABLET | Freq: Three times a day (TID) | ORAL | 0 refills | Status: DC | PRN

## 2019-04-01 NOTE — Telephone Encounter (Signed)
Spoke with patient and he wanted to know if a nausea medication could be sent to the pharmacy due him not be able to eat much.  Dr. Ancil Boozer sent in [place] to patient's pharmacy.  Per Dr. Ancil Boozer if patients continues to feel weak he needs to go to an Urgent Care.  Patient verbalized understanding.  Patient also asked if he could take tussex and per Dr. Ancil Boozer stated he could take it but had to be 4hrs apart from the Zofran.  Patient verbalized understanding.

## 2019-04-01 NOTE — Telephone Encounter (Signed)
Patient called stating that he has had COVID-19. 03/21/2019. He has been released from quarantine. His wife is hospitalized with the virus. He has recently completed a televisit and received a Zpac he is on day 2. He experiences random nausea and then vomits. He has a dry cough. He is requesting out patient infusion therapy information. Please advise patient  Answer Assessment - Initial Assessment Questions 1.   NAME of MEDICATION: "What medicine are you calling about?"     Infusion therapy for COVID 2.   QUESTION: "What is your question?"   How do I get it 3.   PRESCRIBING HCP: "Who prescribed it?" Reason: if prescribed by specialist, call should be referred to that group.    n/A 4. SYMPTOMS: "Do you have any symptoms?"    Cough random nausea and vomiting 5. SEVERITY: If symptoms are present, ask "Are they mild, moderate or severe?"     N/A 6.  PREGNANCY:  "Is there any chance that you are pregnant?" "When was your last menstrual period?"   N/A  Protocols used: MEDICATION QUESTION CALL-A-AH

## 2019-04-01 NOTE — Telephone Encounter (Signed)
  Reason for Disposition . [1] Caller has NON-URGENT medication question about med that PCP prescribed AND [2] triager unable to answer question  Protocols used: MEDICATION QUESTION CALL-A-AH

## 2019-04-12 DIAGNOSIS — Y9353 Activity, golf: Secondary | ICD-10-CM | POA: Diagnosis not present

## 2019-04-12 DIAGNOSIS — M542 Cervicalgia: Secondary | ICD-10-CM | POA: Diagnosis not present

## 2019-04-12 DIAGNOSIS — M9902 Segmental and somatic dysfunction of thoracic region: Secondary | ICD-10-CM | POA: Diagnosis not present

## 2019-04-12 DIAGNOSIS — M9901 Segmental and somatic dysfunction of cervical region: Secondary | ICD-10-CM | POA: Diagnosis not present

## 2019-04-12 DIAGNOSIS — M5414 Radiculopathy, thoracic region: Secondary | ICD-10-CM | POA: Diagnosis not present

## 2019-04-14 DIAGNOSIS — M542 Cervicalgia: Secondary | ICD-10-CM | POA: Diagnosis not present

## 2019-04-14 DIAGNOSIS — M5414 Radiculopathy, thoracic region: Secondary | ICD-10-CM | POA: Diagnosis not present

## 2019-04-14 DIAGNOSIS — Y9353 Activity, golf: Secondary | ICD-10-CM | POA: Diagnosis not present

## 2019-04-14 DIAGNOSIS — M9901 Segmental and somatic dysfunction of cervical region: Secondary | ICD-10-CM | POA: Diagnosis not present

## 2019-04-14 DIAGNOSIS — M9902 Segmental and somatic dysfunction of thoracic region: Secondary | ICD-10-CM | POA: Diagnosis not present

## 2019-04-20 DIAGNOSIS — M546 Pain in thoracic spine: Secondary | ICD-10-CM | POA: Diagnosis not present

## 2019-04-20 DIAGNOSIS — M9902 Segmental and somatic dysfunction of thoracic region: Secondary | ICD-10-CM | POA: Diagnosis not present

## 2019-04-20 DIAGNOSIS — M5136 Other intervertebral disc degeneration, lumbar region: Secondary | ICD-10-CM | POA: Diagnosis not present

## 2019-04-20 DIAGNOSIS — M531 Cervicobrachial syndrome: Secondary | ICD-10-CM | POA: Diagnosis not present

## 2019-04-20 DIAGNOSIS — M9903 Segmental and somatic dysfunction of lumbar region: Secondary | ICD-10-CM | POA: Diagnosis not present

## 2019-04-20 DIAGNOSIS — M9901 Segmental and somatic dysfunction of cervical region: Secondary | ICD-10-CM | POA: Diagnosis not present

## 2019-05-13 ENCOUNTER — Ambulatory Visit: Payer: Medicare HMO | Admitting: Family Medicine

## 2019-05-16 NOTE — Addendum Note (Signed)
Addended by: Clemetine Marker D on: 05/16/2019 02:36 PM   Modules accepted: Level of Service

## 2019-05-20 ENCOUNTER — Ambulatory Visit (INDEPENDENT_AMBULATORY_CARE_PROVIDER_SITE_OTHER): Payer: Medicare HMO | Admitting: Family Medicine

## 2019-05-20 ENCOUNTER — Other Ambulatory Visit: Payer: Self-pay

## 2019-05-20 ENCOUNTER — Encounter: Payer: Self-pay | Admitting: Family Medicine

## 2019-05-20 ENCOUNTER — Ambulatory Visit (INDEPENDENT_AMBULATORY_CARE_PROVIDER_SITE_OTHER): Payer: Medicare HMO

## 2019-05-20 VITALS — BP 133/81 | Ht 70.0 in | Wt 165.0 lb

## 2019-05-20 DIAGNOSIS — I700 Atherosclerosis of aorta: Secondary | ICD-10-CM | POA: Diagnosis not present

## 2019-05-20 DIAGNOSIS — Z0000 Encounter for general adult medical examination without abnormal findings: Secondary | ICD-10-CM

## 2019-05-20 DIAGNOSIS — N401 Enlarged prostate with lower urinary tract symptoms: Secondary | ICD-10-CM | POA: Diagnosis not present

## 2019-05-20 DIAGNOSIS — I480 Paroxysmal atrial fibrillation: Secondary | ICD-10-CM

## 2019-05-20 DIAGNOSIS — J432 Centrilobular emphysema: Secondary | ICD-10-CM | POA: Diagnosis not present

## 2019-05-20 DIAGNOSIS — D692 Other nonthrombocytopenic purpura: Secondary | ICD-10-CM

## 2019-05-20 DIAGNOSIS — N138 Other obstructive and reflux uropathy: Secondary | ICD-10-CM

## 2019-05-20 DIAGNOSIS — E7800 Pure hypercholesterolemia, unspecified: Secondary | ICD-10-CM

## 2019-05-20 DIAGNOSIS — I872 Venous insufficiency (chronic) (peripheral): Secondary | ICD-10-CM

## 2019-05-20 NOTE — Progress Notes (Signed)
Subjective:   Steven Griffin is a 74 y.o. male who presents for Medicare Annual/Subsequent preventive examination.  Review of Systems:   Cardiac Risk Factors include: advanced age (>62men, >14 women);hypertension;smoking/ tobacco exposure     Objective:    Vitals: BP 133/81   Ht 5\' 10"  (1.778 m)   Wt 165 lb (74.8 kg)   BMI 23.68 kg/m   Body mass index is 23.68 kg/m.  Advanced Directives 05/20/2019 03/21/2019 05/14/2018 03/03/2017 09/02/2016 08/05/2016 05/01/2016  Does Patient Have a Medical Advance Directive? Yes Yes Yes No Yes Yes Yes  Type of Paramedic of [place];Living will [place];Living will [place];Living will - [place];Living will [place];Living will [place];Living will  Does patient want to make changes to medical advance directive? - - - - - - -  Copy of [place] in Chart? No - copy requested - No - copy requested - No - copy requested No - copy requested -  Would patient like information on creating a medical advance directive? - - - No - Patient declined - - -    Tobacco Social History   Tobacco Use  Smoking Status Current Every Day Smoker  . Packs/day: 1.00  . Years: 35.00  . Pack years: 35.00  . Types: Cigarettes, Cigars  Smokeless Tobacco Never Used  Tobacco Comment   patient uses natural tobacco (rolls his own)     Ready to quit: Not Answered Counseling given: Not Answered Comment: patient uses natural tobacco (rolls his own)   Clinical Intake:  Pre-visit preparation completed: Yes  Pain : No/denies pain     BMI - recorded: 23.68 Nutritional Status: BMI of 19-24  Normal Nutritional Risks: None Diabetes: No  How often do you need to have someone help you when you read instructions, pamphlets, or other written materials from your doctor or pharmacy?: 1 - Never  Interpreter Needed?: No  Information  entered by :: Clemetine Marker LPN  Past Medical History:  Diagnosis Date  . Allergic rhinitis, cause unspecified   . Atrial fibrillation ([place])   . CHF (congestive heart failure) ([place])   . COPD (chronic obstructive pulmonary disease) ([place])    pt denies  . Diverticulosis of colon (without mention of hemorrhage)   . Essential hypertension, benign    pt denies  . Hyperlipidemia   . Impotence of organic origin   . Mild anxiety   . Personal history of covid-19   . Skin cancer   . Unspecified venous (peripheral) insufficiency    Past Surgical History:  Procedure Laterality Date  . ATRIAL FIBRILLATION ABLATION N/A 03/03/2017   Procedure: Atrial Fibrillation Ablation;  Surgeon: [name], MD;  Location: [place] [facility];  Service: Cardiovascular;  Laterality: N/A;  . COLONOSCOPY WITH PROPOFOL N/A 11/20/2014   Procedure: COLONOSCOPY WITH PROPOFOL;  Surgeon: Manya Silvas, MD;  Location: [facility] ENDOSCOPY;  Service: Endoscopy;  Laterality: N/A;  . rib cartilage     benign tumor  . SKIN CANCER EXCISION Right 06/2010   lower leg  . TONSILLECTOMY AND ADENOIDECTOMY    . VEIN LIGATION AND STRIPPING     Family History  Problem Relation Age of Onset  . Suicidality Mother   . Leukemia Father   . Hypercholesterolemia Father   . Hypertension Brother   . Hyperlipidemia Brother   . Cirrhosis Brother   . Colon cancer Maternal Grandmother    Social  History   Socioeconomic History  . Marital status: Married    Spouse name: Neoma Laming  . Number of children: 3  . Years of education: 32  . Highest education level: Not on file  Occupational History  . Occupation: Product manager  Tobacco Use  . Smoking status: Current Every Day Smoker    Packs/day: 1.00    Years: 35.00    Pack years: 35.00    Types: Cigarettes, Cigars  . Smokeless tobacco: Never Used  . Tobacco comment: patient uses natural tobacco (rolls his own)  Substance and Sexual Activity  . Alcohol use: Yes    Alcohol/week:  1.0 standard drinks    Types: 1 Glasses of wine per week    Comment: 2-3 glasses of wine at night  . Drug use: No  . Sexual activity: Yes    Partners: Female    Birth control/protection: None  Other Topics Concern  . Not on file  Social History Narrative   Pt lives in [place] with spouse.  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   Social Determinants of Health   Financial Resource Strain: Low Risk   . Difficulty of Paying Living Expenses: Not very hard  Food Insecurity: No Food Insecurity  . Worried About Charity fundraiser in the Last Year: Never true  . Ran Out of Food in the Last Year: Never true  Transportation Needs: No Transportation Needs  . Lack of Transportation (Medical): No  . Lack of Transportation (Non-Medical): No  Physical Activity: Sufficiently Active  . Days of Exercise per Week: 6 days  . Minutes of Exercise per Session: 60 min  Stress: No Stress Concern Present  . Feeling of Stress : Not at all  Social Connections: Not Isolated  . Frequency of Communication with Friends and Family: More than three times a week  . Frequency of Social Gatherings with Friends and Family: Twice a week  . Attends Religious Services: More than 4 times per year  . Active Member of Clubs or Organizations: Yes  . Attends Archivist Meetings: 1 to 4 times per year  . Marital Status: Married    Outpatient Encounter Medications as of 05/20/2019  Medication Sig  . 5-Hydroxytryptophan (5-HTP) 100 MG CAPS Take 100 mg by mouth at bedtime.   Marland Kitchen albuterol (VENTOLIN HFA) 108 (90 Base) MCG/ACT inhaler Inhale 2 puffs into the lungs every 4 (four) hours as needed for wheezing or shortness of breath.  Marland Kitchen b complex vitamins capsule Take 1 capsule by mouth daily.  . Cholecalciferol (VITAMIN D3 PO) Take 1 capsule by mouth daily.  . Coenzyme Q10 (CO Q 10) 100 MG CAPS Take 100 mg by mouth daily.  . Digestive Enzymes (DIGESTIVE ENZYME PO) Take 1 capsule by mouth 3 (three)  times daily.   Marland Kitchen diltiazem (CARDIZEM) 30 MG tablet Take 30 mg by mouth 4 (four) times daily as needed (for heart).   . diphenhydrAMINE (BENADRYL) 25 MG tablet Take 25 mg by mouth at bedtime as needed.  . finasteride (PROSCAR) 5 MG tablet Take 1 tablet (5 mg total) by mouth daily.  . Flaxseed, Linseed, (FLAXSEED OIL PO) Take 1 each by mouth See admin instructions. Take 15 ml by mouth daily  . Homeopathic Products (LEG CRAMP RELIEF PO) Take 2 tablets by mouth at bedtime.  Marland Kitchen L-Theanine 100 MG CAPS Take 100 mg by mouth 2 (two) times daily.  Marland Kitchen MAGNESIUM PO Take 300 mg by mouth at bedtime.   Marland Kitchen  Melatonin 3 MG CAPS Take 3 mg by mouth at bedtime.   . Menaquinone-7 (VITAMIN K2 PO) Take 1 tablet by mouth daily.  . Misc Natural Products (BETA-SITOSTEROL PLANT STEROLS) CAPS Take 1 capsule by mouth daily.  . Misc Natural Products (GLUCOSAMINE CHOND MSM FORMULA PO) Take 2 capsules by mouth 2 (two) times daily.  . Multiple Vitamin (MULTIVITAMIN) tablet Take 1 tablet by mouth daily.  . NON FORMULARY Take 1 tablet by mouth daily. AMPK Supplement  . NON FORMULARY Take 1,000 mg by mouth 2 (two) times daily after a meal. CLA 1000 mg Supplement  . Omega-3 Fatty Acids (FISH OIL) 1000 MG CAPS Take 1,000 mg by mouth daily.   Marland Kitchen OVER THE COUNTER MEDICATION Take 1 capsule by mouth daily. Bacopa Supplement  . OVER THE COUNTER MEDICATION Take 2 tablets by mouth daily. Carb Shredder Supplement  . Polyvinyl Alcohol-Povidone (REFRESH OP) Place 2 drops into both eyes as needed (for dry eyes).  . Probiotic Product (PROBIOTIC DAILY PO) Take 1 capsule by mouth daily.   . sildenafil (REVATIO) 20 MG tablet Take 1-5 tablets (20-100 mg total) by mouth daily as needed (for ED).  Marland Kitchen tamsulosin (FLOMAX) 0.4 MG CAPS capsule Take 1 capsule (0.4 mg total) by mouth daily.  . TURMERIC CURCUMIN PO Take 900 mg by mouth daily.  Marland Kitchen zinc gluconate 50 MG tablet Take 50 mg by mouth daily.  . [DISCONTINUED] Albuterol Sulfate 108 (90 Base) MCG/ACT  AEPB Inhale 2 puffs into the lungs every 4 (four) hours as needed (wheeze, SOB, coughing fits).  . [DISCONTINUED] azithromycin (ZITHROMAX) 250 MG tablet Take 2 tabs (500 mg) PO q d for 1d, then take 1 tab (250 mg) PO q d for day 2-5  . [DISCONTINUED] benzonatate (TESSALON) 100 MG capsule Take 1-2 capsules (100-200 mg total) by mouth 3 (three) times daily as needed for cough.  . [DISCONTINUED] ondansetron (ZOFRAN) 4 MG tablet Take 1 tablet (4 mg total) by mouth every 8 (eight) hours as needed for nausea or vomiting.  . [DISCONTINUED] predniSONE (DELTASONE) 20 MG tablet 3 tabs poqday 1-3, 2 tabs poqday 4-6, 1 tab poqday 7-9  . [DISCONTINUED] TAURINE PO Take 500 mg by mouth daily.   . [DISCONTINUED] triamcinolone cream (KENALOG) 0.1 % Apply 1 application topically 2 (two) times daily. (Patient not taking: Reported on 03/29/2019)   No facility-administered encounter medications on file as of 05/20/2019.    Activities of Daily Living In your present state of health, do you have any difficulty performing the following activities: 05/20/2019 03/29/2019  Hearing? N N  Vision? N N  Comment - -  Difficulty concentrating or making decisions? N N  Walking or climbing stairs? N N  Dressing or bathing? N N  Doing errands, shopping? N N  Preparing Food and eating ? N -  Using the Toilet? N -  In the past six months, have you accidently leaked urine? N -  Do you have problems with loss of bowel control? N -  Managing your Medications? N -  Managing your Finances? N -  Housekeeping or managing your Housekeeping? N -  Some recent data might be hidden    Patient Care Team: Steele Sizer, MD as PCP - General (Family Medicine) Wellington Hampshire, MD as Consulting Physician (Cardiology) [name], MD as Consulting Physician (Cardiology) Kathie Rhodes, MD as Consulting Physician (Urology)   Assessment:   This is a routine wellness examination for Steven Griffin.  Exercise Activities and Dietary  recommendations Current Exercise  Habits: Home exercise routine, Type of exercise: strength training/weights;stretching;walking;calisthenics, Time (Minutes): 60, Frequency (Times/Week): 6, Weekly Exercise (Minutes/Week): 360, Intensity: Moderate, Exercise limited by: None identified  Goals    . Patient Stated     Maintain active and healthy lifestyle       Fall Risk Fall Risk  05/20/2019 03/29/2019 11/15/2018 05/14/2018 11/02/2017  Falls in the past year? 0 0 0 0 Yes  Comment - - - - -  Number falls in past yr: 0 0 0 0 1  Injury with Fall? 0 0 0 - Yes  Risk for fall due to : No Fall Risks - - - -  Follow up Falls prevention discussed - - Falls prevention discussed -   FALL RISK PREVENTION PERTAINING TO THE HOME:  Any stairs in or around the home? Yes  If so, do they handrails? Yes   Home free of loose throw rugs in walkways, pet beds, electrical cords, etc? Yes  Adequate lighting in your home to reduce risk of falls? Yes   ASSISTIVE DEVICES UTILIZED TO PREVENT FALLS:  Life alert? No  Use of a cane, walker or w/c? No  Grab bars in the bathroom? No  Shower chair or bench in shower? No  Elevated toilet seat or a handicapped toilet? No   DME ORDERS:  DME order needed?  No   TIMED UP AND GO:  Was the test performed? No . Telephonic visit.   Education: Fall risk prevention has been discussed.  Intervention(s) required? No    Depression Screen PHQ 2/9 Scores 05/20/2019 03/29/2019 11/15/2018 05/14/2018  PHQ - 2 Score 0 0 0 0  PHQ- 9 Score - 0 1 -    Cognitive Function Pt declined 6CIT for 2021 AWV. Pt has no memory issues.      6CIT Screen 05/14/2018  What Year? 0 points  What month? 0 points  What time? 0 points  Count back from 20 0 points  Months in reverse 0 points  Repeat phrase 0 points  Total Score 0    Immunization History  Administered Date(s) Administered  . Influenza Split 03/22/2008  . Influenza, High Dose Seasonal PF 03/10/2018, 03/07/2019  . Influenza,  Seasonal, Injecte, Preservative Fre 02/27/2012, 07/12/2012  . Influenza-Unspecified 03/23/2015, 04/01/2016, 02/16/2017  . Pneumococcal Conjugate-13 08/28/2014  . Pneumococcal Polysaccharide-23 08/03/2012  . Td 06/17/2011  . Zoster 03/28/2010    Qualifies for Shingles Vaccine? Yes  Zostavax completed 2011.   Tdap: Up to date  Flu Vaccine: Up to date  Pneumococcal Vaccine: Up to date   Screening Tests Health Maintenance  Topic Date Due  . TETANUS/TDAP  06/16/2021  . COLONOSCOPY  11/19/2024  . INFLUENZA VACCINE  Completed  . Hepatitis C Screening  Completed  . PNA vac Low Risk Adult  Completed   Cancer Screenings:  Colorectal Screening: Completed 11/20/14. Repeat every 10 years;   Lung Cancer Screening: (Low Dose CT Chest recommended if Age 44-80 years, 30 pack-year currently smoking OR have quit w/in 15years.) does qualify. Completed 11/25/18.   Additional Screening:  Hepatitis C Screening: does qualify; Completed 08/31/15  Vision Screening: Recommended annual ophthalmology exams for early detection of glaucoma and other disorders of the eye. Is the patient up to date with their annual eye exam?  Yes  Who is the provider or what is the name of the office in which the pt attends annual eye exams? Dr. Mallie Mussel  Dental Screening: Recommended annual dental exams for proper oral hygiene  Community Resource Referral:  CRR required this visit?  No       Plan:    I have personally reviewed and addressed the Medicare Annual Wellness questionnaire and have noted the following in the patient's chart:  A. Medical and social history B. Use of alcohol, tobacco or illicit drugs  C. Current medications and supplements D. Functional ability and status E.  Nutritional status F.  Physical activity G. Advance directives H. List of other physicians I.  Hospitalizations, surgeries, and ER visits in previous 12 months J.  [place] such as hearing and vision if needed,  cognitive and depression L. Referrals and appointments   In addition, I have reviewed and discussed with patient certain preventive protocols, quality metrics, and best practice recommendations. A written personalized care plan for preventive services as well as general preventive health recommendations were provided to patient.   Signed,  Clemetine Marker, LPN Nurse Health Advisor   Nurse Notes: none

## 2019-05-20 NOTE — Progress Notes (Signed)
Name: Steven Griffin   MRN: [id number]    DOB: Feb 11, 1946   Date:05/20/2019       Progress Note  Subjective  Chief Complaint  Chief Complaint  Patient presents with   Follow-up    6 month F/U   Hyperlipidemia   Benign Prostatic Hypertrophy   Afib with recent AV node ablation   Chronic venous insufficiency    I connected with  [name]  on 05/20/19 at  9:40 AM EST by a video enabled telemedicine application and verified that I am speaking with the correct person using two identifiers.  I discussed the limitations of evaluation and management by telemedicine and the availability of in person appointments. The patient expressed understanding and agreed to proceed. Staff also discussed with the patient that there may be a patient responsible charge related to this service. Patient Location: at home  Provider Location: [facility]    HPI  Hyperlipidemia: he does not want to take statin therapy , last LDL was 126, a little higher than the time before   BPH: he used to see Dr. Rogers Blocker however since 2019 he switched provider and is now going to [facility] in [place], he brought a copy of his last PSA that was 2.29 Aug 2018. He is on Finasteride and Tamsulosin. He also takes generic viagra prn.  He states he will be having his PSA repeated soon and will send me a copy of results.   Afib with recent AV node ablation: Nov 2018, doing well , denies chest pain, palpitation,  or decrease in exercise tolerance Off medication. Dr. Rayann Heman calculated his [place] and did not recommend anti-coagulation . Unchanged   Chronic venous insufficiency: seeing Dr. Lucky Cowboy, last visit 03/2018, he has a history of phlebitis but not recurrence since first episode in 2019. He denies pain or edema.  Cognitive changes: his CIT-6 was normal, he states he does not feel as sharp as he used to, he has been taking more notes to not forget to do things. He states symptoms started after  COVID-19 infection Nov 2020. It is not getting worse . He is taking a brain supplementation , explained that exercise and mental activity is the goal   COPD: he had COVID-17 Mar 2019, he had a virtual visit with Delsa Grana and was given prednisone, albuterol and Zpack and he states symptoms of cough . He has some SOB, he states he is back to his baseline, no cough, wheezing or SOB  Aorta Atherosclerosis: he is not on statin therapy , he is on aspirin daily   Patient Active Problem List   Diagnosis Date Noted   Personal history of tobacco use, presenting hazards to health 11/25/2018   Essential hypertension, benign 02/23/2018   Superficial thrombophlebitis 02/23/2018   Phlebitis of left lower extremity 01/26/2018   Paroxysmal atrial fibrillation ([place]) 03/03/2017   Bradycardia 08/31/2015   Muscle tear 04/20/2015   Allergic rhinitis, seasonal 04/20/2015   Chronic venous insufficiency 10/27/2014   Intermittent tremor 10/27/2014   ED (erectile dysfunction) of non-organic origin 10/27/2014   Colon, diverticulosis 10/27/2014   Benign prostatic hypertrophy without urinary obstruction 10/27/2014   Bundle branch block, right and left anterior fascicular 06/07/2013   Atrial fibrillation ([place]) 10/20/2012   Obstructive apnea 03/23/2012   Polypharmacy 04/15/2011   Hypercholesteremia 01/18/2011   H/O squamous cell carcinoma of skin 01/18/2011   History of major vascular surgery 01/18/2011    Past Surgical History:  Procedure Laterality Date  ATRIAL FIBRILLATION ABLATION N/A 03/03/2017   Procedure: Atrial Fibrillation Ablation;  Surgeon: [name], MD;  Location: [place] [facility];  Service: Cardiovascular;  Laterality: N/A;   COLONOSCOPY WITH PROPOFOL N/A 11/20/2014   Procedure: COLONOSCOPY WITH PROPOFOL;  Surgeon: Manya Silvas, MD;  Location: [facility] ENDOSCOPY;  Service: Endoscopy;  Laterality: N/A;   rib cartilage     benign tumor   SKIN CANCER EXCISION Right  06/2010   lower leg   TONSILLECTOMY AND ADENOIDECTOMY     VEIN LIGATION AND STRIPPING      Family History  Problem Relation Age of Onset   Suicidality Mother    Leukemia Father    Hypercholesterolemia Father    Hypertension Brother    Hyperlipidemia Brother    Cirrhosis Brother    Colon cancer Maternal Grandmother       Current Outpatient Medications:    5-Hydroxytryptophan (5-HTP) 100 MG CAPS, Take 100 mg by mouth at bedtime. , Disp: , Rfl:    albuterol (VENTOLIN HFA) 108 (90 Base) MCG/ACT inhaler, Inhale 2 puffs into the lungs every 4 (four) hours as needed for wheezing or shortness of breath., Disp: 8 g, Rfl: 1   b complex vitamins capsule, Take 1 capsule by mouth daily., Disp: , Rfl:    Cholecalciferol (VITAMIN D3 PO), Take 1 capsule by mouth daily., Disp: , Rfl:    Coenzyme Q10 (CO Q 10) 100 MG CAPS, Take 100 mg by mouth daily., Disp: , Rfl:    Digestive Enzymes (DIGESTIVE ENZYME PO), Take 1 capsule by mouth 3 (three) times daily. , Disp: , Rfl:    diltiazem (CARDIZEM) 30 MG tablet, Take 30 mg by mouth 4 (four) times daily as needed (for heart). , Disp: , Rfl:    diphenhydrAMINE (BENADRYL) 25 MG tablet, Take 25 mg by mouth at bedtime as needed., Disp: , Rfl:    finasteride (PROSCAR) 5 MG tablet, Take 1 tablet (5 mg total) by mouth daily., Disp: 90 tablet, Rfl: 3   Flaxseed, Linseed, (FLAXSEED OIL PO), Take 1 each by mouth See admin instructions. Take 15 ml by mouth daily, Disp: , Rfl:    Homeopathic Products (LEG CRAMP RELIEF PO), Take 2 tablets by mouth at bedtime., Disp: , Rfl:    L-Theanine 100 MG CAPS, Take 100 mg by mouth 2 (two) times daily., Disp: , Rfl:    MAGNESIUM PO, Take 300 mg by mouth at bedtime. , Disp: , Rfl:    Melatonin 3 MG CAPS, Take 3 mg by mouth at bedtime. , Disp: , Rfl:    Menaquinone-7 (VITAMIN K2 PO), Take 1 tablet by mouth daily., Disp: , Rfl:    Misc Natural Products (BETA-SITOSTEROL PLANT STEROLS) CAPS, Take 1 capsule by  mouth daily., Disp: , Rfl:    Misc Natural Products (GLUCOSAMINE CHOND MSM FORMULA PO), Take 2 capsules by mouth 2 (two) times daily., Disp: , Rfl:    Multiple Vitamin (MULTIVITAMIN) tablet, Take 1 tablet by mouth daily., Disp: , Rfl:    NON FORMULARY, Take 1 tablet by mouth daily. AMPK Supplement, Disp: , Rfl:    NON FORMULARY, Take 1,000 mg by mouth 2 (two) times daily after a meal. CLA 1000 mg Supplement, Disp: , Rfl:    Omega-3 Fatty Acids (FISH OIL) 1000 MG CAPS, Take 1,000 mg by mouth daily. , Disp: , Rfl:    OVER THE COUNTER MEDICATION, Take 1 capsule by mouth daily. Bacopa Supplement, Disp: , Rfl:    OVER THE COUNTER MEDICATION, Take  2 tablets by mouth daily. Carb Shredder Supplement, Disp: , Rfl:    Polyvinyl Alcohol-Povidone (REFRESH OP), Place 2 drops into both eyes as needed (for dry eyes)., Disp: , Rfl:    Probiotic Product (PROBIOTIC DAILY PO), Take 1 capsule by mouth daily. , Disp: , Rfl:    sildenafil (REVATIO) 20 MG tablet, Take 1-5 tablets (20-100 mg total) by mouth daily as needed (for ED)., Disp: 20 tablet, Rfl: 2   tamsulosin (FLOMAX) 0.4 MG CAPS capsule, Take 1 capsule (0.4 mg total) by mouth daily., Disp: 90 capsule, Rfl: 3   TURMERIC CURCUMIN PO, Take 900 mg by mouth daily., Disp: , Rfl:    zinc gluconate 50 MG tablet, Take 50 mg by mouth daily., Disp: , Rfl:   No Known Allergies  I personally reviewed active problem list, medication list, allergies, family history, social history, health maintenance with the patient/caregiver today.   ROS  Ten systems reviewed and is negative except as mentioned in HPI   Objective  Virtual encounter, vitals  Obtained at home  Vitals:   05/20/19 0917  BP: 133/81  .  Body mass index is 23.68 kg/m.  Physical Exam  Awake, alert and oriented  PHQ2/9: Depression screen [facility] 2/9 05/20/2019 05/20/2019 03/29/2019 11/15/2018 05/14/2018  Decreased Interest 0 0 0 0 0  Down, Depressed, Hopeless 0 0 0 0 0  PHQ - 2 Score 0  0 0 0 0  Altered sleeping 0 - 0 0 -  Tired, decreased energy 0 - 0 0 -  Change in appetite 0 - 0 0 -  Feeling bad or failure about yourself  0 - 0 0 -  Trouble concentrating 0 - 0 1 -  Moving slowly or fidgety/restless 0 - 0 0 -  Suicidal thoughts 0 - 0 0 -  PHQ-9 Score 0 - 0 1 -  Difficult doing work/chores Not difficult at all - Not difficult at all Not difficult at all -   PHQ-2/9 Result is negative.    Fall Risk: Fall Risk  05/20/2019 03/29/2019 11/15/2018 05/14/2018 11/02/2017  Falls in the past year? 0 0 0 0 Yes  Comment - - - - -  Number falls in past yr: 0 0 0 0 1  Injury with Fall? 0 0 0 - Yes  Risk for fall due to : No Fall Risks - - - -  Follow up Falls prevention discussed - - Falls prevention discussed -     Assessment & Plan  1. Centrilobular emphysema (HCC)  Based on CT lung done   2. Benign prostatic hyperplasia with urinary obstruction  stable  3. Paroxysmal atrial fibrillation (HCC)  Stable at this time  4. Hypercholesteremia  Not on statin, he does not want to take it, he likes to take otc supplementation   5. Senile purpura (HCC)  Stable   6. Chronic venous insufficiency  Doing well at this time  7. Atherosclerosis of aorta ([place])  Discussed to continue aspirin   I discussed the assessment and treatment plan with the patient. The patient was provided an opportunity to ask questions and all were answered. The patient agreed with the plan and demonstrated an understanding of the instructions.  The patient was advised to call back or seek an in-person evaluation if the symptoms worsen or if the condition fails to improve as anticipated.  I provided 25  minutes of non-face-to-face time during this encounter.

## 2019-05-20 NOTE — Patient Instructions (Signed)
Steven Griffin , Thank you for taking time to come for your Medicare Wellness Visit. I appreciate your ongoing commitment to your health goals. Please review the following plan we discussed and let me know if I can assist you in the future.   Screening recommendations/referrals: Colonoscopy: done 11/20/14.  Recommended yearly ophthalmology/optometry visit for glaucoma screening and checkup Recommended yearly dental visit for hygiene and checkup  Vaccinations: Influenza vaccine: done 03/07/19 Pneumococcal vaccine: done 08/28/14 Tdap vaccine: done 06/17/11 Shingles vaccine: Shingrix discussed. Please contact your pharmacy for coverage information.   Advanced directives: Please bring a copy of your health care power of attorney and living will to the office at your convenience.  Conditions/risks identified: If you wish to quit smoking, help is available. For free tobacco cessation program offerings call the [facility] at [phone] or Live Well Line at (386) [phone]. You may also visit [email or web address] or email [email or web address] for more information on other programs.    Next appointment: Please follow up in one year for your Medicare Annual Wellness visit.    Preventive Care 75 Years and Older, Male Preventive care refers to lifestyle choices and visits with your health care provider that can promote health and wellness. What does preventive care include?  A yearly physical exam. This is also called an annual well check.  Dental exams once or twice a year.  Routine eye exams. Ask your health care provider how often you should have your eyes checked.  Personal lifestyle choices, including:  Daily care of your teeth and gums.  Regular physical activity.  Eating a healthy diet.  Avoiding tobacco and drug use.  Limiting alcohol use.  Practicing safe sex.  Taking low doses of aspirin every day.  Taking vitamin and mineral supplements as recommended by  your health care provider. What happens during an annual well check? The services and screenings done by your health care provider during your annual well check will depend on your age, overall health, lifestyle risk factors, and family history of disease. Counseling  Your health care provider may ask you questions about your:  Alcohol use.  Tobacco use.  Drug use.  Emotional well-being.  Home and relationship well-being.  Sexual activity.  Eating habits.  History of falls.  Memory and ability to understand (cognition).  Work and work Statistician. Screening  You may have the following tests or measurements:  Height, weight, and BMI.  Blood pressure.  Lipid and cholesterol levels. These may be checked every 5 years, or more frequently if you are over 70 years old.  Skin check.  Lung cancer screening. You may have this screening every year starting at age 98 if you have a 30-pack-year history of smoking and currently smoke or have quit within the past 15 years.  Fecal occult blood test (FOBT) of the stool. You may have this test every year starting at age 72.  Flexible sigmoidoscopy or colonoscopy. You may have a sigmoidoscopy every 5 years or a colonoscopy every 10 years starting at age 53.  Prostate cancer screening. Recommendations will vary depending on your family history and other risks.  Hepatitis C blood test.  Hepatitis B blood test.  Sexually transmitted disease (STD) testing.  Diabetes screening. This is done by checking your blood sugar (glucose) after you have not eaten for a while (fasting). You may have this done every 1-3 years.  Abdominal aortic aneurysm (AAA) screening. You may need this if you are a current or former smoker.  Osteoporosis.  You may be screened starting at age 80 if you are at high risk. Talk with your health care provider about your test results, treatment options, and if necessary, the need for more tests. Vaccines  Your  health care provider may recommend certain vaccines, such as:  Influenza vaccine. This is recommended every year.  Tetanus, diphtheria, and acellular pertussis (Tdap, Td) vaccine. You may need a Td booster every 10 years.  Zoster vaccine. You may need this after age 23.  Pneumococcal 13-valent conjugate (PCV13) vaccine. One dose is recommended after age 87.  Pneumococcal polysaccharide (PPSV23) vaccine. One dose is recommended after age 68. Talk to your health care provider about which screenings and vaccines you need and how often you need them. This information is not intended to replace advice given to you by your health care provider. Make sure you discuss any questions you have with your health care provider. Document Released: 05/11/2015 Document Revised: 01/02/2016 Document Reviewed: 02/13/2015 Elsevier Interactive Patient Education  2017 [place] Prevention in the Home Falls can cause injuries. They can happen to people of all ages. There are many things you can do to make your home safe and to help prevent falls. What can I do on the outside of my home?  Regularly fix the edges of walkways and driveways and fix any cracks.  Remove anything that might make you trip as you walk through a door, such as a raised step or threshold.  Trim any bushes or trees on the path to your home.  Use bright outdoor lighting.  Clear any walking paths of anything that might make someone trip, such as rocks or tools.  Regularly check to see if handrails are loose or broken. Make sure that both sides of any steps have handrails.  Any raised decks and porches should have guardrails on the edges.  Have any leaves, snow, or ice cleared regularly.  Use sand or salt on walking paths during winter.  Clean up any spills in your garage right away. This includes oil or grease spills. What can I do in the bathroom?  Use night lights.  Install grab bars by the toilet and in the tub and  shower. Do not use towel bars as grab bars.  Use non-skid mats or decals in the tub or shower.  If you need to sit down in the shower, use a plastic, non-slip stool.  Keep the floor dry. Clean up any water that spills on the floor as soon as it happens.  Remove soap buildup in the tub or shower regularly.  Attach bath mats securely with double-sided non-slip rug tape.  Do not have throw rugs and other things on the floor that can make you trip. What can I do in the bedroom?  Use night lights.  Make sure that you have a light by your bed that is easy to reach.  Do not use any sheets or blankets that are too big for your bed. They should not hang down onto the floor.  Have a firm chair that has side arms. You can use this for support while you get dressed.  Do not have throw rugs and other things on the floor that can make you trip. What can I do in the kitchen?  Clean up any spills right away.  Avoid walking on wet floors.  Keep items that you use a lot in easy-to-reach places.  If you need to reach something above you, use a strong step stool  that has a grab bar.  Keep electrical cords out of the way.  Do not use floor polish or wax that makes floors slippery. If you must use wax, use non-skid floor wax.  Do not have throw rugs and other things on the floor that can make you trip. What can I do with my stairs?  Do not leave any items on the stairs.  Make sure that there are handrails on both sides of the stairs and use them. Fix handrails that are broken or loose. Make sure that handrails are as long as the stairways.  Check any carpeting to make sure that it is firmly attached to the stairs. Fix any carpet that is loose or worn.  Avoid having throw rugs at the top or bottom of the stairs. If you do have throw rugs, attach them to the floor with carpet tape.  Make sure that you have a light switch at the top of the stairs and the bottom of the stairs. If you do not  have them, ask someone to add them for you. What else can I do to help prevent falls?  Wear shoes that:  Do not have high heels.  Have rubber bottoms.  Are comfortable and fit you well.  Are closed at the toe. Do not wear sandals.  If you use a stepladder:  Make sure that it is fully opened. Do not climb a closed stepladder.  Make sure that both sides of the stepladder are locked into place.  Ask someone to hold it for you, if possible.  Clearly mark and make sure that you can see:  Any grab bars or handrails.  First and last steps.  Where the edge of each step is.  Use tools that help you move around (mobility aids) if they are needed. These include:  Canes.  Walkers.  Scooters.  Crutches.  Turn on the lights when you go into a dark area. Replace any light bulbs as soon as they burn out.  Set up your furniture so you have a clear path. Avoid moving your furniture around.  If any of your floors are uneven, fix them.  If there are any pets around you, be aware of where they are.  Review your medicines with your doctor. Some medicines can make you feel dizzy. This can increase your chance of falling. Ask your doctor what other things that you can do to help prevent falls. This information is not intended to replace advice given to you by your health care provider. Make sure you discuss any questions you have with your health care provider. Document Released: 02/08/2009 Document Revised: 09/20/2015 Document Reviewed: 05/19/2014 Elsevier Interactive Patient Education  2017 [employer].

## 2019-05-23 DIAGNOSIS — N400 Enlarged prostate without lower urinary tract symptoms: Secondary | ICD-10-CM | POA: Diagnosis not present

## 2019-05-23 DIAGNOSIS — R972 Elevated prostate specific antigen [PSA]: Secondary | ICD-10-CM | POA: Diagnosis not present

## 2019-05-27 DIAGNOSIS — M9903 Segmental and somatic dysfunction of lumbar region: Secondary | ICD-10-CM | POA: Diagnosis not present

## 2019-05-27 DIAGNOSIS — M546 Pain in thoracic spine: Secondary | ICD-10-CM | POA: Diagnosis not present

## 2019-05-27 DIAGNOSIS — M9902 Segmental and somatic dysfunction of thoracic region: Secondary | ICD-10-CM | POA: Diagnosis not present

## 2019-05-27 DIAGNOSIS — M531 Cervicobrachial syndrome: Secondary | ICD-10-CM | POA: Diagnosis not present

## 2019-05-27 DIAGNOSIS — M9901 Segmental and somatic dysfunction of cervical region: Secondary | ICD-10-CM | POA: Diagnosis not present

## 2019-05-27 DIAGNOSIS — M5137 Other intervertebral disc degeneration, lumbosacral region: Secondary | ICD-10-CM | POA: Diagnosis not present

## 2019-06-09 DIAGNOSIS — L720 Epidermal cyst: Secondary | ICD-10-CM | POA: Diagnosis not present

## 2019-06-15 DIAGNOSIS — L03113 Cellulitis of right upper limb: Secondary | ICD-10-CM | POA: Diagnosis not present

## 2019-06-27 ENCOUNTER — Ambulatory Visit: Payer: Medicare HMO | Admitting: Internal Medicine

## 2019-06-28 DIAGNOSIS — M531 Cervicobrachial syndrome: Secondary | ICD-10-CM | POA: Diagnosis not present

## 2019-06-28 DIAGNOSIS — M9902 Segmental and somatic dysfunction of thoracic region: Secondary | ICD-10-CM | POA: Diagnosis not present

## 2019-06-28 DIAGNOSIS — M5137 Other intervertebral disc degeneration, lumbosacral region: Secondary | ICD-10-CM | POA: Diagnosis not present

## 2019-06-28 DIAGNOSIS — M546 Pain in thoracic spine: Secondary | ICD-10-CM | POA: Diagnosis not present

## 2019-06-28 DIAGNOSIS — M9901 Segmental and somatic dysfunction of cervical region: Secondary | ICD-10-CM | POA: Diagnosis not present

## 2019-06-28 DIAGNOSIS — M9903 Segmental and somatic dysfunction of lumbar region: Secondary | ICD-10-CM | POA: Diagnosis not present

## 2019-07-05 ENCOUNTER — Telehealth: Payer: Self-pay

## 2019-07-06 ENCOUNTER — Telehealth: Payer: Medicare HMO | Admitting: Internal Medicine

## 2019-07-06 ENCOUNTER — Other Ambulatory Visit: Payer: Self-pay

## 2019-07-06 DIAGNOSIS — I10 Essential (primary) hypertension: Secondary | ICD-10-CM | POA: Diagnosis not present

## 2019-07-06 DIAGNOSIS — I480 Paroxysmal atrial fibrillation: Secondary | ICD-10-CM | POA: Diagnosis not present

## 2019-07-06 NOTE — Progress Notes (Signed)
Electrophysiology TeleHealth Note   Due to national recommendations of social distancing due to COVID 19, an audio/video telehealth visit is felt to be most appropriate for this patient at this time.  See MyChart message from today for the patient's consent to telehealth for [facility].  Date:  07/06/2019   ID:  Steven Griffin, DOB [birth year], MRN [id number]  Location: patient's home  Provider location:  [facility]  Evaluation Performed: Follow-up visit  PCP:  Steele Sizer, MD   Electrophysiologist:  Dr Rayann Heman  Chief Complaint:  palpitations  History of Present Illness:    Steven Griffin is a 74 y.o. male who presents via telehealth conferencing today.  Since last being seen in our clinic, the patient reports doing very well.  He had COVID back in November but has recovered. Today, he denies symptoms of palpitations, chest pain, shortness of breath,  lower extremity edema, dizziness, presyncope, or syncope.  The patient is otherwise without complaint today.  The patient denies symptoms of fevers, chills, cough, or new SOB worrisome for COVID 19.  Past Medical History:  Diagnosis Date  . Allergic rhinitis, cause unspecified   . Atrial fibrillation ([place])   . CHF (congestive heart failure) ([place])   . COPD (chronic obstructive pulmonary disease) ([place])    pt denies  . Diverticulosis of colon (without mention of hemorrhage)   . Essential hypertension, benign    pt denies  . Hyperlipidemia   . Impotence of organic origin   . Mild anxiety   . Personal history of covid-19   . Skin cancer   . Unspecified venous (peripheral) insufficiency     Past Surgical History:  Procedure Laterality Date  . ATRIAL FIBRILLATION ABLATION N/A 03/03/2017   Procedure: Atrial Fibrillation Ablation;  Surgeon: [name], MD;  Location: [facility];  Service: Cardiovascular;  Laterality: N/A;  . COLONOSCOPY WITH PROPOFOL N/A 11/20/2014   Procedure: COLONOSCOPY WITH PROPOFOL;   Surgeon: Manya Silvas, MD;  Location: [facility] ENDOSCOPY;  Service: Endoscopy;  Laterality: N/A;  . rib cartilage     benign tumor  . SKIN CANCER EXCISION Right 06/2010   lower leg  . TONSILLECTOMY AND ADENOIDECTOMY    . VEIN LIGATION AND STRIPPING      Current Outpatient Medications  Medication Sig Dispense Refill  . 5-Hydroxytryptophan (5-HTP) 100 MG CAPS Take 100 mg by mouth at bedtime.     Marland Kitchen albuterol (VENTOLIN HFA) 108 (90 Base) MCG/ACT inhaler Inhale 2 puffs into the lungs every 4 (four) hours as needed for wheezing or shortness of breath. 8 g 1  . aspirin EC 81 MG tablet Take 81 mg by mouth daily.    Marland Kitchen b complex vitamins capsule Take 1 capsule by mouth daily.    . Cholecalciferol (VITAMIN D3 PO) Take 1 capsule by mouth daily.    . Coenzyme Q10 (CO Q 10) 100 MG CAPS Take 100 mg by mouth daily.    . Digestive Enzymes (DIGESTIVE ENZYME PO) Take 1 capsule by mouth 3 (three) times daily.     Marland Kitchen diltiazem (CARDIZEM) 30 MG tablet Take 30 mg by mouth 4 (four) times daily as needed (for heart).     . diphenhydrAMINE (BENADRYL) 25 MG tablet Take 25 mg by mouth at bedtime as needed.    . finasteride (PROSCAR) 5 MG tablet Take 1 tablet (5 mg total) by mouth daily. 90 tablet 3  . Flaxseed, Linseed, (FLAXSEED OIL PO) Take 1 each by mouth See  admin instructions. Take 15 ml by mouth daily    . Homeopathic Products (LEG CRAMP RELIEF PO) Take 2 tablets by mouth at bedtime.    Marland Kitchen L-Theanine 100 MG CAPS Take 100 mg by mouth 2 (two) times daily.    Marland Kitchen MAGNESIUM PO Take 300 mg by mouth at bedtime.     . Melatonin 3 MG CAPS Take 3 mg by mouth at bedtime.     . Menaquinone-7 (VITAMIN K2 PO) Take 1 tablet by mouth daily.    . Misc Natural Products (BETA-SITOSTEROL PLANT STEROLS) CAPS Take 1 capsule by mouth daily.    . Misc Natural Products (GLUCOSAMINE CHOND MSM FORMULA PO) Take 2 capsules by mouth 2 (two) times daily.    . Multiple Vitamin (MULTIVITAMIN) tablet Take 1 tablet by mouth daily.    . NON  FORMULARY Take 1 tablet by mouth daily. AMPK Supplement    . NON FORMULARY Take 1,000 mg by mouth 2 (two) times daily after a meal. CLA 1000 mg Supplement    . Omega-3 Fatty Acids (FISH OIL) 1000 MG CAPS Take 1,000 mg by mouth daily.     Marland Kitchen OVER THE COUNTER MEDICATION Take 1 capsule by mouth daily. Bacopa Supplement    . OVER THE COUNTER MEDICATION Take 2 tablets by mouth daily. Carb Shredder Supplement    . Polyvinyl Alcohol-Povidone (REFRESH OP) Place 2 drops into both eyes as needed (for dry eyes).    . Probiotic Product (PROBIOTIC DAILY PO) Take 1 capsule by mouth daily.     . sildenafil (REVATIO) 20 MG tablet Take 1-5 tablets (20-100 mg total) by mouth daily as needed (for ED). 20 tablet 2  . tamsulosin (FLOMAX) 0.4 MG CAPS capsule Take 1 capsule (0.4 mg total) by mouth daily. 90 capsule 3  . TURMERIC CURCUMIN PO Take 900 mg by mouth daily.    Marland Kitchen zinc gluconate 50 MG tablet Take 50 mg by mouth daily.     No current facility-administered medications for this visit.    Allergies:   Patient has no known allergies.   Social History:  The patient  reports that he has been smoking cigarettes and cigars. He has a 35.00 pack-year smoking history. He has never used smokeless tobacco. He reports current alcohol use of about 1.0 standard drinks of alcohol per week. He reports that he does not use drugs.   ROS:  Please see the history of present illness.   All other systems are personally reviewed and negative.    Exam:    Vital Signs:  There were no vitals taken for this visit. 128/73 two days ago Well sounding and appearing, alert and conversant, regular work of breathing,  good skin color Eyes- anicteric, neuro- grossly intact, skin- no apparent rash or lesions or cyanosis, mouth- oral mucosa is pink  Labs/Other Tests and Data Reviewed:    Recent Labs: 09/02/2018: ALT 19; BUN 20; Creatinine 0.9; Potassium 4.5; Sodium 140   Wt Readings from Last 3 Encounters:  05/20/19 165 lb (74.8 kg)    05/20/19 165 lb (74.8 kg)  03/29/19 165 lb (74.8 kg)     ASSESSMENT & PLAN:    1.  Paroxysmal atrial fibrillation Doing very well post ablation chads2vasc score is 2.  He prefers to not take anticoagulation at this time  2. HTN Stable No change required today  Follow-up:  12 months with me in office   Patient Risk:  after full review of this patients clinical status, I feel that  they are at moderate risk at this time.  Today, I have spent 15 minutes with the patient with telehealth technology discussing arrhythmia management .    SignedThompson Grayer, MD  07/06/2019 2:50 PM     [address] [place] [address] [phone] (office) [phone] (fax)

## 2019-07-18 ENCOUNTER — Other Ambulatory Visit: Payer: Self-pay

## 2019-07-18 ENCOUNTER — Encounter: Payer: Self-pay | Admitting: Family Medicine

## 2019-07-18 ENCOUNTER — Ambulatory Visit (INDEPENDENT_AMBULATORY_CARE_PROVIDER_SITE_OTHER): Payer: Medicare HMO | Admitting: Family Medicine

## 2019-07-18 VITALS — BP 120/76 | HR 67 | Temp 97.9°F | Resp 16 | Ht 70.0 in | Wt 170.0 lb

## 2019-07-18 DIAGNOSIS — Z23 Encounter for immunization: Secondary | ICD-10-CM | POA: Diagnosis not present

## 2019-07-18 DIAGNOSIS — Z0000 Encounter for general adult medical examination without abnormal findings: Secondary | ICD-10-CM | POA: Diagnosis not present

## 2019-07-18 MED ORDER — SHINGRIX 50 MCG/0.5ML IM SUSR: 0.5000 mL | Freq: Once | INTRAMUSCULAR | 1 refills | Status: AC

## 2019-07-18 NOTE — Patient Instructions (Signed)

## 2019-07-18 NOTE — Progress Notes (Signed)
Name: Steven Griffin   MRN: [id number]    DOB: [birth year]   Date:07/18/2019       Progress Note  Subjective  Chief Complaint  Chief Complaint  Patient presents with  . Annual Exam    HPI  Patient presents for annual CPE   USPSTF grade A and B recommendations:  Diet: balanced Exercise: continue 150 minutes per week   Depression: phq 9 is negative Depression screen [facility] 2/9 05/20/2019 05/20/2019 03/29/2019 11/15/2018 05/14/2018  Decreased Interest 0 0 0 0 0  Down, Depressed, Hopeless 0 0 0 0 0  PHQ - 2 Score 0 0 0 0 0  Altered sleeping 0 - 0 0 -  Tired, decreased energy 0 - 0 0 -  Change in appetite 0 - 0 0 -  Feeling bad or failure about yourself  0 - 0 0 -  Trouble concentrating 0 - 0 1 -  Moving slowly or fidgety/restless 0 - 0 0 -  Suicidal thoughts 0 - 0 0 -  PHQ-9 Score 0 - 0 1 -  Difficult doing work/chores Not difficult at all - Not difficult at all Not difficult at all -    Hypertension:  BP Readings from Last 3 Encounters:  07/18/19 120/76  05/20/19 133/81  05/20/19 133/81    Obesity: Wt Readings from Last 3 Encounters:  07/18/19 170 lb (77.1 kg)  05/20/19 165 lb (74.8 kg)  05/20/19 165 lb (74.8 kg)   BMI Readings from Last 3 Encounters:  07/18/19 24.39 kg/m  05/20/19 23.68 kg/m  05/20/19 23.68 kg/m     Lipids:  Lab Results  Component Value Date   CHOL 185 09/02/2018   CHOL 205 (A) 10/07/2017   CHOL 151 12/26/2016   Lab Results  Component Value Date   HDL 55 09/02/2018   HDL 52 10/07/2017   HDL 52 12/26/2016   Lab Results  Component Value Date   LDLCALC 108 09/02/2018   LDLCALC 120 10/07/2017   LDLCALC 76 12/26/2016   Lab Results  Component Value Date   TRIG 112 09/02/2018   TRIG 166 (A) 10/07/2017   TRIG 113 12/26/2016   Lab Results  Component Value Date   CHOLHDL 3.0 08/31/2015   No results found for: LDLDIRECT Glucose:  Glucose  Date Value Ref Range Status  02/20/2017 92 65 - 99 mg/dL Final  12/18/2016 85 65 - 99 mg/dL  Final  08/31/2015 107 (H) 65 - 99 mg/dL Final      Office Visit from 03/29/2019 in [facility]  AUDIT-C Score  0       Married STD testing and prevention (HIV/chl/gon/syphilis): not interested  Hep C: 2017   Skin cancer: Discussed monitoring for atypical lesions Colorectal cancer: repeat is due  11/19/2024 Prostate cancer: he states last level was 2.4  Lab Results  Component Value Date   PSA 2.2 09/02/2018   PSA 2.3 04/30/2018   PSA 3.1 10/07/2017    IPSS Questionnaire (AUA-7):sees Urologist   Lung cancer:   Low Dose CT Chest recommended if Age 86-80 years, 30 pack-year currently smoking OR have quit w/in 15years. Patient is due for repeat 10/2019  AAA: The USPSTF recommends one-time screening with ultrasonography in men ages 51 to 82 years who have ever smoked ECG:  Done in 10/2017 by Cardiologist - Dr. Rayann Heman   Advanced Care Planning: A voluntary discussion about advance care planning including the explanation and discussion of advance directives.  Discussed health care proxy and Living will,  and the patient was able to identify a health care proxy as wife .  Patient does have a living will at present time. If patient does have living will, I have requested they bring this to the clinic to be scanned in to their chart.  Patient Active Problem List   Diagnosis Date Noted  . Personal history of tobacco use, presenting hazards to health 11/25/2018  . Essential hypertension, benign 02/23/2018  . Superficial thrombophlebitis 02/23/2018  . Phlebitis of left lower extremity 01/26/2018  . Paroxysmal atrial fibrillation ([place]) 03/03/2017  . Bradycardia 08/31/2015  . Muscle tear 04/20/2015  . Allergic rhinitis, seasonal 04/20/2015  . Chronic venous insufficiency 10/27/2014  . Intermittent tremor 10/27/2014  . ED (erectile dysfunction) of non-organic origin 10/27/2014  . Colon, diverticulosis 10/27/2014  . Benign prostatic hypertrophy without urinary  obstruction 10/27/2014  . Bundle branch block, right and left anterior fascicular 06/07/2013  . Atrial fibrillation ([employer]) 10/20/2012  . Obstructive apnea 03/23/2012  . Polypharmacy 04/15/2011  . Hypercholesteremia 01/18/2011  . H/O squamous cell carcinoma of skin 01/18/2011  . History of major vascular surgery 01/18/2011    Past Surgical History:  Procedure Laterality Date  . ATRIAL FIBRILLATION ABLATION N/A 03/03/2017   Procedure: Atrial Fibrillation Ablation;  Surgeon: [name], MD;  Location: [facility];  Service: Cardiovascular;  Laterality: N/A;  . COLONOSCOPY WITH PROPOFOL N/A 11/20/2014   Procedure: COLONOSCOPY WITH PROPOFOL;  Surgeon: Manya Silvas, MD;  Location[facility] ENDOSCOPY;  Service: Endoscopy;  Laterality: N/A;  . rib cartilage     benign tumor  . SKIN CANCER EXCISION Right 06/2010   lower leg  . TONSILLECTOMY AND ADENOIDECTOMY    . VEIN LIGATION AND STRIPPING      Family History  Problem Relation Age of Onset  . Suicidality Mother   . Leukemia Father   . Hypercholesterolemia Father   . Hypertension Brother   . Hyperlipidemia Brother   . Cirrhosis Brother   . Colon cancer Maternal Grandmother     Social History   Socioeconomic History  . Marital status: Married    Spouse name: Neoma Laming  . Number of children: 3  . Years of education: 50  . Highest education level: Not on file  Occupational History  . Occupation: Product manager  Tobacco Use  . Smoking status: Current Every Day Smoker    Packs/day: 1.00    Years: 35.00    Pack years: 35.00    Types: Cigarettes, Cigars  . Smokeless tobacco: Never Used  . Tobacco comment: patient uses natural tobacco (rolls his own)  Substance and Sexual Activity  . Alcohol use: Yes    Alcohol/week: 1.0 standard drinks    Types: 1 Glasses of wine per week    Comment: 2-3 glasses of wine at night  . Drug use: No  . Sexual activity: Yes    Partners: Female    Birth control/protection: None   Other Topics Concern  . Not on file  Social History Narrative   Pt lives in [place] with spouse.  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   Social Determinants of Health   Financial Resource Strain: Low Risk   . Difficulty of Paying Living Expenses: Not very hard  Food Insecurity: No Food Insecurity  . Worried About Charity fundraiser in the Last Year: Never true  . Ran Out of Food in the Last Year: Never true  Transportation Needs: No Transportation Needs  . Lack of  Transportation (Medical): No  . Lack of Transportation (Non-Medical): No  Physical Activity: Sufficiently Active  . Days of Exercise per Week: 6 days  . Minutes of Exercise per Session: 60 min  Stress: No Stress Concern Present  . Feeling of Stress : Not at all  Social Connections: Not Isolated  . Frequency of Communication with Friends and Family: More than three times a week  . Frequency of Social Gatherings with Friends and Family: Twice a week  . Attends Religious Services: More than 4 times per year  . Active Member of Clubs or Organizations: Yes  . Attends Archivist Meetings: 1 to 4 times per year  . Marital Status: Married  Human resources officer Violence: Not At Risk  . Fear of Current or Ex-Partner: No  . Emotionally Abused: No  . Physically Abused: No  . Sexually Abused: No     Current Outpatient Medications:  .  5-Hydroxytryptophan (5-HTP) 100 MG CAPS, Take 100 mg by mouth at bedtime. , Disp: , Rfl:  .  albuterol (VENTOLIN HFA) 108 (90 Base) MCG/ACT inhaler, Inhale 2 puffs into the lungs every 4 (four) hours as needed for wheezing or shortness of breath., Disp: 8 g, Rfl: 1 .  aspirin EC 81 MG tablet, Take 81 mg by mouth daily., Disp: , Rfl:  .  b complex vitamins capsule, Take 1 capsule by mouth daily., Disp: , Rfl:  .  Cholecalciferol (VITAMIN D3 PO), Take 1 capsule by mouth daily., Disp: , Rfl:  .  Coenzyme Q10 (CO Q 10) 100 MG CAPS, Take 100 mg by mouth daily., Disp: ,  Rfl:  .  Digestive Enzymes (DIGESTIVE ENZYME PO), Take 1 capsule by mouth 3 (three) times daily. , Disp: , Rfl:  .  diltiazem (CARDIZEM) 30 MG tablet, Take 30 mg by mouth 4 (four) times daily as needed (for heart). , Disp: , Rfl:  .  diphenhydrAMINE (BENADRYL) 25 MG tablet, Take 25 mg by mouth at bedtime as needed., Disp: , Rfl:  .  finasteride (PROSCAR) 5 MG tablet, Take 1 tablet (5 mg total) by mouth daily., Disp: 90 tablet, Rfl: 3 .  Flaxseed, Linseed, (FLAXSEED OIL PO), Take 1 each by mouth See admin instructions. Take 15 ml by mouth daily, Disp: , Rfl:  .  Homeopathic Products (LEG CRAMP RELIEF PO), Take 2 tablets by mouth at bedtime., Disp: , Rfl:  .  L-Theanine 100 MG CAPS, Take 100 mg by mouth 2 (two) times daily., Disp: , Rfl:  .  MAGNESIUM PO, Take 300 mg by mouth at bedtime. , Disp: , Rfl:  .  Melatonin 3 MG CAPS, Take 3 mg by mouth at bedtime. , Disp: , Rfl:  .  Menaquinone-7 (VITAMIN K2 PO), Take 1 tablet by mouth daily., Disp: , Rfl:  .  Misc Natural Products (BETA-SITOSTEROL PLANT STEROLS) CAPS, Take 1 capsule by mouth daily., Disp: , Rfl:  .  Misc Natural Products (GLUCOSAMINE CHOND MSM FORMULA PO), Take 2 capsules by mouth 2 (two) times daily., Disp: , Rfl:  .  Multiple Vitamin (MULTIVITAMIN) tablet, Take 1 tablet by mouth daily., Disp: , Rfl:  .  NON FORMULARY, Take 1 tablet by mouth daily. AMPK Supplement, Disp: , Rfl:  .  NON FORMULARY, Take 1,000 mg by mouth 2 (two) times daily after a meal. CLA 1000 mg Supplement, Disp: , Rfl:  .  Omega-3 Fatty Acids (FISH OIL) 1000 MG CAPS, Take 1,000 mg by mouth daily. , Disp: , Rfl:  .  OVER THE COUNTER MEDICATION, Take 1 capsule by mouth daily. Bacopa Supplement, Disp: , Rfl:  .  OVER THE COUNTER MEDICATION, Take 2 tablets by mouth daily. Carb Shredder Supplement, Disp: , Rfl:  .  Polyvinyl Alcohol-Povidone (REFRESH OP), Place 2 drops into both eyes as needed (for dry eyes)., Disp: , Rfl:  .  Probiotic Product (PROBIOTIC DAILY PO), Take  1 capsule by mouth daily. , Disp: , Rfl:  .  sildenafil (REVATIO) 20 MG tablet, Take 1-5 tablets (20-100 mg total) by mouth daily as needed (for ED)., Disp: 20 tablet, Rfl: 2 .  tamsulosin (FLOMAX) 0.4 MG CAPS capsule, Take 1 capsule (0.4 mg total) by mouth daily., Disp: 90 capsule, Rfl: 3 .  TURMERIC CURCUMIN PO, Take 900 mg by mouth daily., Disp: , Rfl:  .  zinc gluconate 50 MG tablet, Take 50 mg by mouth daily., Disp: , Rfl:   No Known Allergies   ROS  Constitutional: Negative for fever or weight change.  Respiratory: Negative for cough and shortness of breath.   Cardiovascular: Negative for chest pain or palpitations.  Gastrointestinal: Negative for abdominal pain, no bowel changes.  Musculoskeletal: Negative for gait problem or joint swelling.  Skin: Negative for rash.  Neurological: Negative for dizziness or headache.  No other specific complaints in a complete review of systems (except as listed in HPI above).  Objective  Vitals:   07/18/19 1009  BP: 120/76  Pulse: 67  Resp: 16  Temp: 97.9 F (36.6 C)  TempSrc: Temporal  SpO2: 97%  Weight: 170 lb (77.1 kg)  Height: 5\' 10"  (1.778 m)    Body mass index is 24.39 kg/m.  Physical Exam  Constitutional: Patient appears well-developed and well-nourished. No distress.  HENT: Head: Normocephalic and atraumatic. Ears: B TMs ok, no erythema or effusion; Nose: not done. Mouth/Throat: Not done Eyes: Conjunctivae and EOM are normal. Pupils are equal, round, and reactive to light. No scleral icterus.  Neck: Normal range of motion. Neck supple. No JVD present. No thyromegaly present.  Cardiovascular: Normal rate, regular rhythm and normal heart sounds.  No murmur heard. No BLE edema. Pulmonary/Chest: Effort normal and breath sounds normal. No respiratory distress. Abdominal: Soft. Bowel sounds are normal, no distension. There is no tenderness. no masses MALE GENITALIA: Not done - sees Urologist - Dr. Karsten Ro  RECTAL: seen by  Urologist -  Musculoskeletal: Normal range of motion, no joint effusions. No gross deformities Neurological: he is alert and oriented to person, place, and time. No cranial nerve deficit. Coordination, balance, strength, speech and gait are normal.  Skin: Skin is warm and dry. Very thin, some AK Psychiatric: Patient has a normal mood and affect. behavior is normal. Judgment and thought content normal.  PHQ2/9: Depression screen [facility] 2/9 05/20/2019 05/20/2019 03/29/2019 11/15/2018 05/14/2018  Decreased Interest 0 0 0 0 0  Down, Depressed, Hopeless 0 0 0 0 0  PHQ - 2 Score 0 0 0 0 0  Altered sleeping 0 - 0 0 -  Tired, decreased energy 0 - 0 0 -  Change in appetite 0 - 0 0 -  Feeling bad or failure about yourself  0 - 0 0 -  Trouble concentrating 0 - 0 1 -  Moving slowly or fidgety/restless 0 - 0 0 -  Suicidal thoughts 0 - 0 0 -  PHQ-9 Score 0 - 0 1 -  Difficult doing work/chores Not difficult at all - Not difficult at all Not difficult at all -    Fall  Risk: Fall Risk  07/18/2019 05/20/2019 03/29/2019 11/15/2018 05/14/2018  Falls in the past year? 0 0 0 0 0  Comment - - - - -  Number falls in past yr: 0 0 0 0 0  Injury with Fall? 0 0 0 0 -  Risk for fall due to : - No Fall Risks - - -  Follow up - Falls prevention discussed - - Falls prevention discussed     Functional Status Survey: Is the patient deaf or have difficulty hearing?: No Does the patient have difficulty seeing, even when wearing glasses/contacts?: No Does the patient have difficulty concentrating, remembering, or making decisions?: No Does the patient have difficulty walking or climbing stairs?: No Does the patient have difficulty dressing or bathing?: No Does the patient have difficulty doing errands alone such as visiting a doctor's office or shopping?: No   Assessment & Plan  1. Well adult exam  He will send me a copy of his recent labs   2. Need for shingles vaccine  - Zoster Vaccine Adjuvanted [facility])  injection; Inject 0.5 mLs into the muscle once for 1 dose.  Dispense: 0.5 mL; Refill: 1  -Prostate cancer screening and PSA options (with potential risks and benefits of testing vs not testing) were discussed along with recent recs/guidelines. -USPSTF grade A and B recommendations reviewed with patient; age-appropriate recommendations, preventive care, screening tests, etc discussed and encouraged; healthy living encouraged; see AVS for patient education given to patient -Discussed importance of 150 minutes of physical activity weekly, eat two servings of fish weekly, eat one serving of tree nuts ( cashews, pistachios, pecans, almonds.Marland Kitchen) every other day, eat 6 servings of fruit/vegetables daily and drink plenty of water and avoid sweet beverages.

## 2019-07-20 DIAGNOSIS — L578 Other skin changes due to chronic exposure to nonionizing radiation: Secondary | ICD-10-CM | POA: Diagnosis not present

## 2019-07-20 DIAGNOSIS — Z859 Personal history of malignant neoplasm, unspecified: Secondary | ICD-10-CM | POA: Diagnosis not present

## 2019-07-20 DIAGNOSIS — Z872 Personal history of diseases of the skin and subcutaneous tissue: Secondary | ICD-10-CM | POA: Diagnosis not present

## 2019-07-28 DIAGNOSIS — M5137 Other intervertebral disc degeneration, lumbosacral region: Secondary | ICD-10-CM | POA: Diagnosis not present

## 2019-07-28 DIAGNOSIS — M9902 Segmental and somatic dysfunction of thoracic region: Secondary | ICD-10-CM | POA: Diagnosis not present

## 2019-07-28 DIAGNOSIS — M531 Cervicobrachial syndrome: Secondary | ICD-10-CM | POA: Diagnosis not present

## 2019-07-28 DIAGNOSIS — M9903 Segmental and somatic dysfunction of lumbar region: Secondary | ICD-10-CM | POA: Diagnosis not present

## 2019-07-28 DIAGNOSIS — M9901 Segmental and somatic dysfunction of cervical region: Secondary | ICD-10-CM | POA: Diagnosis not present

## 2019-07-28 DIAGNOSIS — M546 Pain in thoracic spine: Secondary | ICD-10-CM | POA: Diagnosis not present

## 2019-09-12 DIAGNOSIS — M531 Cervicobrachial syndrome: Secondary | ICD-10-CM | POA: Diagnosis not present

## 2019-09-12 DIAGNOSIS — M5137 Other intervertebral disc degeneration, lumbosacral region: Secondary | ICD-10-CM | POA: Diagnosis not present

## 2019-09-12 DIAGNOSIS — M9903 Segmental and somatic dysfunction of lumbar region: Secondary | ICD-10-CM | POA: Diagnosis not present

## 2019-09-12 DIAGNOSIS — M9902 Segmental and somatic dysfunction of thoracic region: Secondary | ICD-10-CM | POA: Diagnosis not present

## 2019-09-12 DIAGNOSIS — M546 Pain in thoracic spine: Secondary | ICD-10-CM | POA: Diagnosis not present

## 2019-09-12 DIAGNOSIS — M9901 Segmental and somatic dysfunction of cervical region: Secondary | ICD-10-CM | POA: Diagnosis not present

## 2019-09-14 LAB — LIPID PANEL
Cholesterol: 204 — AB (ref 0–200)
HDL: 63 (ref 35–70)
LDL Cholesterol: 118
Triglycerides: 131 (ref 40–160)

## 2019-09-14 LAB — BASIC METABOLIC PANEL
BUN: 16 (ref 4–21)
Chloride: 104 (ref 99–108)
Creatinine: 0.9 (ref 0.6–1.3)
Glucose: 92
Potassium: 4.5 (ref 3.4–5.3)
Sodium: 142 (ref 137–147)

## 2019-09-14 LAB — CBC AND DIFFERENTIAL
HCT: 48 (ref 41–53)
Hemoglobin: 16.4 (ref 13.5–17.5)
Platelets: 297 (ref 150–399)
WBC: 4.9

## 2019-09-14 LAB — HEMOGLOBIN A1C: Hemoglobin A1C: 5.4

## 2019-09-14 LAB — COMPREHENSIVE METABOLIC PANEL
Albumin: 4.5 (ref 3.5–5.0)
Calcium: 9.6 (ref 8.7–10.7)
GFR calc Af Amer: 96
GFR calc non Af Amer: 83
Globulin: 2.3

## 2019-09-14 LAB — TESTOSTERONE: Testosterone: 690

## 2019-09-14 LAB — TSH: TSH: 2.28 (ref 0.41–5.90)

## 2019-09-14 LAB — HEPATIC FUNCTION PANEL
ALT: 21 (ref 10–40)
AST: 22 (ref 14–40)
Alkaline Phosphatase: 76 (ref 25–125)

## 2019-09-14 LAB — CBC: RBC: 5.35 — AB (ref 3.87–5.11)

## 2019-09-14 LAB — PSA: PSA: 2.3

## 2019-09-14 LAB — VITAMIN D 25 HYDROXY (VIT D DEFICIENCY, FRACTURES): Vit D, 25-Hydroxy: 65.4

## 2019-10-03 DIAGNOSIS — M9902 Segmental and somatic dysfunction of thoracic region: Secondary | ICD-10-CM | POA: Diagnosis not present

## 2019-10-03 DIAGNOSIS — M531 Cervicobrachial syndrome: Secondary | ICD-10-CM | POA: Diagnosis not present

## 2019-10-03 DIAGNOSIS — M546 Pain in thoracic spine: Secondary | ICD-10-CM | POA: Diagnosis not present

## 2019-10-03 DIAGNOSIS — M9901 Segmental and somatic dysfunction of cervical region: Secondary | ICD-10-CM | POA: Diagnosis not present

## 2019-10-03 DIAGNOSIS — M9903 Segmental and somatic dysfunction of lumbar region: Secondary | ICD-10-CM | POA: Diagnosis not present

## 2019-10-03 DIAGNOSIS — M5137 Other intervertebral disc degeneration, lumbosacral region: Secondary | ICD-10-CM | POA: Diagnosis not present

## 2019-10-13 ENCOUNTER — Telehealth: Payer: Self-pay | Admitting: Family Medicine

## 2019-10-13 NOTE — Chronic Care Management (AMB) (Signed)
°  Chronic Care Management   Outreach Note  10/13/2019 Name: JAYDEN KRATOCHVIL MRN: [id number] DOB: Jan 17, 1946  LACHARLES ALTSCHULER is a 74 y.o. year old male who is a primary care patient of Steele Sizer, MD. I reached out to [name] by phone today in response to a referral sent by Mr. ELGAR SCOGGINS health plan.     An unsuccessful telephone outreach was attempted today. The patient was referred to the case management team for assistance with care management and care coordination.   Follow Up Plan: A HIPPA compliant phone message was left for the patient providing contact information and requesting a return call.  The care management team will reach out to the patient again over the next 7 days.  If patient returns call to provider office, please advise to call [facility]  at [place], [place], [place], [place] [address] Direct Dial: [phone] [email or web address]

## 2019-10-17 NOTE — Chronic Care Management (AMB) (Signed)
  Chronic Care Management   Outreach Note  10/17/2019 Name: Steven Griffin MRN: [id number] DOB: 08-09-1945  Steven Griffin is a 74 y.o. year old male who is a primary care patient of Steele Sizer, MD. I reached out to Steven Griffin by phone today in response to a referral sent by Steven Griffin health plan.     A second unsuccessful telephone outreach was attempted today. The patient was referred to the case management team for assistance with care management and care coordination.   Follow Up Plan: A HIPPA compliant phone message was left for the patient providing contact information and requesting a return call.  The care management team will reach out to the patient again over the next 7 days.  If patient returns call to provider office, please advise to call [facility]  at [place], [place], [place], [place] [address] Direct Dial: [phone] [email or web address]: [email or web address]

## 2019-11-01 NOTE — Chronic Care Management (AMB) (Signed)
  Chronic Care Management   Note  11/01/2019 Name: KEITA VALLEY MRN: [id number] DOB: [birth year]  CONNY MOENING is a 74 y.o. year old male who is a primary care patient of Steele Sizer, MD. I reached out to [name] by phone today in response to a referral sent by Mr. JEBEDIAH MACRAE health plan.     Mr. Bautch was given information about Chronic Care Management services today including:  1. CCM service includes personalized support from designated clinical staff supervised by his physician, including individualized plan of care and coordination with other care providers 2. 24/7 contact phone numbers for assistance for urgent and routine care needs. 3. Service will only be billed when office clinical staff spend 20 minutes or more in a month to coordinate care. 4. Only one practitioner may furnish and bill the service in a calendar month. 5. The patient may stop CCM services at any time (effective at the end of the month) by phone call to the office staff. 6. The patient will be responsible for cost sharing (co-pay) of up to 20% of the service fee (after annual deductible is met).  Patient did not agree to enrollment in care management services and does not wish to consider at this time.  Follow up plan: The care management team is available to follow up with the patient after provider conversation with the patient regarding recommendation for care management engagement and subsequent re-referral to the care management team.   Noreene Larsson, [place], [place], [place] [address] Direct Dial: [phone] [email or web address]

## 2019-11-11 ENCOUNTER — Telehealth: Payer: Self-pay

## 2019-11-11 DIAGNOSIS — Z122 Encounter for screening for malignant neoplasm of respiratory organs: Secondary | ICD-10-CM

## 2019-11-11 DIAGNOSIS — Z87891 Personal history of nicotine dependence: Secondary | ICD-10-CM

## 2019-11-11 NOTE — Telephone Encounter (Signed)
Contacted patient to schedule annual lung screening CT scan.  Patient's last scan was 11/25/2018.  Patient agreeable to schedule scan and scan scheduled for August 2 at 10:00 am.  Patient is aware of where to go for scan.  Patient's smoking status remains the same as rolling natural tobacco and not inhaling.  Patient states he has not received COVID vaccine as he had COVID and now has natural immunities.  Patient has no condition that would prevent treatment in case of finding of cancer.

## 2019-11-14 ENCOUNTER — Encounter: Payer: Self-pay | Admitting: Family Medicine

## 2019-11-14 NOTE — Telephone Encounter (Signed)
Smoking history: current, 36 pack year

## 2019-11-14 NOTE — Addendum Note (Signed)
Addended by: Lieutenant Diego on: 11/14/2019 01:59 PM   Modules accepted: Orders

## 2019-11-16 DIAGNOSIS — M546 Pain in thoracic spine: Secondary | ICD-10-CM | POA: Diagnosis not present

## 2019-11-16 DIAGNOSIS — M5137 Other intervertebral disc degeneration, lumbosacral region: Secondary | ICD-10-CM | POA: Diagnosis not present

## 2019-11-16 DIAGNOSIS — M531 Cervicobrachial syndrome: Secondary | ICD-10-CM | POA: Diagnosis not present

## 2019-11-16 DIAGNOSIS — M9902 Segmental and somatic dysfunction of thoracic region: Secondary | ICD-10-CM | POA: Diagnosis not present

## 2019-11-16 DIAGNOSIS — M9901 Segmental and somatic dysfunction of cervical region: Secondary | ICD-10-CM | POA: Diagnosis not present

## 2019-11-16 DIAGNOSIS — M9903 Segmental and somatic dysfunction of lumbar region: Secondary | ICD-10-CM | POA: Diagnosis not present

## 2019-11-17 ENCOUNTER — Ambulatory Visit: Payer: Medicare HMO | Admitting: Family Medicine

## 2019-11-21 ENCOUNTER — Encounter: Payer: Self-pay | Admitting: Family Medicine

## 2019-11-28 ENCOUNTER — Other Ambulatory Visit: Payer: Self-pay

## 2019-11-28 ENCOUNTER — Ambulatory Visit
Admission: RE | Admit: 2019-11-28 | Discharge: 2019-11-28 | Disposition: A | Discharge status: Home / Self Care | Payer: Medicare HMO | Source: Ambulatory Visit | Attending: Oncology | Admitting: Oncology

## 2019-11-28 DIAGNOSIS — Z122 Encounter for screening for malignant neoplasm of respiratory organs: Secondary | ICD-10-CM

## 2019-11-28 DIAGNOSIS — Z87891 Personal history of nicotine dependence: Secondary | ICD-10-CM | POA: Diagnosis not present

## 2019-11-28 DIAGNOSIS — R69 Illness, unspecified: Secondary | ICD-10-CM | POA: Diagnosis not present

## 2019-11-30 ENCOUNTER — Encounter: Payer: Self-pay | Admitting: *Deleted

## 2019-11-30 ENCOUNTER — Other Ambulatory Visit: Payer: Self-pay | Admitting: *Deleted

## 2019-11-30 DIAGNOSIS — Z122 Encounter for screening for malignant neoplasm of respiratory organs: Secondary | ICD-10-CM

## 2019-11-30 DIAGNOSIS — Z87891 Personal history of nicotine dependence: Secondary | ICD-10-CM

## 2019-12-13 ENCOUNTER — Other Ambulatory Visit: Payer: Self-pay

## 2019-12-13 ENCOUNTER — Encounter: Payer: Self-pay | Admitting: Family Medicine

## 2019-12-13 ENCOUNTER — Ambulatory Visit (INDEPENDENT_AMBULATORY_CARE_PROVIDER_SITE_OTHER): Payer: Medicare HMO | Admitting: Family Medicine

## 2019-12-13 VITALS — BP 122/62 | HR 65 | Temp 98.0°F | Resp 16 | Ht 70.0 in | Wt 167.0 lb

## 2019-12-13 DIAGNOSIS — N138 Other obstructive and reflux uropathy: Secondary | ICD-10-CM | POA: Diagnosis not present

## 2019-12-13 DIAGNOSIS — I2510 Atherosclerotic heart disease of native coronary artery without angina pectoris: Secondary | ICD-10-CM | POA: Diagnosis not present

## 2019-12-13 DIAGNOSIS — D692 Other nonthrombocytopenic purpura: Secondary | ICD-10-CM

## 2019-12-13 DIAGNOSIS — E7800 Pure hypercholesterolemia, unspecified: Secondary | ICD-10-CM

## 2019-12-13 DIAGNOSIS — I872 Venous insufficiency (chronic) (peripheral): Secondary | ICD-10-CM | POA: Diagnosis not present

## 2019-12-13 DIAGNOSIS — I700 Atherosclerosis of aorta: Secondary | ICD-10-CM | POA: Diagnosis not present

## 2019-12-13 DIAGNOSIS — N529 Male erectile dysfunction, unspecified: Secondary | ICD-10-CM

## 2019-12-13 DIAGNOSIS — J432 Centrilobular emphysema: Secondary | ICD-10-CM

## 2019-12-13 DIAGNOSIS — N401 Enlarged prostate with lower urinary tract symptoms: Secondary | ICD-10-CM | POA: Diagnosis not present

## 2019-12-13 DIAGNOSIS — I480 Paroxysmal atrial fibrillation: Secondary | ICD-10-CM | POA: Diagnosis not present

## 2019-12-13 MED ORDER — TAMSULOSIN HCL 0.4 MG PO CAPS: 0.4000 mg | ORAL_CAPSULE | Freq: Every day | ORAL | 3 refills | Status: DC

## 2019-12-13 NOTE — Progress Notes (Signed)
Name: Steven Griffin   MRN: [id number]    DOB: December 31, 1945   Date:12/13/2019       Progress Note  Subjective  Chief Complaint  Chief Complaint  Patient presents with  . Atrial Fibrillation  . Hyperlipidemia    HPI   Hyperlipidemia: he does not want to take statin therapy, last LDL down to 118 done in May 2021, he had high Apo B and small LDL   BPH:he used to seeDr. Verne Spurr since 2019 he switched provider and is now going to [facility] in [place], he brought a copy of his last PSA  2.29 Aug 2018, repeat May 2021 also 2.3 . He is onFinasteride and Tamsulosin. He also takes generic viagra prn.   Afib with  AV node ablation:Nov 2018, doing well , denies chest pain, palpitation, or decrease in exercise tolerance off medication since ablation, and CHADS2VASC score is 2, but not on anticoagulation.   Chronic venous insufficiency: seeing Dr. Lucky Cowboy, last visit 03/2018, he has a history of phlebitis but not recurrence since first episode in 2019. He denies pain or edema, doing well at this time   Cognitive changes:  CIT-6 was done and normal, he states he does not feel as sharp as he used to, he has been taking more notes to not forget to do things. He states symptoms started after COVID-19 infection Nov 2020 He states he is back to normal.   Emphysema : he had COVID-17 Mar 2019 but no problems since. He  states he is back to his baseline, no cough, wheezing or SOB.  He states fatigue has resolved also , he is feeling well   Aorta Atherosclerosis: : he continues to refuse statin therapy, he is on a healthy diet  , he is on aspirin daily  He also has atherosclerosis of Left anterior descending coronary. Discussed CT results with patient today   Patient Active Problem List   Diagnosis Date Noted  . Coronary artery disease involving native coronary artery of native heart without angina pectoris 12/13/2019  . Atherosclerosis of aorta ([place]) 12/13/2019  . Senile purpura ([place])  12/13/2019  . Personal history of tobacco use, presenting hazards to health 11/25/2018  . Essential hypertension, benign 02/23/2018  . Superficial thrombophlebitis 02/23/2018  . Phlebitis of left lower extremity 01/26/2018  . Paroxysmal atrial fibrillation ([place]) 03/03/2017  . Bradycardia 08/31/2015  . Muscle tear 04/20/2015  . Allergic rhinitis, seasonal 04/20/2015  . Chronic venous insufficiency 10/27/2014  . Intermittent tremor 10/27/2014  . ED (erectile dysfunction) of non-organic origin 10/27/2014  . Colon, diverticulosis 10/27/2014  . Benign prostatic hypertrophy without urinary obstruction 10/27/2014  . Bundle branch block, right and left anterior fascicular 06/07/2013  . Atrial fibrillation ([place]) 10/20/2012  . Obstructive apnea 03/23/2012  . Polypharmacy 04/15/2011  . On dofetilide therapy 04/15/2011  . Hypercholesteremia 01/18/2011  . H/O squamous cell carcinoma of skin 01/18/2011  . History of major vascular surgery 01/18/2011    Past Surgical History:  Procedure Laterality Date  . ATRIAL FIBRILLATION ABLATION N/A 03/03/2017   Procedure: Atrial Fibrillation Ablation;  Surgeon: [name], MD;  Location: [facility];  Service: Cardiovascular;  Laterality: N/A;  . COLONOSCOPY WITH PROPOFOL N/A 11/20/2014   Procedure: COLONOSCOPY WITH PROPOFOL;  Surgeon: Manya Silvas, MD;  Location: [facility] ENDOSCOPY;  Service: Endoscopy;  Laterality: N/A;  . rib cartilage     benign tumor  . SKIN CANCER EXCISION Right 06/2010   lower leg  . TONSILLECTOMY AND ADENOIDECTOMY    .  VEIN LIGATION AND STRIPPING      Family History  Problem Relation Age of Onset  . Suicidality Mother   . Leukemia Father   . Hypercholesterolemia Father   . Hypertension Brother   . Hyperlipidemia Brother   . Cirrhosis Brother   . Colon cancer Maternal Grandmother     Social History   Tobacco Use  . Smoking status: Current Every Day Smoker    Packs/day: 1.00    Years: 35.00    Pack years:  35.00    Types: Cigarettes, Cigars  . Smokeless tobacco: Never Used  . Tobacco comment: patient uses natural tobacco (rolls his own)  Substance Use Topics  . Alcohol use: Yes    Alcohol/week: 1.0 standard drink    Types: 1 Glasses of wine per week    Comment: 2-3 glasses of wine at night     Current Outpatient Medications:  .  5-Hydroxytryptophan (5-HTP) 100 MG CAPS, Take 100 mg by mouth at bedtime. , Disp: , Rfl:  .  aspirin EC 81 MG tablet, Take 81 mg by mouth daily., Disp: , Rfl:  .  b complex vitamins capsule, Take 1 capsule by mouth daily., Disp: , Rfl:  .  Cholecalciferol (VITAMIN D3 PO), Take 1 capsule by mouth daily., Disp: , Rfl:  .  Coenzyme Q10 (CO Q 10) 100 MG CAPS, Take 100 mg by mouth daily., Disp: , Rfl:  .  Digestive Enzymes (DIGESTIVE ENZYME PO), Take 1 capsule by mouth 3 (three) times daily. , Disp: , Rfl:  .  diltiazem (CARDIZEM) 30 MG tablet, Take 30 mg by mouth 4 (four) times daily as needed (for heart). , Disp: , Rfl:  .  diphenhydrAMINE (BENADRYL) 25 MG tablet, Take 25 mg by mouth at bedtime as needed., Disp: , Rfl:  .  finasteride (PROSCAR) 5 MG tablet, Take 1 tablet (5 mg total) by mouth daily., Disp: 90 tablet, Rfl: 3 .  Flaxseed, Linseed, (FLAXSEED OIL PO), Take 1 each by mouth See admin instructions. Take 15 ml by mouth daily, Disp: , Rfl:  .  Homeopathic Products (LEG CRAMP RELIEF PO), Take 2 tablets by mouth at bedtime., Disp: , Rfl:  .  L-Theanine 100 MG CAPS, Take 100 mg by mouth 2 (two) times daily., Disp: , Rfl:  .  MAGNESIUM PO, Take 300 mg by mouth at bedtime. , Disp: , Rfl:  .  Melatonin 3 MG CAPS, Take 3 mg by mouth at bedtime. , Disp: , Rfl:  .  Menaquinone-7 (VITAMIN K2 PO), Take 1 tablet by mouth daily., Disp: , Rfl:  .  Misc Natural Products (BETA-SITOSTEROL PLANT STEROLS) CAPS, Take 1 capsule by mouth daily., Disp: , Rfl:  .  Multiple Vitamin (MULTIVITAMIN) tablet, Take 1 tablet by mouth daily., Disp: , Rfl:  .  NON FORMULARY, Take 1 tablet by  mouth daily. AMPK Supplement, Disp: , Rfl:  .  Omega-3 Fatty Acids (FISH OIL) 1000 MG CAPS, Take 1,000 mg by mouth daily. , Disp: , Rfl:  .  OVER THE COUNTER MEDICATION, Take 1 capsule by mouth daily. Bacopa Supplement, Disp: , Rfl:  .  Polyvinyl Alcohol-Povidone (REFRESH OP), Place 2 drops into both eyes as needed (for dry eyes)., Disp: , Rfl:  .  Probiotic Product (PROBIOTIC DAILY PO), Take 1 capsule by mouth daily. , Disp: , Rfl:  .  sildenafil (REVATIO) 20 MG tablet, Take 1-5 tablets (20-100 mg total) by mouth daily as needed (for ED)., Disp: 20 tablet, Rfl: 2 .  tamsulosin (FLOMAX) 0.4 MG CAPS capsule, Take 1 capsule (0.4 mg total) by mouth daily., Disp: 90 capsule, Rfl: 3 .  zinc gluconate 50 MG tablet, Take 50 mg by mouth daily., Disp: , Rfl:  .  albuterol (VENTOLIN HFA) 108 (90 Base) MCG/ACT inhaler, Inhale 2 puffs into the lungs every 4 (four) hours as needed for wheezing or shortness of breath. (Patient not taking: Reported on 12/13/2019), Disp: 8 g, Rfl: 1 .  Misc Natural Products (GLUCOSAMINE CHOND MSM FORMULA PO), Take 2 capsules by mouth 2 (two) times daily., Disp: , Rfl:  .  NON FORMULARY, Take 1,000 mg by mouth 2 (two) times daily after a meal. CLA 1000 mg Supplement (Patient not taking: Reported on 12/13/2019), Disp: , Rfl:  .  OVER THE COUNTER MEDICATION, Take 2 tablets by mouth daily. Carb Shredder Supplement (Patient not taking: Reported on 12/13/2019), Disp: , Rfl:  .  TURMERIC CURCUMIN PO, Take 900 mg by mouth daily. (Patient not taking: Reported on 12/13/2019), Disp: , Rfl:   No Known Allergies  I personally reviewed active problem list, medication list, allergies, family history, social history, health maintenance, notes from last encounter with the patient/caregiver today.   ROS  Constitutional: Negative for fever or weight change.  Respiratory: Negative for cough and shortness of breath.   Cardiovascular: Negative for chest pain or palpitations.  Gastrointestinal:  Negative for abdominal pain, no bowel changes.  Musculoskeletal: Negative for gait problem or joint swelling.  Skin: Negative for rash.  Neurological: Negative for dizziness or headache.  No other specific complaints in a complete review of systems (except as listed in HPI above).  Objective  Vitals:   12/13/19 1420  BP: 122/62  Pulse: 65  Resp: 16  Temp: 98 F (36.7 C)  TempSrc: Oral  SpO2: 98%  Weight: 167 lb (75.8 kg)  Height: 5\' 10"  (1.778 m)    Body mass index is 23.96 kg/m.  Physical Exam  Constitutional: Patient appears well-developed and well-nourished. No distress.  HEENT: head atraumatic, normocephalic, pupils equal and reactive to light,  neck supple Cardiovascular: Normal rate, regular rhythm and normal heart sounds.  No murmur heard. No BLE edema. Pulmonary/Chest: Effort normal and breath sounds normal. No respiratory distress. Abdominal: Soft.  There is no tenderness. Psychiatric: Patient has a normal mood and affect. behavior is normal. Judgment and thought content normal.  PHQ2/9: Depression screen [facility] 2/9 07/18/2019 05/20/2019 05/20/2019 03/29/2019 11/15/2018  Decreased Interest 0 0 0 0 0  Down, Depressed, Hopeless 0 0 0 0 0  PHQ - 2 Score 0 0 0 0 0  Altered sleeping 0 0 - 0 0  Tired, decreased energy 1 0 - 0 0  Change in appetite 0 0 - 0 0  Feeling bad or failure about yourself  0 0 - 0 0  Trouble concentrating 0 0 - 0 1  Moving slowly or fidgety/restless 0 0 - 0 0  Suicidal thoughts 0 0 - 0 0  PHQ-9 Score 1 0 - 0 1  Difficult doing work/chores Not difficult at all Not difficult at all - Not difficult at all Not difficult at all    phq 9 is negative   Fall Risk: Fall Risk  07/18/2019 05/20/2019 03/29/2019 11/15/2018 05/14/2018  Falls in the past year? 0 0 0 0 0  Comment - - - - -  Number falls in past yr: 0 0 0 0 0  Injury with Fall? 0 0 0 0 -  Risk for fall due to : -  No Fall Risks - - -  Follow up - Falls prevention discussed - - Falls prevention  discussed     Assessment & Plan  1. Paroxysmal atrial fibrillation ([facility])  Under the care of Dr. Rayann Heman   2. Centrilobular emphysema ([place])  He does not want to take any maintenance inhaler   3. Senile purpura ([facility])  Stable   4. Hypercholesteremia  Refuses statin therapy   5. Chronic venous insufficiency  stable  6. Atherosclerosis of aorta ([facility])  Not on statin, by choice   7. Coronary artery disease involving native coronary artery of native heart without angina pectoris  Discussed statin therapy , taking aspirin 325 mg a few times a week   8. Benign prostatic hyperplasia with urinary obstruction  - tamsulosin (FLOMAX) 0.4 MG CAPS capsule; Take 1 capsule (0.4 mg total) by mouth daily.  Dispense: 90 capsule; Refill: 3  9. Erectile dysfunction, unspecified erectile dysfunction type  Stable, taking sildenafil prn

## 2019-12-15 DIAGNOSIS — M9903 Segmental and somatic dysfunction of lumbar region: Secondary | ICD-10-CM | POA: Diagnosis not present

## 2019-12-15 DIAGNOSIS — M531 Cervicobrachial syndrome: Secondary | ICD-10-CM | POA: Diagnosis not present

## 2019-12-15 DIAGNOSIS — M9901 Segmental and somatic dysfunction of cervical region: Secondary | ICD-10-CM | POA: Diagnosis not present

## 2019-12-15 DIAGNOSIS — M5137 Other intervertebral disc degeneration, lumbosacral region: Secondary | ICD-10-CM | POA: Diagnosis not present

## 2019-12-15 DIAGNOSIS — M9902 Segmental and somatic dysfunction of thoracic region: Secondary | ICD-10-CM | POA: Diagnosis not present

## 2019-12-15 DIAGNOSIS — M546 Pain in thoracic spine: Secondary | ICD-10-CM | POA: Diagnosis not present

## 2020-01-19 DIAGNOSIS — M5137 Other intervertebral disc degeneration, lumbosacral region: Secondary | ICD-10-CM | POA: Diagnosis not present

## 2020-01-19 DIAGNOSIS — M531 Cervicobrachial syndrome: Secondary | ICD-10-CM | POA: Diagnosis not present

## 2020-01-19 DIAGNOSIS — M9902 Segmental and somatic dysfunction of thoracic region: Secondary | ICD-10-CM | POA: Diagnosis not present

## 2020-01-19 DIAGNOSIS — M9901 Segmental and somatic dysfunction of cervical region: Secondary | ICD-10-CM | POA: Diagnosis not present

## 2020-01-19 DIAGNOSIS — M546 Pain in thoracic spine: Secondary | ICD-10-CM | POA: Diagnosis not present

## 2020-01-19 DIAGNOSIS — M9903 Segmental and somatic dysfunction of lumbar region: Secondary | ICD-10-CM | POA: Diagnosis not present

## 2020-01-20 ENCOUNTER — Other Ambulatory Visit: Payer: Self-pay | Admitting: Family Medicine

## 2020-01-20 DIAGNOSIS — N529 Male erectile dysfunction, unspecified: Secondary | ICD-10-CM

## 2020-02-14 ENCOUNTER — Encounter: Payer: Self-pay | Admitting: Family Medicine

## 2020-03-07 DIAGNOSIS — M546 Pain in thoracic spine: Secondary | ICD-10-CM | POA: Diagnosis not present

## 2020-03-07 DIAGNOSIS — M531 Cervicobrachial syndrome: Secondary | ICD-10-CM | POA: Diagnosis not present

## 2020-03-07 DIAGNOSIS — M9902 Segmental and somatic dysfunction of thoracic region: Secondary | ICD-10-CM | POA: Diagnosis not present

## 2020-03-07 DIAGNOSIS — M5137 Other intervertebral disc degeneration, lumbosacral region: Secondary | ICD-10-CM | POA: Diagnosis not present

## 2020-03-07 DIAGNOSIS — M9903 Segmental and somatic dysfunction of lumbar region: Secondary | ICD-10-CM | POA: Diagnosis not present

## 2020-03-07 DIAGNOSIS — M9901 Segmental and somatic dysfunction of cervical region: Secondary | ICD-10-CM | POA: Diagnosis not present

## 2020-05-09 ENCOUNTER — Encounter: Payer: Self-pay | Admitting: Family Medicine

## 2020-05-22 DIAGNOSIS — M5137 Other intervertebral disc degeneration, lumbosacral region: Secondary | ICD-10-CM | POA: Diagnosis not present

## 2020-05-22 DIAGNOSIS — M546 Pain in thoracic spine: Secondary | ICD-10-CM | POA: Diagnosis not present

## 2020-05-22 DIAGNOSIS — M9903 Segmental and somatic dysfunction of lumbar region: Secondary | ICD-10-CM | POA: Diagnosis not present

## 2020-05-22 DIAGNOSIS — M9901 Segmental and somatic dysfunction of cervical region: Secondary | ICD-10-CM | POA: Diagnosis not present

## 2020-05-22 DIAGNOSIS — M531 Cervicobrachial syndrome: Secondary | ICD-10-CM | POA: Diagnosis not present

## 2020-05-22 DIAGNOSIS — M9902 Segmental and somatic dysfunction of thoracic region: Secondary | ICD-10-CM | POA: Diagnosis not present

## 2020-05-24 ENCOUNTER — Ambulatory Visit: Payer: Medicare HMO

## 2020-05-30 ENCOUNTER — Encounter: Payer: Self-pay | Admitting: Family Medicine

## 2020-05-30 DIAGNOSIS — N138 Other obstructive and reflux uropathy: Secondary | ICD-10-CM

## 2020-05-30 MED ORDER — FINASTERIDE 5 MG PO TABS: 5.0000 mg | ORAL_TABLET | Freq: Every day | ORAL | 3 refills | Status: DC

## 2020-06-07 ENCOUNTER — Ambulatory Visit (INDEPENDENT_AMBULATORY_CARE_PROVIDER_SITE_OTHER): Payer: Medicare HMO

## 2020-06-07 DIAGNOSIS — Z0000 Encounter for general adult medical examination without abnormal findings: Secondary | ICD-10-CM

## 2020-06-07 NOTE — Patient Instructions (Signed)
[name] , Thank you for taking time to come for your Medicare Wellness Visit. I appreciate your ongoing commitment to your health goals. Please review the following plan we discussed and let me know if I can assist you in the future.   Screening recommendations/referrals: Colonoscopy: done 11/20/14 Recommended yearly ophthalmology/optometry visit for glaucoma screening and checkup Recommended yearly dental visit for hygiene and checkup  Vaccinations: Influenza vaccine: due Pneumococcal vaccine: done 08/28/14 Tdap vaccine: done 06/17/11 Shingles vaccine: Shingrix discussed. Please contact your pharmacy for coverage information.  Covid-19: declined  Conditions/risks identified: Keep up the great work!  Next appointment: Follow up in one year for your annual wellness visit.   Preventive Care 74 Years and Older, Male Preventive care refers to lifestyle choices and visits with your health care provider that can promote health and wellness. What does preventive care include?  A yearly physical exam. This is also called an annual well check.  Dental exams once or twice a year.  Routine eye exams. Ask your health care provider how often you should have your eyes checked.  Personal lifestyle choices, including:  Daily care of your teeth and gums.  Regular physical activity.  Eating a healthy diet.  Avoiding tobacco and drug use.  Limiting alcohol use.  Practicing safe sex.  Taking low doses of aspirin every day.  Taking vitamin and mineral supplements as recommended by your health care provider. What happens during an annual well check? The services and screenings done by your health care provider during your annual well check will depend on your age, overall health, lifestyle risk factors, and family history of disease. Counseling  Your health care provider may ask you questions about your:  Alcohol use.  Tobacco use.  Drug use.  Emotional well-being.  Home and  relationship well-being.  Sexual activity.  Eating habits.  History of falls.  Memory and ability to understand (cognition).  Work and work Statistician. Screening  You may have the following tests or measurements:  Height, weight, and BMI.  Blood pressure.  Lipid and cholesterol levels. These may be checked every 5 years, or more frequently if you are over 7 years old.  Skin check.  Lung cancer screening. You may have this screening every year starting at age 55 if you have a 30-pack-year history of smoking and currently smoke or have quit within the past 15 years.  Fecal occult blood test (FOBT) of the stool. You may have this test every year starting at age 37.  Flexible sigmoidoscopy or colonoscopy. You may have a sigmoidoscopy every 5 years or a colonoscopy every 10 years starting at age 82.  Prostate cancer screening. Recommendations will vary depending on your family history and other risks.  Hepatitis C blood test.  Hepatitis B blood test.  Sexually transmitted disease (STD) testing.  Diabetes screening. This is done by checking your blood sugar (glucose) after you have not eaten for a while (fasting). You may have this done every 1-3 years.  Abdominal aortic aneurysm (AAA) screening. You may need this if you are a current or former smoker.  Osteoporosis. You may be screened starting at age 79 if you are at high risk. Talk with your health care provider about your test results, treatment options, and if necessary, the need for more tests. Vaccines  Your health care provider may recommend certain vaccines, such as:  Influenza vaccine. This is recommended every year.  Tetanus, diphtheria, and acellular pertussis (Tdap, Td) vaccine. You may need a Td  booster every 10 years.  Zoster vaccine. You may need this after age 53.  Pneumococcal 13-valent conjugate (PCV13) vaccine. One dose is recommended after age 27.  Pneumococcal polysaccharide (PPSV23) vaccine. One  dose is recommended after age 21. Talk to your health care provider about which screenings and vaccines you need and how often you need them. This information is not intended to replace advice given to you by your health care provider. Make sure you discuss any questions you have with your health care provider. Document Released: 05/11/2015 Document Revised: 01/02/2016 Document Reviewed: 02/13/2015 Elsevier Interactive Patient Education  2017 [place] Prevention in the Home Falls can cause injuries. They can happen to people of all ages. There are many things you can do to make your home safe and to help prevent falls. What can I do on the outside of my home?  Regularly fix the edges of walkways and driveways and fix any cracks.  Remove anything that might make you trip as you walk through a door, such as a raised step or threshold.  Trim any bushes or trees on the path to your home.  Use bright outdoor lighting.  Clear any walking paths of anything that might make someone trip, such as rocks or tools.  Regularly check to see if handrails are loose or broken. Make sure that both sides of any steps have handrails.  Any raised decks and porches should have guardrails on the edges.  Have any leaves, snow, or ice cleared regularly.  Use sand or salt on walking paths during winter.  Clean up any spills in your garage right away. This includes oil or grease spills. What can I do in the bathroom?  Use night lights.  Install grab bars by the toilet and in the tub and shower. Do not use towel bars as grab bars.  Use non-skid mats or decals in the tub or shower.  If you need to sit down in the shower, use a plastic, non-slip stool.  Keep the floor dry. Clean up any water that spills on the floor as soon as it happens.  Remove soap buildup in the tub or shower regularly.  Attach bath mats securely with double-sided non-slip rug tape.  Do not have throw rugs and other  things on the floor that can make you trip. What can I do in the bedroom?  Use night lights.  Make sure that you have a light by your bed that is easy to reach.  Do not use any sheets or blankets that are too big for your bed. They should not hang down onto the floor.  Have a firm chair that has side arms. You can use this for support while you get dressed.  Do not have throw rugs and other things on the floor that can make you trip. What can I do in the kitchen?  Clean up any spills right away.  Avoid walking on wet floors.  Keep items that you use a lot in easy-to-reach places.  If you need to reach something above you, use a strong step stool that has a grab bar.  Keep electrical cords out of the way.  Do not use floor polish or wax that makes floors slippery. If you must use wax, use non-skid floor wax.  Do not have throw rugs and other things on the floor that can make you trip. What can I do with my stairs?  Do not leave any items on the stairs.  Make sure that there are handrails on both sides of the stairs and use them. Fix handrails that are broken or loose. Make sure that handrails are as long as the stairways.  Check any carpeting to make sure that it is firmly attached to the stairs. Fix any carpet that is loose or worn.  Avoid having throw rugs at the top or bottom of the stairs. If you do have throw rugs, attach them to the floor with carpet tape.  Make sure that you have a light switch at the top of the stairs and the bottom of the stairs. If you do not have them, ask someone to add them for you. What else can I do to help prevent falls?  Wear shoes that:  Do not have high heels.  Have rubber bottoms.  Are comfortable and fit you well.  Are closed at the toe. Do not wear sandals.  If you use a stepladder:  Make sure that it is fully opened. Do not climb a closed stepladder.  Make sure that both sides of the stepladder are locked into place.  Ask  someone to hold it for you, if possible.  Clearly mark and make sure that you can see:  Any grab bars or handrails.  First and last steps.  Where the edge of each step is.  Use tools that help you move around (mobility aids) if they are needed. These include:  Canes.  Walkers.  Scooters.  Crutches.  Turn on the lights when you go into a dark area. Replace any light bulbs as soon as they burn out.  Set up your furniture so you have a clear path. Avoid moving your furniture around.  If any of your floors are uneven, fix them.  If there are any pets around you, be aware of where they are.  Review your medicines with your doctor. Some medicines can make you feel dizzy. This can increase your chance of falling. Ask your doctor what other things that you can do to help prevent falls. This information is not intended to replace advice given to you by your health care provider. Make sure you discuss any questions you have with your health care provider. Document Released: 02/08/2009 Document Revised: 09/20/2015 Document Reviewed: 05/19/2014 Elsevier Interactive Patient Education  2017 [employer].

## 2020-06-07 NOTE — Progress Notes (Signed)
Subjective:   Steven Griffin is a 75 y.o. male who presents for Medicare Annual/Subsequent preventive examination.  Virtual Visit via Telephone Note  I connected with  Steven Griffin on 06/07/20 at 10:00 AM EST by telephone and verified that I am speaking with the correct person using two identifiers.  Location: Patient: home Provider: [place] Persons participating in the virtual visit: Steven Griffin   I discussed the limitations, risks, security and privacy concerns of performing an evaluation and management service by telephone and the availability of in person appointments. The patient expressed understanding and agreed to proceed.  Interactive audio and video telecommunications were attempted between this nurse and patient, however failed, due to patient having technical difficulties OR patient did not have access to video capability.  We continued and completed visit with audio only.  Some vital signs may be absent or patient reported.   Steven Marker, LPN    Review of Systems     Cardiac Risk Factors include: advanced age (>74men, >88 women);smoking/ tobacco exposure;male gender;hypertension     Objective:    There were no vitals filed for this visit. There is no height or weight on file to calculate BMI.  Advanced Directives 06/07/2020 05/20/2019 03/21/2019 05/14/2018 03/03/2017 09/02/2016 08/05/2016  Does Patient Have a Medical Advance Directive? Yes Yes Yes Yes No Yes Yes  Type of Paramedic of [place];Living will [place];Living will [place];Living will [place];Living will - [place];Living will [place];Living will  Does patient want to make changes to medical advance directive? - - - - - - -  Copy of [place] in Chart? Yes - validated most recent copy scanned in chart (See row information) No - copy requested - No -  copy requested - No - copy requested No - copy requested  Would patient like information on creating a medical advance directive? - - - - No - Patient declined - -    Current Medications (verified) Outpatient Encounter Medications as of 06/07/2020  Medication Sig  . 5-Hydroxytryptophan (5-HTP) 100 MG CAPS Take 100 mg by mouth at bedtime.   Marland Kitchen aspirin EC 81 MG tablet Take 81 mg by mouth daily.  . Cholecalciferol (VITAMIN D3 PO) Take 1 capsule by mouth daily.  . Coenzyme Q10 (CO Q 10) 100 MG CAPS Take 100 mg by mouth daily.  . Digestive Enzymes (DIGESTIVE ENZYME PO) Take 1 capsule by mouth 3 (three) times daily.   . diphenhydrAMINE (BENADRYL) 25 MG tablet Take 25 mg by mouth at bedtime as needed.  . finasteride (PROSCAR) 5 MG tablet Take 1 tablet (5 mg total) by mouth daily.  . Flaxseed, Linseed, (FLAXSEED OIL PO) Take 1 each by mouth See admin instructions. Take 15 ml by mouth daily  . Green Tea, Camellia sinensis, (GREEN TEA EXTRACT PO) Take by mouth.  . Homeopathic Products (LEG CRAMP RELIEF PO) Take 2 tablets by mouth at bedtime.  Marland Kitchen L-Theanine 100 MG CAPS Take 100 mg by mouth 2 (two) times daily.  Marland Kitchen LITHIUM PO Take 1,000 mcg by mouth. From life extension foundation  . MAGNESIUM PO Take 300 mg by mouth at bedtime.   . Melatonin 3 MG CAPS Take 3 mg by mouth at bedtime.   . Menaquinone-7 (VITAMIN K2 PO) Take 1 tablet by mouth daily.  . Misc Natural Products (BETA-SITOSTEROL PLANT STEROLS) CAPS Take 1 capsule by mouth daily.  . Misc  Natural Products (GLUCOSAMINE CHOND MSM FORMULA PO) Take 2 capsules by mouth 2 (two) times daily.  . Multiple Vitamin (MULTIVITAMIN) tablet Take 1 tablet by mouth daily.  . NON FORMULARY Take 1 tablet by mouth daily. AMPK Supplement  . Omega-3 Fatty Acids (FISH OIL) 1000 MG CAPS Take 1,000 mg by mouth daily.   Marland Kitchen OVER THE COUNTER MEDICATION Take 1 capsule by mouth daily. Bacopa Supplement  . OVER THE COUNTER MEDICATION Neuromag  . OVER THE COUNTER MEDICATION  astaxanthim  . OVER THE COUNTER MEDICATION Macuguard for eyes  . Polyvinyl Alcohol-Povidone (REFRESH OP) Place 2 drops into both eyes as needed (for dry eyes).  . Probiotic Product (PROBIOTIC DAILY PO) Take 1 capsule by mouth daily.   . sildenafil (REVATIO) 20 MG tablet TAKE 1-5 TABLETS (20-100 MG TOTAL) BY MOUTH DAILY AS NEEDED (FOR ED).  Marland Kitchen tamsulosin (FLOMAX) 0.4 MG CAPS capsule Take 1 capsule (0.4 mg total) by mouth daily.  . Turmeric (CURCUMIN 95) 500 MG CAPS Take by mouth.  . zinc gluconate 50 MG tablet Take 50 mg by mouth daily.  . [DISCONTINUED] albuterol (VENTOLIN HFA) 108 (90 Base) MCG/ACT inhaler Inhale 2 puffs into the lungs every 4 (four) hours as needed for wheezing or shortness of breath. (Patient not taking: Reported on 12/13/2019)  . [DISCONTINUED] b complex vitamins capsule Take 1 capsule by mouth daily.  . [DISCONTINUED] diltiazem (CARDIZEM) 30 MG tablet Take 30 mg by mouth 4 (four) times daily as needed (for heart).    No facility-administered encounter medications on file as of 06/07/2020.    Allergies (verified) Patient has no known allergies.   History: Past Medical History:  Diagnosis Date  . Allergic rhinitis, cause unspecified   . Atrial fibrillation ([place])   . CHF (congestive heart failure) ([place])   . COPD (chronic obstructive pulmonary disease) ([place])    pt denies  . Diverticulosis of colon (without mention of hemorrhage)   . Essential hypertension, benign    pt denies  . Hyperlipidemia   . Impotence of organic origin   . Mild anxiety   . Personal history of COVID-19   . Skin cancer   . Unspecified venous (peripheral) insufficiency    Past Surgical History:  Procedure Laterality Date  . ATRIAL FIBRILLATION ABLATION N/A 03/03/2017   Procedure: Atrial Fibrillation Ablation;  Surgeon: [name], MD;  Location: [facility];  Service: Cardiovascular;  Laterality: N/A;  . COLONOSCOPY WITH PROPOFOL N/A 11/20/2014   Procedure: COLONOSCOPY WITH PROPOFOL;   Surgeon: Manya Silvas, MD;  [facility];  Service: Endoscopy;  Laterality: N/A;  . rib cartilage     benign tumor  . SKIN CANCER EXCISION Right 06/2010   lower leg  . TONSILLECTOMY AND ADENOIDECTOMY    . VEIN LIGATION AND STRIPPING     Family History  Problem Relation Age of Onset  . Suicidality Mother   . Leukemia Father   . Hypercholesterolemia Father   . Hypertension Brother   . Hyperlipidemia Brother   . Cirrhosis Brother   . Colon cancer Maternal Grandmother    Social History   Socioeconomic History  . Marital status: Married    Spouse name: Neoma Laming  . Number of children: 3  . Years of education: 45  . Highest education level: Not on file  Occupational History  . Occupation: Product manager  Tobacco Use  . Smoking status: Current Every Day Smoker    Packs/day: 1.00    Years: 35.00  Pack years: 35.00    Types: Cigarettes, Cigars  . Smokeless tobacco: Never Used  . Tobacco comment: patient uses natural tobacco (rolls his own)  Vaping Use  . Vaping Use: Never used  Substance and Sexual Activity  . Alcohol use: Yes    Alcohol/week: 1.0 standard drink    Types: 1 Glasses of wine per week    Comment: 2-3 glasses of wine at night  . Drug use: No  . Sexual activity: Yes    Partners: Female    Birth control/protection: None  Other Topics Concern  . Not on file  Social History Narrative   Pt lives in [place] with spouse.  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   Social Determinants of Health   Financial Resource Strain: Low Risk   . Difficulty of Paying Living Expenses: Not hard at all  Food Insecurity: No Food Insecurity  . Worried About Charity fundraiser in the Last Year: Never true  . Ran Out of Food in the Last Year: Never true  Transportation Needs: No Transportation Needs  . Lack of Transportation (Medical): No  . Lack of Transportation (Non-Medical): No  Physical Activity: Sufficiently Active  . Days  of Exercise per Week: 6 days  . Minutes of Exercise per Session: 50 min  Stress: No Stress Concern Present  . Feeling of Stress : Not at all  Social Connections: Socially Integrated  . Frequency of Communication with Friends and Family: More than three times a week  . Frequency of Social Gatherings with Friends and Family: Three times a week  . Attends Religious Services: More than 4 times per year  . Active Member of Clubs or Organizations: Yes  . Attends Archivist Meetings: 1 to 4 times per year  . Marital Status: Married    Tobacco Counseling Ready to quit: Not Answered Counseling given: Not Answered Comment: patient uses natural tobacco (rolls his own)   Clinical Intake:  Pre-visit preparation completed: Yes  Pain : No/denies pain     Nutritional Risks: None Diabetes: No  How often do you need to have someone help you when you read instructions, pamphlets, or other written materials from your doctor or pharmacy?: 1 - Never    Interpreter Needed?: No  Information entered by :: Steven Marker LPN   Activities of Daily Living In your present state of health, do you have any difficulty performing the following activities: 06/07/2020 07/18/2019  Hearing? N N  Comment declines hearing aids -  Vision? N N  Difficulty concentrating or making decisions? N N  Walking or climbing stairs? N N  Dressing or bathing? N N  Doing errands, shopping? N N  Preparing Food and eating ? N -  Using the Toilet? N -  In the past six months, have you accidently leaked urine? N -  Do you have problems with loss of bowel control? N -  Managing your Medications? N -  Managing your Finances? N -  Housekeeping or managing your Housekeeping? N -  Some recent data might be hidden    Patient Care Team: Steele Sizer, MD as PCP - General (Family Medicine) Wellington Hampshire, MD as Consulting Physician (Cardiology) [name], MD as Consulting Physician (Cardiology) Kathie Rhodes, MD (Inactive) as Consulting Physician (Urology) Jannet Mantis, MD (Dermatology)  Indicate any recent Medical Services you may have received from other than [facility] providers in the past year (date may be approximate).  Assessment:   This is a routine wellness examination for [place].  Hearing/Vision screen  Hearing Screening   125Hz  250Hz  500Hz  1000Hz  2000Hz  3000Hz  4000Hz  6000Hz  8000Hz   Right ear:           Left ear:           Comments: Pt denies hearing difficulty   Vision Screening Comments: Vision screenings done by Dr. Mallie Mussel in [place] issues and exercise activities discussed: Current Exercise Habits: Home exercise routine, Type of exercise: treadmill;strength training/weights, Time (Minutes): 50, Frequency (Times/Week): 6, Weekly Exercise (Minutes/Week): 300, Intensity: Moderate, Exercise limited by: None identified  Goals    . Patient Stated     Maintain active and healthy lifestyle      Depression Screen PHQ 2/9 Scores 06/07/2020 07/18/2019 05/20/2019 05/20/2019 03/29/2019 11/15/2018 05/14/2018  PHQ - 2 Score 0 0 0 0 0 0 0  PHQ- 9 Score - 1 0 - 0 1 -    Fall Risk Fall Risk  06/07/2020 07/18/2019 05/20/2019 03/29/2019 11/15/2018  Falls in the past year? 0 0 0 0 0  Comment - - - - -  Number falls in past yr: 0 0 0 0 0  Injury with Fall? 0 0 0 0 0  Risk for fall due to : No Fall Risks - No Fall Risks - -  Follow up Falls prevention discussed - Falls prevention discussed - -    FALL RISK PREVENTION PERTAINING TO THE HOME:  Any stairs in or around the home? Yes  If so, are there any without handrails? No  Home free of loose throw rugs in walkways, pet beds, electrical cords, etc? Yes  Adequate lighting in your home to reduce risk of falls? Yes   ASSISTIVE DEVICES UTILIZED TO PREVENT FALLS:  Life alert? No  Use of a cane, walker or w/c? No  Grab bars in the bathroom? Yes  Shower chair or bench in shower? No  Elevated toilet seat or a handicapped  toilet? Yes   TIMED UP AND GO:  Was the test performed? No . Telephonic visit  Cognitive Function: Normal cognitive status assessed by direct observation by this Nurse Health Advisor. No abnormalities found.       6CIT Screen 05/14/2018  What Year? 0 points  What month? 0 points  What time? 0 points  Count back from 20 0 points  Months in reverse 0 points  Repeat phrase 0 points  Total Score 0    Immunizations Immunization History  Administered Date(s) Administered  . Influenza Split 03/22/2008  . Influenza, High Dose Seasonal PF 03/10/2018, 03/07/2019  . Influenza, Seasonal, Injecte, Preservative Fre 02/27/2012, 07/12/2012  . Influenza-Unspecified 03/23/2015, 04/01/2016, 02/16/2017  . Pneumococcal Conjugate-13 08/28/2014  . Pneumococcal Polysaccharide-23 08/03/2012  . Td 06/17/2011  . Zoster 03/28/2010    TDAP status: Up to date  Flu Vaccine status: Due, Education has been provided regarding the importance of this vaccine. Advised may receive this vaccine at local pharmacy or Health Dept. Aware to provide a copy of the vaccination record if obtained from local pharmacy or Health Dept. Verbalized acceptance and understanding.  Pneumococcal vaccine status: Up to date  Covid-19 vaccine status: Declined, Education has been provided regarding the importance of this vaccine but patient still declined. Advised may receive this vaccine at local pharmacy or Health Dept.or vaccine clinic. Aware to provide a copy of the vaccination record if obtained from local pharmacy or Health Dept. Verbalized acceptance and understanding.  Qualifies for Shingles Vaccine?  Yes   Zostavax completed Yes   Shingrix Completed?: No.    Education has been provided regarding the importance of this vaccine. Patient has been advised to call insurance company to determine out of pocket expense if they have not yet received this vaccine. Advised may also receive vaccine at local pharmacy or Health Dept.  Verbalized acceptance and understanding.  Screening Tests Health Maintenance  Topic Date Due  . INFLUENZA VACCINE  11/27/2019  . COVID-19 Vaccine (1) 06/23/2020 (Originally 06/16/1950)  . TETANUS/TDAP  06/16/2021  . COLONOSCOPY (Pts 45-18yrs Insurance coverage will need to be confirmed)  11/19/2024  . Hepatitis C Screening  Completed  . PNA vac Low Risk Adult  Completed    Health Maintenance  Health Maintenance Due  Topic Date Due  . INFLUENZA VACCINE  11/27/2019     Colorectal cancer screening: Type of screening: Colonoscopy. Completed 11/20/14. Repeat every 10 years  Lung Cancer Screening: (Low Dose CT Chest recommended if Age 63-80 years, 30 pack-year currently smoking OR have quit w/in 15years.) does qualify. Completed 11/28/19  Additional Screening:  Hepatitis C Screening: does qualify; Completed 08/31/15  Vision Screening: Recommended annual ophthalmology exams for early detection of glaucoma and other disorders of the eye. Is the patient up to date with their annual eye exam?  Yes  Who is the provider or what is the name of the office in which the patient attends annual eye exams? Dr. Mallie Mussel   Dental Screening: Recommended annual dental exams for proper oral hygiene  Community Resource Referral / Chronic Care Management: CRR required this visit?  No   CCM required this visit?  No      Plan:     I have personally reviewed and noted the following in the patient's chart:   . Medical and social history . Use of alcohol, tobacco or illicit drugs  . Current medications and supplements . Functional ability and status . Nutritional status . Physical activity . Advanced directives . List of other physicians . Hospitalizations, surgeries, and ER visits in previous 12 months . Vitals . Screenings to include cognitive, depression, and falls . Referrals and appointments  In addition, I have reviewed and discussed with patient certain preventive protocols, quality  metrics, and best practice recommendations. A written personalized care plan for preventive services as well as general preventive health recommendations were provided to patient.     Steven Marker, LPN   1/61/0960   Nurse Notes: none

## 2020-07-02 ENCOUNTER — Ambulatory Visit: Payer: Medicare HMO | Admitting: Internal Medicine

## 2020-07-02 ENCOUNTER — Encounter: Payer: Self-pay | Admitting: Internal Medicine

## 2020-07-02 ENCOUNTER — Other Ambulatory Visit: Payer: Self-pay

## 2020-07-02 VITALS — BP 138/82 | HR 61 | Ht 70.0 in | Wt 174.0 lb

## 2020-07-02 DIAGNOSIS — I10 Essential (primary) hypertension: Secondary | ICD-10-CM | POA: Diagnosis not present

## 2020-07-02 DIAGNOSIS — I480 Paroxysmal atrial fibrillation: Secondary | ICD-10-CM

## 2020-07-02 MED ORDER — DILTIAZEM HCL 30 MG PO TABS: 30.0000 mg | ORAL_TABLET | Freq: Four times a day (QID) | ORAL | 1 refills | Status: DC | PRN

## 2020-07-02 NOTE — Patient Instructions (Addendum)
Medication Instructions:  Your physician has recommended you make the following change in your medication:   1.  TAKE diltiazem 30 mg-  Take 1-2 tablets by mouth as needed every 6 hours.  Labwork: None ordered.  Testing/Procedures: None ordered.  Follow-Up: Your physician wants you to follow-up in: one year with Tommye Standard, PA.   You will receive a reminder letter in the mail two months in advance. If you don't receive a letter, please call our office to schedule the follow-up appointment.   Any Other Special Instructions Will Be Listed Below (If Applicable).  If you need a refill on your cardiac medications before your next appointment, please call your pharmacy.

## 2020-07-02 NOTE — Progress Notes (Signed)
PCP: Steele Sizer, MD   Primary EP: Dr Vella Kohler is a 75 y.o. male who presents today for routine electrophysiology followup.  Since last being seen in our clinic, the patient reports doing very well.  Today, he denies symptoms of palpitations, chest pain, shortness of breath,  lower extremity edema, dizziness, presyncope, or syncope.  The patient is otherwise without complaint today.   Past Medical History:  Diagnosis Date  . Allergic rhinitis, cause unspecified   . Atrial fibrillation ([place])   . CHF (congestive heart failure) ([place])   . COPD (chronic obstructive pulmonary disease) ([place])    pt denies  . Diverticulosis of colon (without mention of hemorrhage)   . Essential hypertension, benign    pt denies  . Hyperlipidemia   . Impotence of organic origin   . Mild anxiety   . Personal history of COVID-19   . Skin cancer   . Unspecified venous (peripheral) insufficiency    Past Surgical History:  Procedure Laterality Date  . ATRIAL FIBRILLATION ABLATION N/A 03/03/2017   Procedure: Atrial Fibrillation Ablation;  Surgeon: [name], MD;  Location: [facility];  Service: Cardiovascular;  Laterality: N/A;  . COLONOSCOPY WITH PROPOFOL N/A 11/20/2014   Procedure: COLONOSCOPY WITH PROPOFOL;  Surgeon: Manya Silvas, MD;  [facility] ENDOSCOPY;  Service: Endoscopy;  Laterality: N/A;  . rib cartilage     benign tumor  . SKIN CANCER EXCISION Right 06/2010   lower leg  . TONSILLECTOMY AND ADENOIDECTOMY    . VEIN LIGATION AND STRIPPING      ROS- all systems are reviewed and negatives except as per HPI above  Current Outpatient Medications  Medication Sig Dispense Refill  . 5-Hydroxytryptophan (5-HTP) 100 MG CAPS Take 100 mg by mouth at bedtime.     Marland Kitchen aspirin EC 81 MG tablet Take 81 mg by mouth daily.    . Cholecalciferol (VITAMIN D3 PO) Take 1 capsule by mouth daily.    . Coenzyme Q10 (CO Q 10) 100 MG CAPS Take 100 mg by mouth daily.    . Digestive  Enzymes (DIGESTIVE ENZYME PO) Take 1 capsule by mouth 3 (three) times daily.     . diphenhydrAMINE (BENADRYL) 25 MG tablet Take 25 mg by mouth at bedtime as needed.    . finasteride (PROSCAR) 5 MG tablet Take 1 tablet (5 mg total) by mouth daily. 90 tablet 3  . Flaxseed, Linseed, (FLAXSEED OIL PO) Take 1 each by mouth See admin instructions. Take 15 ml by mouth daily    . Green Tea, Camellia sinensis, (GREEN TEA EXTRACT PO) Take by mouth.    . Homeopathic Products (LEG CRAMP RELIEF PO) Take 2 tablets by mouth at bedtime.    Marland Kitchen L-Theanine 100 MG CAPS Take 100 mg by mouth 2 (two) times daily.    Marland Kitchen LITHIUM PO Take 1,000 mcg by mouth. From life extension foundation    . MAGNESIUM PO Take 300 mg by mouth at bedtime.     . Melatonin 3 MG CAPS Take 3 mg by mouth at bedtime.     . Menaquinone-7 (VITAMIN K2 PO) Take 1 tablet by mouth daily.    . Misc Natural Products (BETA-SITOSTEROL PLANT STEROLS) CAPS Take 1 capsule by mouth daily.    . Misc Natural Products (GLUCOSAMINE CHOND MSM FORMULA PO) Take 2 capsules by mouth 2 (two) times daily.    . Multiple Vitamin (MULTIVITAMIN) tablet Take 1 tablet by mouth daily.    Marland Kitchen  NON FORMULARY Take 1 tablet by mouth daily. AMPK Supplement    . Omega-3 Fatty Acids (FISH OIL) 1000 MG CAPS Take 1,000 mg by mouth daily.     Marland Kitchen OVER THE COUNTER MEDICATION Take 1 capsule by mouth daily. Bacopa Supplement    . OVER THE COUNTER MEDICATION Neuromag    . OVER THE COUNTER MEDICATION astaxanthim    . OVER THE COUNTER MEDICATION Macuguard for eyes    . Polyvinyl Alcohol-Povidone (REFRESH OP) Place 2 drops into both eyes as needed (for dry eyes).    . Probiotic Product (PROBIOTIC DAILY PO) Take 1 capsule by mouth daily.     . sildenafil (REVATIO) 20 MG tablet TAKE 1-5 TABLETS (20-100 MG TOTAL) BY MOUTH DAILY AS NEEDED (FOR ED). 20 tablet 2  . tamsulosin (FLOMAX) 0.4 MG CAPS capsule Take 1 capsule (0.4 mg total) by mouth daily. 90 capsule 3  . Turmeric 500 MG CAPS Take by mouth.     . zinc gluconate 50 MG tablet Take 50 mg by mouth daily.     No current facility-administered medications for this visit.    Physical Exam: Vitals:   07/02/20 0946  BP: 138/82  Pulse: 61  SpO2: 94%  Weight: 174 lb (78.9 kg)  Height: 5\' 10"  (1.778 m)    GEN- The patient is well appearing, alert and oriented x 3 today.   Head- normocephalic, atraumatic Eyes-  Sclera clear, conjunctiva pink Ears- hearing intact Oropharynx- clear Lungs-   normal work of breathing Heart- Regular rate and rhythm  GI- soft  Extremities- no clubbing, cyanosis, or edema  Wt Readings from Last 3 Encounters:  07/02/20 174 lb (78.9 kg)  12/13/19 167 lb (75.8 kg)  11/28/19 170 lb (77.1 kg)    EKG tracing ordered today is personally reviewed and shows sinus with PACs, RBBB, LAHB (unchanged since at least 2014)  Assessment and Plan:  1. Paroxysmal atrial fibrillation Doing very well post ablation off AAD therapy chads2vasc score is 2.  He wishes to avoid [facility] therapy He has prn diltiazem but does not have to take this very often  2. HTN Stable No change required today   Return in a year to see EP PA  [name] MD, [facility] 07/02/2020 10:08 AM

## 2020-07-16 NOTE — Progress Notes (Signed)
Name: Steven Griffin   MRN: [id number]    DOB: Oct 31, 1945   Date:07/18/2020       Progress Note  Subjective  Chief Complaint  Annual Exam  HPI  Patient presents for annual CPE and follow up   IPSS Questionnaire (AUA-7): Over the past month.   1)  How often have you had a sensation of not emptying your bladder completely after you finish urinating?  0 - Not at all  2)  How often have you had to urinate again less than two hours after you finished urinating? 1 - Less than 1 time in 5  3)  How often have you found you stopped and started again several times when you urinated?  1 - Less than 1 time in 5  4) How difficult have you found it to postpone urination?  1 - Less than 1 time in 5  5) How often have you had a weak urinary stream?  0 - Not at all  6) How often have you had to push or strain to begin urination?  0 - Not at all  7) How many times did you most typically get up to urinate from the time you went to bed until the time you got up in the morning?  0 - None  Total score:  0-7 mildly symptomatic   8-19 moderately symptomatic   20-35 severely symptomatic     Diet: balanced  Exercise:   Depression: phq 9 is negative Depression screen [facility] 2/9 07/18/2020 06/07/2020 07/18/2019 05/20/2019 05/20/2019  Decreased Interest 0 0 0 0 0  Down, Depressed, Hopeless 0 0 0 0 0  PHQ - 2 Score 0 0 0 0 0  Altered sleeping - - 0 0 -  Tired, decreased energy - - 1 0 -  Change in appetite - - 0 0 -  Feeling bad or failure about yourself  - - 0 0 -  Trouble concentrating - - 0 0 -  Moving slowly or fidgety/restless - - 0 0 -  Suicidal thoughts - - 0 0 -  PHQ-9 Score - - 1 0 -  Difficult doing work/chores - - Not difficult at all Not difficult at all -    Hypertension:  BP Readings from Last 3 Encounters:  07/18/20 122/70  07/02/20 138/82  12/13/19 122/62    Obesity: Wt Readings from Last 3 Encounters:  07/18/20 172 lb (78 kg)  07/02/20 174 lb (78.9 kg)  12/13/19 167 lb (75.8 kg)    BMI Readings from Last 3 Encounters:  07/18/20 24.68 kg/m  07/02/20 24.97 kg/m  12/13/19 23.96 kg/m     Lipids:  Lab Results  Component Value Date   CHOL 204 (A) 09/14/2019   CHOL 185 09/02/2018   CHOL 205 (A) 10/07/2017   Lab Results  Component Value Date   HDL 63 09/14/2019   HDL 55 09/02/2018   HDL 52 10/07/2017   Lab Results  Component Value Date   LDLCALC 118 09/14/2019   LDLCALC 108 09/02/2018   LDLCALC 120 10/07/2017   Lab Results  Component Value Date   TRIG 131 09/14/2019   TRIG 112 09/02/2018   TRIG 166 (A) 10/07/2017   Lab Results  Component Value Date   CHOLHDL 3.0 08/31/2015   No results found for: LDLDIRECT Glucose:  Glucose  Date Value Ref Range Status  02/20/2017 92 65 - 99 mg/dL Final  12/18/2016 85 65 - 99 mg/dL Final  08/31/2015 107 (H) 65 - 99 mg/dL  Final    Flowsheet Row Office Visit from 07/18/2020 in [facility]  AUDIT-C Score 4     Married STD testing and prevention (HIV/chl/gon/syphilis): not interested  Hep C: 08/31/15  Skin cancer: Discussed monitoring for atypical lesions Colorectal cancer: 11/20/14 Prostate cancer:  Lab Results  Component Value Date   PSA 2.3 09/14/2019   PSA 2.2 09/02/2018   PSA 2.3 04/30/2018     Lung cancer:last one done 11/2019 , repeat yearly  AAA: done 06/2016  ECG:  07/02/20  Vaccines:  Shingrix: 9-64 yo and ask insurance if covered when patient above 37 yo Pneumonia: educated and discussed with patient. Flu: did not get it 2021 but will get it 2022 season   Advanced Care Planning: A voluntary discussion about advance care planning including the explanation and discussion of advance directives.  Discussed health care proxy and Living will, and the patient was able to identify a health care proxy as wife   Patient does not have a living will at present time. If patient does have living will, I have requested they bring this to the clinic to be scanned in to their  chart.  Patient Active Problem List   Diagnosis Date Noted  . Coronary artery disease involving native coronary artery of native heart without angina pectoris 12/13/2019  . Atherosclerosis of aorta ([place]) 12/13/2019  . Senile purpura ([place]) 12/13/2019  . Personal history of tobacco use, presenting hazards to health 11/25/2018  . Essential hypertension, benign 02/23/2018  . Superficial thrombophlebitis 02/23/2018  . Phlebitis of left lower extremity 01/26/2018  . Paroxysmal atrial fibrillation ([place]) 03/03/2017  . Bradycardia 08/31/2015  . Muscle tear 04/20/2015  . Allergic rhinitis, seasonal 04/20/2015  . Chronic venous insufficiency 10/27/2014  . Intermittent tremor 10/27/2014  . ED (erectile dysfunction) of non-organic origin 10/27/2014  . Colon, diverticulosis 10/27/2014  . Benign prostatic hypertrophy without urinary obstruction 10/27/2014  . Bundle branch block, right and left anterior fascicular 06/07/2013  . Atrial fibrillation ([place]) 10/20/2012  . Obstructive apnea 03/23/2012  . Polypharmacy 04/15/2011  . On dofetilide therapy 04/15/2011  . Hypercholesteremia 01/18/2011  . H/O squamous cell carcinoma of skin 01/18/2011  . History of major vascular surgery 01/18/2011    Past Surgical History:  Procedure Laterality Date  . ATRIAL FIBRILLATION ABLATION N/A 03/03/2017   Procedure: Atrial Fibrillation Ablation;  Surgeon: [name], MD;  Location: [place] [facility];  Service: Cardiovascular;  Laterality: N/A;  . COLONOSCOPY WITH PROPOFOL N/A 11/20/2014   Procedure: COLONOSCOPY WITH PROPOFOL;  Surgeon: Manya Silvas, MD;  Location: [facility] ENDOSCOPY;  Service: Endoscopy;  Laterality: N/A;  . rib cartilage     benign tumor  . SKIN CANCER EXCISION Right 06/2010   lower leg  . TONSILLECTOMY AND ADENOIDECTOMY    . VEIN LIGATION AND STRIPPING      Family History  Problem Relation Age of Onset  . Suicidality Mother   . Leukemia Father   . Hypercholesterolemia Father   .  Hypertension Brother   . Hyperlipidemia Brother   . Cirrhosis Brother   . Colon cancer Maternal Grandmother     Social History   Socioeconomic History  . Marital status: Married    Spouse name: Neoma Laming  . Number of children: 3  . Years of education: 59  . Highest education level: Not on file  Occupational History  . Occupation: Product manager  Tobacco Use  . Smoking status: Current Every Day Smoker    Packs/day: 1.00  Years: 35.00    Pack years: 35.00    Types: Cigarettes, Cigars  . Smokeless tobacco: Never Used  . Tobacco comment: patient uses natural tobacco (rolls his own)  Vaping Use  . Vaping Use: Never used  Substance and Sexual Activity  . Alcohol use: Yes    Alcohol/week: 1.0 standard drink    Types: 1 Glasses of wine per week    Comment: 2-3 glasses of wine at night  . Drug use: No  . Sexual activity: Yes    Partners: Female    Birth control/protection: None  Other Topics Concern  . Not on file  Social History Narrative   Pt lives in [place] with spouse.  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   Social Determinants of Health   Financial Resource Strain: Low Risk   . Difficulty of Paying Living Expenses: Not hard at all  Food Insecurity: No Food Insecurity  . Worried About Charity fundraiser in the Last Year: Never true  . Ran Out of Food in the Last Year: Never true  Transportation Needs: No Transportation Needs  . Lack of Transportation (Medical): No  . Lack of Transportation (Non-Medical): No  Physical Activity: Sufficiently Active  . Days of Exercise per Week: 5 days  . Minutes of Exercise per Session: 60 min  Stress: No Stress Concern Present  . Feeling of Stress : Only a little  Social Connections: Socially Integrated  . Frequency of Communication with Friends and Family: More than three times a week  . Frequency of Social Gatherings with Friends and Family: Twice a week  . Attends Religious Services: More than  4 times per year  . Active Member of Clubs or Organizations: Yes  . Attends Archivist Meetings: Never  . Marital Status: Married  Human resources officer Violence: Not At Risk  . Fear of Current or Ex-Partner: No  . Emotionally Abused: No  . Physically Abused: No  . Sexually Abused: No     Current Outpatient Medications:  .  5-Hydroxytryptophan (5-HTP) 100 MG CAPS, Take 100 mg by mouth at bedtime. , Disp: , Rfl:  .  aspirin EC 81 MG tablet, Take 81 mg by mouth daily., Disp: , Rfl:  .  Cholecalciferol (VITAMIN D3 PO), Take 1 capsule by mouth daily., Disp: , Rfl:  .  Coenzyme Q10 (CO Q 10) 100 MG CAPS, Take 100 mg by mouth daily., Disp: , Rfl:  .  Digestive Enzymes (DIGESTIVE ENZYME PO), Take 1 capsule by mouth 3 (three) times daily. , Disp: , Rfl:  .  diltiazem (CARDIZEM) 30 MG tablet, Take 1 tablet (30 mg total) by mouth every 6 (six) hours as needed (palpitations)., Disp: 30 tablet, Rfl: 1 .  diphenhydrAMINE (BENADRYL) 25 MG tablet, Take 25 mg by mouth at bedtime as needed., Disp: , Rfl:  .  finasteride (PROSCAR) 5 MG tablet, Take 1 tablet (5 mg total) by mouth daily., Disp: 90 tablet, Rfl: 3 .  Flaxseed, Linseed, (FLAXSEED OIL PO), Take 1 each by mouth See admin instructions. Take 15 ml by mouth daily, Disp: , Rfl:  .  Green Tea, Camellia sinensis, (GREEN TEA EXTRACT PO), Take by mouth., Disp: , Rfl:  .  Homeopathic Products (LEG CRAMP RELIEF PO), Take 2 tablets by mouth at bedtime., Disp: , Rfl:  .  L-Theanine 100 MG CAPS, Take 100 mg by mouth 2 (two) times daily., Disp: , Rfl:  .  LITHIUM PO, Take 1,000 mcg by mouth.  From life extension foundation, Disp: , Rfl:  .  MAGNESIUM PO, Take 300 mg by mouth at bedtime. , Disp: , Rfl:  .  Melatonin 3 MG CAPS, Take 3 mg by mouth at bedtime. , Disp: , Rfl:  .  Misc Natural Products (BETA-SITOSTEROL PLANT STEROLS) CAPS, Take 1 capsule by mouth daily., Disp: , Rfl:  .  Misc Natural Products (GLUCOSAMINE CHOND MSM FORMULA PO), Take 2 capsules  by mouth 2 (two) times daily., Disp: , Rfl:  .  Multiple Vitamin (MULTIVITAMIN) tablet, Take 1 tablet by mouth daily., Disp: , Rfl:  .  NON FORMULARY, Take 1 tablet by mouth daily. AMPK Supplement, Disp: , Rfl:  .  Omega-3 Fatty Acids (FISH OIL) 1000 MG CAPS, Take 1,000 mg by mouth daily. , Disp: , Rfl:  .  OVER THE COUNTER MEDICATION, Take 1 capsule by mouth daily. Bacopa Supplement, Disp: , Rfl:  .  OVER THE COUNTER MEDICATION, Neuromag, Disp: , Rfl:  .  OVER THE COUNTER MEDICATION, astaxanthim, Disp: , Rfl:  .  OVER THE COUNTER MEDICATION, [place] for eyes, Disp: , Rfl:  .  Polyvinyl Alcohol-Povidone (REFRESH OP), Place 2 drops into both eyes as needed (for dry eyes)., Disp: , Rfl:  .  Probiotic Product (PROBIOTIC DAILY PO), Take 1 capsule by mouth daily. , Disp: , Rfl:  .  sildenafil (REVATIO) 20 MG tablet, TAKE 1-5 TABLETS (20-100 MG TOTAL) BY MOUTH DAILY AS NEEDED (FOR ED)., Disp: 20 tablet, Rfl: 2 .  tamsulosin (FLOMAX) 0.4 MG CAPS capsule, Take 1 capsule (0.4 mg total) by mouth daily., Disp: 90 capsule, Rfl: 3 .  Turmeric 500 MG CAPS, Take by mouth., Disp: , Rfl:  .  zinc gluconate 50 MG tablet, Take 50 mg by mouth daily., Disp: , Rfl:  .  Menaquinone-7 (VITAMIN K2 PO), Take 1 tablet by mouth daily. (Patient not taking: Reported on 07/18/2020), Disp: , Rfl:   No Known Allergies   ROS  Constitutional: Negative for fever or weight change.  Respiratory: Negative for cough and shortness of breath.   Cardiovascular: Negative for chest pain or palpitations.  Gastrointestinal: Negative for abdominal pain, no bowel changes.  Musculoskeletal: Negative for gait problem or joint swelling.  Skin: Negative for rash.  Neurological: Negative for dizziness or headache.  No other specific complaints in a complete review of systems (except as listed in HPI above).   Objective  Vitals:   07/18/20 0956  BP: 122/70  Pulse: 71  Resp: 16  Temp: 98.2 F (36.8 C)  TempSrc: Oral  SpO2: 95%   Weight: 172 lb (78 kg)  Height: 5\' 10"  (1.778 m)    Body mass index is 24.68 kg/m.  Physical Exam  Constitutional: Patient appears well-developed and well-nourished. No distress.  HENT: Head: Normocephalic and atraumatic. Ears: cerumen impaction  Nose: Nose normal. Mouth/Throat: not done .  Eyes: Conjunctivae and EOM are normal. Pupils are equal, round, and reactive to light. No scleral icterus.  Neck: Normal range of motion. Neck supple. No JVD present. No thyromegaly present.  Cardiovascular: Normal rate, regular rhythm and normal heart sounds.  No murmur heard. No BLE edema. Pulmonary/Chest: Effort normal and breath sounds normal. No respiratory distress. Abdominal: Soft. Bowel sounds are normal, no distension. There is no tenderness. no masses MALE GENITALIA: not done ,sees Urologist  RECTAL:not done  Musculoskeletal: Normal range of motion, no joint effusions. No gross deformities Neurological: he is alert and oriented to person, place, and time. No cranial nerve deficit. Coordination,  balance, strength, speech and gait are normal.  Skin: Skin is warm and dry. No rash noted. No erythema.  Psychiatric: Patient has a normal mood and affect. behavior is normal. Judgment and thought content normal.  Fall Risk: Fall Risk  07/18/2020 06/07/2020 07/18/2019 05/20/2019 03/29/2019  Falls in the past year? 0 0 0 0 0  Comment - - - - -  Number falls in past yr: 0 0 0 0 0  Injury with Fall? 0 0 0 0 0  Risk for fall due to : - No Fall Risks - No Fall Risks -  Follow up - Falls prevention discussed - Falls prevention discussed -     Functional Status Survey: Is the patient deaf or have difficulty hearing?: No Does the patient have difficulty seeing, even when wearing glasses/contacts?: No Does the patient have difficulty concentrating, remembering, or making decisions?: No Does the patient have difficulty walking or climbing stairs?: No Does the patient have difficulty dressing or bathing?:  No Does the patient have difficulty doing errands alone such as visiting a doctor's office or shopping?: No    Assessment & Plan  1. Well adult exam  He has rectal exam done by Urological    -Prostate cancer screening and PSA options (with potential risks and benefits of testing vs not testing) were discussed along with recent recs/guidelines. -USPSTF grade A and B recommendations reviewed with patient; age-appropriate recommendations, preventive care, screening tests, etc discussed and encouraged; healthy living encouraged; see AVS for patient education given to patient -Discussed importance of 150 minutes of physical activity weekly, eat two servings of fish weekly, eat one serving of tree nuts ( cashews, pistachios, pecans, almonds.Marland Kitchen) every other day, eat 6 servings of fruit/vegetables daily and drink plenty of water and avoid sweet beverages.

## 2020-07-18 ENCOUNTER — Encounter: Payer: Self-pay | Admitting: Family Medicine

## 2020-07-18 ENCOUNTER — Other Ambulatory Visit: Payer: Self-pay

## 2020-07-18 ENCOUNTER — Ambulatory Visit (INDEPENDENT_AMBULATORY_CARE_PROVIDER_SITE_OTHER): Payer: Medicare HMO | Admitting: Family Medicine

## 2020-07-18 VITALS — BP 122/70 | HR 71 | Temp 98.2°F | Resp 16 | Ht 70.0 in | Wt 172.0 lb

## 2020-07-18 DIAGNOSIS — N529 Male erectile dysfunction, unspecified: Secondary | ICD-10-CM

## 2020-07-18 DIAGNOSIS — Z0000 Encounter for general adult medical examination without abnormal findings: Secondary | ICD-10-CM | POA: Diagnosis not present

## 2020-07-18 MED ORDER — SILDENAFIL CITRATE 50 MG PO TABS: 25.0000 mg | ORAL_TABLET | Freq: Every day | ORAL | 0 refills | Status: DC | PRN

## 2020-07-18 NOTE — Patient Instructions (Addendum)
Get shingrix vaccine at local pharmacy  Preventive Care 74 Years and Older, Male Preventive care refers to lifestyle choices and visits with your health care provider that can promote health and wellness. This includes:  A yearly physical exam. This is also called an annual wellness visit.  Regular dental and eye exams.  Immunizations.  Screening for certain conditions.  Healthy lifestyle choices, such as: ? Eating a healthy diet. ? Getting regular exercise. ? Not using drugs or products that contain nicotine and tobacco. ? Limiting alcohol use. What can I expect for my preventive care visit? Physical exam Your health care provider will check your:  Height and weight. These may be used to calculate your BMI (body mass index). BMI is a measurement that tells if you are at a healthy weight.  Heart rate and blood pressure.  Body temperature.  Skin for abnormal spots. Counseling Your health care provider may ask you questions about your:  Past medical problems.  Family's medical history.  Alcohol, tobacco, and drug use.  Emotional well-being.  Home life and relationship well-being.  Sexual activity.  Diet, exercise, and sleep habits.  History of falls.  Memory and ability to understand (cognition).  Work and work Statistician.  Access to firearms. What immunizations do I need? Vaccines are usually given at various ages, according to a schedule. Your health care provider will recommend vaccines for you based on your age, medical history, and lifestyle or other factors, such as travel or where you work.   What tests do I need? Blood tests  Lipid and cholesterol levels. These may be checked every 5 years, or more often depending on your overall health.  Hepatitis C test.  Hepatitis B test. Screening  Lung cancer screening. You may have this screening every year starting at age 74 if you have a 30-pack-year history of smoking and currently smoke or have quit  within the past 15 years.  Colorectal cancer screening. ? All adults should have this screening starting at age 74 and continuing until age 74. ? Your health care provider may recommend screening at age 74 if you are at increased risk. ? You will have tests every 1-10 years, depending on your results and the type of screening test.  Prostate cancer screening. Recommendations will vary depending on your family history and other risks.  Genital exam to check for testicular cancer or hernias.  Diabetes screening. ? This is done by checking your blood sugar (glucose) after you have not eaten for a while (fasting). ? You may have this done every 1-3 years.  Abdominal aortic aneurysm (AAA) screening. You may need this if you are a current or former smoker.  STD (sexually transmitted disease) testing, if you are at risk. Follow these instructions at home: Eating and drinking  Eat a diet that includes fresh fruits and vegetables, whole grains, lean protein, and low-fat dairy products. Limit your intake of foods with high amounts of sugar, saturated fats, and salt.  Take vitamin and mineral supplements as recommended by your health care provider.  Do not drink alcohol if your health care provider tells you not to drink.  If you drink alcohol: ? Limit how much you have to 0-2 drinks a day. ? Be aware of how much alcohol is in your drink. In the [place] one drink equals one 12 oz bottle of beer (355 mL), one 5 oz glass of wine (148 mL), or one 1 oz glass of hard liquor (44 mL).   Lifestyle  Take daily care of your teeth and gums. Brush your teeth every morning and night with fluoride toothpaste. Floss one time each day.  Stay active. Exercise for at least 30 minutes 5 or more days each week.  Do not use any products that contain nicotine or tobacco, such as cigarettes, e-cigarettes, and chewing tobacco. If you need help quitting, ask your health care provider.  Do not use drugs.  If you  are sexually active, practice safe sex. Use a condom or other form of protection to prevent STIs (sexually transmitted infections).  Talk with your health care provider about taking a low-dose aspirin or statin.  Find healthy ways to cope with stress, such as: ? Meditation, yoga, or listening to music. ? Journaling. ? Talking to a trusted person. ? Spending time with friends and family. Safety  Always wear your seat belt while driving or riding in a vehicle.  Do not drive: ? If you have been drinking alcohol. Do not ride with someone who has been drinking. ? When you are tired or distracted. ? While texting.  Wear a helmet and other protective equipment during sports activities.  If you have firearms in your house, make sure you follow all gun safety procedures. What's next?  Visit your health care provider once a year for an annual wellness visit.  Ask your health care provider how often you should have your eyes and teeth checked.  Stay up to date on all vaccines. This information is not intended to replace advice given to you by your health care provider. Make sure you discuss any questions you have with your health care provider. Document Revised: 01/11/2019 Document Reviewed: 04/08/2018 Elsevier Patient Education  2021 [employer].

## 2020-07-31 DIAGNOSIS — M9902 Segmental and somatic dysfunction of thoracic region: Secondary | ICD-10-CM | POA: Diagnosis not present

## 2020-07-31 DIAGNOSIS — M546 Pain in thoracic spine: Secondary | ICD-10-CM | POA: Diagnosis not present

## 2020-07-31 DIAGNOSIS — M9901 Segmental and somatic dysfunction of cervical region: Secondary | ICD-10-CM | POA: Diagnosis not present

## 2020-07-31 DIAGNOSIS — Z872 Personal history of diseases of the skin and subcutaneous tissue: Secondary | ICD-10-CM | POA: Diagnosis not present

## 2020-07-31 DIAGNOSIS — L578 Other skin changes due to chronic exposure to nonionizing radiation: Secondary | ICD-10-CM | POA: Diagnosis not present

## 2020-07-31 DIAGNOSIS — Z85828 Personal history of other malignant neoplasm of skin: Secondary | ICD-10-CM | POA: Diagnosis not present

## 2020-07-31 DIAGNOSIS — M531 Cervicobrachial syndrome: Secondary | ICD-10-CM | POA: Diagnosis not present

## 2020-07-31 DIAGNOSIS — M9903 Segmental and somatic dysfunction of lumbar region: Secondary | ICD-10-CM | POA: Diagnosis not present

## 2020-07-31 DIAGNOSIS — M5137 Other intervertebral disc degeneration, lumbosacral region: Secondary | ICD-10-CM | POA: Diagnosis not present

## 2020-08-26 LAB — LIPID PANEL
Cholesterol: 219 — AB (ref 0–200)
HDL: 64 (ref 35–70)
LDL Cholesterol: 138
LDl/HDL Ratio: 3.4
Triglycerides: 97 (ref 40–160)

## 2020-08-26 LAB — COMPREHENSIVE METABOLIC PANEL
Albumin: 4.6 (ref 3.5–5.0)
Calcium: 9.5 (ref 8.7–10.7)
Globulin: 1.8

## 2020-08-26 LAB — CBC AND DIFFERENTIAL
HCT: 48 (ref 41–53)
Hemoglobin: 16.6 (ref 13.5–17.5)
Platelets: 270 (ref 150–399)
WBC: 5.1

## 2020-08-26 LAB — HEMOGLOBIN A1C: Hemoglobin A1C: 5.3

## 2020-08-26 LAB — BASIC METABOLIC PANEL
BUN: 15 (ref 4–21)
Chloride: 104 (ref 99–108)
Creatinine: 0.9 (ref 0.6–1.3)
Potassium: 4.2 (ref 3.4–5.3)
Sodium: 143 (ref 137–147)

## 2020-08-26 LAB — HEPATIC FUNCTION PANEL
ALT: 18 (ref 10–40)
AST: 22 (ref 14–40)
Alkaline Phosphatase: 88 (ref 25–125)
Bilirubin, Total: 0.4

## 2020-08-26 LAB — TESTOSTERONE: Testosterone: 712

## 2020-08-26 LAB — TSH: TSH: 2.26 (ref 0.41–5.90)

## 2020-08-26 LAB — VITAMIN D 25 HYDROXY (VIT D DEFICIENCY, FRACTURES): Vit D, 25-Hydroxy: 70.6

## 2020-08-26 LAB — CBC: RBC: 5.26 — AB (ref 3.87–5.11)

## 2020-08-27 ENCOUNTER — Encounter: Payer: Self-pay | Admitting: Family Medicine

## 2020-08-28 ENCOUNTER — Encounter: Payer: Self-pay | Admitting: Family Medicine

## 2020-09-10 ENCOUNTER — Encounter: Payer: Self-pay | Admitting: Family Medicine

## 2020-10-03 DIAGNOSIS — M531 Cervicobrachial syndrome: Secondary | ICD-10-CM | POA: Diagnosis not present

## 2020-10-03 DIAGNOSIS — M9901 Segmental and somatic dysfunction of cervical region: Secondary | ICD-10-CM | POA: Diagnosis not present

## 2020-10-03 DIAGNOSIS — M9903 Segmental and somatic dysfunction of lumbar region: Secondary | ICD-10-CM | POA: Diagnosis not present

## 2020-10-03 DIAGNOSIS — M546 Pain in thoracic spine: Secondary | ICD-10-CM | POA: Diagnosis not present

## 2020-10-03 DIAGNOSIS — M9902 Segmental and somatic dysfunction of thoracic region: Secondary | ICD-10-CM | POA: Diagnosis not present

## 2020-10-03 DIAGNOSIS — M5137 Other intervertebral disc degeneration, lumbosacral region: Secondary | ICD-10-CM | POA: Diagnosis not present

## 2020-10-11 DIAGNOSIS — H6123 Impacted cerumen, bilateral: Secondary | ICD-10-CM | POA: Diagnosis not present

## 2020-10-11 DIAGNOSIS — H903 Sensorineural hearing loss, bilateral: Secondary | ICD-10-CM | POA: Diagnosis not present

## 2020-11-06 DIAGNOSIS — M9903 Segmental and somatic dysfunction of lumbar region: Secondary | ICD-10-CM | POA: Diagnosis not present

## 2020-11-06 DIAGNOSIS — M5137 Other intervertebral disc degeneration, lumbosacral region: Secondary | ICD-10-CM | POA: Diagnosis not present

## 2020-11-06 DIAGNOSIS — M9901 Segmental and somatic dysfunction of cervical region: Secondary | ICD-10-CM | POA: Diagnosis not present

## 2020-11-06 DIAGNOSIS — M531 Cervicobrachial syndrome: Secondary | ICD-10-CM | POA: Diagnosis not present

## 2020-11-06 DIAGNOSIS — M546 Pain in thoracic spine: Secondary | ICD-10-CM | POA: Diagnosis not present

## 2020-11-06 DIAGNOSIS — M9902 Segmental and somatic dysfunction of thoracic region: Secondary | ICD-10-CM | POA: Diagnosis not present

## 2020-11-14 DIAGNOSIS — M9903 Segmental and somatic dysfunction of lumbar region: Secondary | ICD-10-CM | POA: Diagnosis not present

## 2020-11-14 DIAGNOSIS — M9901 Segmental and somatic dysfunction of cervical region: Secondary | ICD-10-CM | POA: Diagnosis not present

## 2020-11-14 DIAGNOSIS — M5137 Other intervertebral disc degeneration, lumbosacral region: Secondary | ICD-10-CM | POA: Diagnosis not present

## 2020-11-14 DIAGNOSIS — M9902 Segmental and somatic dysfunction of thoracic region: Secondary | ICD-10-CM | POA: Diagnosis not present

## 2020-11-14 DIAGNOSIS — M546 Pain in thoracic spine: Secondary | ICD-10-CM | POA: Diagnosis not present

## 2020-11-14 DIAGNOSIS — M531 Cervicobrachial syndrome: Secondary | ICD-10-CM | POA: Diagnosis not present

## 2020-12-04 DIAGNOSIS — M9903 Segmental and somatic dysfunction of lumbar region: Secondary | ICD-10-CM | POA: Diagnosis not present

## 2020-12-04 DIAGNOSIS — M5137 Other intervertebral disc degeneration, lumbosacral region: Secondary | ICD-10-CM | POA: Diagnosis not present

## 2020-12-04 DIAGNOSIS — M9901 Segmental and somatic dysfunction of cervical region: Secondary | ICD-10-CM | POA: Diagnosis not present

## 2020-12-04 DIAGNOSIS — M9902 Segmental and somatic dysfunction of thoracic region: Secondary | ICD-10-CM | POA: Diagnosis not present

## 2020-12-04 DIAGNOSIS — M531 Cervicobrachial syndrome: Secondary | ICD-10-CM | POA: Diagnosis not present

## 2020-12-04 DIAGNOSIS — M546 Pain in thoracic spine: Secondary | ICD-10-CM | POA: Diagnosis not present

## 2021-01-07 DIAGNOSIS — M9902 Segmental and somatic dysfunction of thoracic region: Secondary | ICD-10-CM | POA: Diagnosis not present

## 2021-01-07 DIAGNOSIS — M9901 Segmental and somatic dysfunction of cervical region: Secondary | ICD-10-CM | POA: Diagnosis not present

## 2021-01-07 DIAGNOSIS — M531 Cervicobrachial syndrome: Secondary | ICD-10-CM | POA: Diagnosis not present

## 2021-01-07 DIAGNOSIS — M546 Pain in thoracic spine: Secondary | ICD-10-CM | POA: Diagnosis not present

## 2021-01-07 DIAGNOSIS — M5137 Other intervertebral disc degeneration, lumbosacral region: Secondary | ICD-10-CM | POA: Diagnosis not present

## 2021-01-07 DIAGNOSIS — M9903 Segmental and somatic dysfunction of lumbar region: Secondary | ICD-10-CM | POA: Diagnosis not present

## 2021-01-09 DIAGNOSIS — M9902 Segmental and somatic dysfunction of thoracic region: Secondary | ICD-10-CM | POA: Diagnosis not present

## 2021-01-09 DIAGNOSIS — M9901 Segmental and somatic dysfunction of cervical region: Secondary | ICD-10-CM | POA: Diagnosis not present

## 2021-01-09 DIAGNOSIS — M9903 Segmental and somatic dysfunction of lumbar region: Secondary | ICD-10-CM | POA: Diagnosis not present

## 2021-01-09 DIAGNOSIS — M5137 Other intervertebral disc degeneration, lumbosacral region: Secondary | ICD-10-CM | POA: Diagnosis not present

## 2021-01-09 DIAGNOSIS — M531 Cervicobrachial syndrome: Secondary | ICD-10-CM | POA: Diagnosis not present

## 2021-01-09 DIAGNOSIS — M546 Pain in thoracic spine: Secondary | ICD-10-CM | POA: Diagnosis not present

## 2021-01-10 DIAGNOSIS — M9902 Segmental and somatic dysfunction of thoracic region: Secondary | ICD-10-CM | POA: Diagnosis not present

## 2021-01-10 DIAGNOSIS — M5137 Other intervertebral disc degeneration, lumbosacral region: Secondary | ICD-10-CM | POA: Diagnosis not present

## 2021-01-10 DIAGNOSIS — M531 Cervicobrachial syndrome: Secondary | ICD-10-CM | POA: Diagnosis not present

## 2021-01-10 DIAGNOSIS — M9901 Segmental and somatic dysfunction of cervical region: Secondary | ICD-10-CM | POA: Diagnosis not present

## 2021-01-10 DIAGNOSIS — M9903 Segmental and somatic dysfunction of lumbar region: Secondary | ICD-10-CM | POA: Diagnosis not present

## 2021-01-10 DIAGNOSIS — M546 Pain in thoracic spine: Secondary | ICD-10-CM | POA: Diagnosis not present

## 2021-01-15 DIAGNOSIS — M531 Cervicobrachial syndrome: Secondary | ICD-10-CM | POA: Diagnosis not present

## 2021-01-15 DIAGNOSIS — M5137 Other intervertebral disc degeneration, lumbosacral region: Secondary | ICD-10-CM | POA: Diagnosis not present

## 2021-01-15 DIAGNOSIS — M9902 Segmental and somatic dysfunction of thoracic region: Secondary | ICD-10-CM | POA: Diagnosis not present

## 2021-01-15 DIAGNOSIS — M9903 Segmental and somatic dysfunction of lumbar region: Secondary | ICD-10-CM | POA: Diagnosis not present

## 2021-01-15 DIAGNOSIS — M9901 Segmental and somatic dysfunction of cervical region: Secondary | ICD-10-CM | POA: Diagnosis not present

## 2021-01-15 DIAGNOSIS — M546 Pain in thoracic spine: Secondary | ICD-10-CM | POA: Diagnosis not present

## 2021-01-16 ENCOUNTER — Other Ambulatory Visit: Payer: Self-pay | Admitting: Family Medicine

## 2021-01-16 DIAGNOSIS — M9903 Segmental and somatic dysfunction of lumbar region: Secondary | ICD-10-CM | POA: Diagnosis not present

## 2021-01-16 DIAGNOSIS — M5137 Other intervertebral disc degeneration, lumbosacral region: Secondary | ICD-10-CM | POA: Diagnosis not present

## 2021-01-16 DIAGNOSIS — N401 Enlarged prostate with lower urinary tract symptoms: Secondary | ICD-10-CM

## 2021-01-16 DIAGNOSIS — M531 Cervicobrachial syndrome: Secondary | ICD-10-CM | POA: Diagnosis not present

## 2021-01-16 DIAGNOSIS — M9902 Segmental and somatic dysfunction of thoracic region: Secondary | ICD-10-CM | POA: Diagnosis not present

## 2021-01-16 DIAGNOSIS — M546 Pain in thoracic spine: Secondary | ICD-10-CM | POA: Diagnosis not present

## 2021-01-16 DIAGNOSIS — M9901 Segmental and somatic dysfunction of cervical region: Secondary | ICD-10-CM | POA: Diagnosis not present

## 2021-01-16 DIAGNOSIS — N138 Other obstructive and reflux uropathy: Secondary | ICD-10-CM

## 2021-01-21 ENCOUNTER — Ambulatory Visit: Payer: Medicare HMO | Admitting: Family Medicine

## 2021-01-31 DIAGNOSIS — M9901 Segmental and somatic dysfunction of cervical region: Secondary | ICD-10-CM | POA: Diagnosis not present

## 2021-01-31 DIAGNOSIS — M531 Cervicobrachial syndrome: Secondary | ICD-10-CM | POA: Diagnosis not present

## 2021-01-31 DIAGNOSIS — M9903 Segmental and somatic dysfunction of lumbar region: Secondary | ICD-10-CM | POA: Diagnosis not present

## 2021-01-31 DIAGNOSIS — M5137 Other intervertebral disc degeneration, lumbosacral region: Secondary | ICD-10-CM | POA: Diagnosis not present

## 2021-01-31 DIAGNOSIS — M9902 Segmental and somatic dysfunction of thoracic region: Secondary | ICD-10-CM | POA: Diagnosis not present

## 2021-01-31 DIAGNOSIS — M546 Pain in thoracic spine: Secondary | ICD-10-CM | POA: Diagnosis not present

## 2021-02-26 ENCOUNTER — Ambulatory Visit (INDEPENDENT_AMBULATORY_CARE_PROVIDER_SITE_OTHER): Payer: Medicare HMO | Admitting: Family Medicine

## 2021-02-26 ENCOUNTER — Encounter: Payer: Self-pay | Admitting: Family Medicine

## 2021-02-26 ENCOUNTER — Other Ambulatory Visit: Payer: Self-pay

## 2021-02-26 VITALS — BP 122/66 | HR 71 | Temp 97.8°F | Resp 14 | Ht 70.0 in | Wt 167.0 lb

## 2021-02-26 DIAGNOSIS — E7800 Pure hypercholesterolemia, unspecified: Secondary | ICD-10-CM

## 2021-02-26 DIAGNOSIS — I700 Atherosclerosis of aorta: Secondary | ICD-10-CM

## 2021-02-26 DIAGNOSIS — I2510 Atherosclerotic heart disease of native coronary artery without angina pectoris: Secondary | ICD-10-CM | POA: Diagnosis not present

## 2021-02-26 DIAGNOSIS — J432 Centrilobular emphysema: Secondary | ICD-10-CM | POA: Diagnosis not present

## 2021-02-26 DIAGNOSIS — N401 Enlarged prostate with lower urinary tract symptoms: Secondary | ICD-10-CM

## 2021-02-26 DIAGNOSIS — M51379 Other intervertebral disc degeneration, lumbosacral region without mention of lumbar back pain or lower extremity pain: Secondary | ICD-10-CM

## 2021-02-26 DIAGNOSIS — I872 Venous insufficiency (chronic) (peripheral): Secondary | ICD-10-CM

## 2021-02-26 DIAGNOSIS — D692 Other nonthrombocytopenic purpura: Secondary | ICD-10-CM | POA: Diagnosis not present

## 2021-02-26 DIAGNOSIS — N529 Male erectile dysfunction, unspecified: Secondary | ICD-10-CM

## 2021-02-26 DIAGNOSIS — N138 Other obstructive and reflux uropathy: Secondary | ICD-10-CM | POA: Insufficient documentation

## 2021-02-26 DIAGNOSIS — Z23 Encounter for immunization: Secondary | ICD-10-CM | POA: Diagnosis not present

## 2021-02-26 DIAGNOSIS — M5137 Other intervertebral disc degeneration, lumbosacral region: Secondary | ICD-10-CM | POA: Insufficient documentation

## 2021-02-26 DIAGNOSIS — I480 Paroxysmal atrial fibrillation: Secondary | ICD-10-CM

## 2021-02-26 LAB — PSA: PSA: 1.5

## 2021-02-26 MED ORDER — TAMSULOSIN HCL 0.4 MG PO CAPS: 0.4000 mg | ORAL_CAPSULE | Freq: Every day | ORAL | 3 refills | Status: DC

## 2021-02-26 NOTE — Progress Notes (Signed)
Name: Steven Griffin   MRN: [id number]    DOB: 1946-01-09   Date:02/26/2021       Progress Note  Subjective  Chief Complaint  Follow up   HPI  Hyperlipidemia: he does not want to take statin therapy , last LDL down to 118 done in May 2021, he had high Apo B and small LDL , repeat LDL was 138 05/22 .   The 10-year ASCVD risk score (Arnett DK, et al., 2019) is: 26%*   Values used to calculate the score:     Age: 75 years     Sex: Male     Is Non-Hispanic African American: No     Diabetic: No     Tobacco smoker: Yes     Systolic Blood Pressure: 323 mmHg     Is BP treated: Yes     HDL Cholesterol: 64 mg/dL*     Total Cholesterol: 219 mg/dL*     * - Cholesterol units were assumed for this score calculation    BPH: he used to see Dr. Rogers Blocker however since 2019 he switched provider and is now going to [facility] in [place], he brought a copy of his last PSA  2.29 Aug 2018, repeat May 2021 was  2.3 and last May was 1.5 . He is on Finasteride and Tamsulosin. He states he has not used viagra in a long time His testosterone level has been within normal range    Afib with  AV node ablation: Nov 2018, doing well , denies chest pain, palpitation,  or decrease in exercise tolerance off medication since ablation, and CHADS2VASC score is 2, he is no on anticoagulation. Unchanged    Chronic venous insufficiency: seeing Dr. Lucky Cowboy, last visit 03/2018, he has a history of phlebitis but not recurrence since first episode in 2019. He denies pain or edema, doing well at this time    Emphysema : he had COVID-17 Mar 2019 but no problems since. He  states he is back to his baseline, no cough, wheezing or SOB.  He still smokes - he says he does not inhale. He does not want any inhalers    Aorta Atherosclerosis: : he continues to refuse statin therapy, he is on a healthy diet  , he is on aspirin daily  He also has atherosclerosis of Left anterior descending coronary. He denies decrease in exercise tolerance.  He is taking Red Yeast Rice otc   Chronic back pain: he has DDD and sees chiropractor Dr. Raynelle Chary for adjustments and also core exercises at home.   Senile Purpura: stable , he still has easy bruising and reassurance given   Patient Active Problem List   Diagnosis Date Noted   DDD (degenerative disc disease), lumbosacral 02/26/2021   Benign prostatic hyperplasia with urinary obstruction 02/26/2021   Centrilobular emphysema ([place]) 02/26/2021   Coronary artery disease involving native coronary artery of native heart without angina pectoris 12/13/2019   Atherosclerosis of aorta ([place]) 12/13/2019   Senile purpura ([place]) 12/13/2019   Personal history of tobacco use, presenting hazards to health 11/25/2018   Essential hypertension, benign 02/23/2018   Superficial thrombophlebitis 02/23/2018   Phlebitis of left lower extremity 01/26/2018   Paroxysmal atrial fibrillation ([place]) 03/03/2017   Bradycardia 08/31/2015   Muscle tear 04/20/2015   Allergic rhinitis, seasonal 04/20/2015   Chronic venous insufficiency 10/27/2014   Intermittent tremor 10/27/2014   ED (erectile dysfunction) of non-organic origin 10/27/2014   Colon, diverticulosis 10/27/2014   Benign prostatic hypertrophy without urinary  obstruction 10/27/2014   Bundle branch block, right and left anterior fascicular 06/07/2013   Atrial fibrillation ([place]) 10/20/2012   Obstructive apnea 03/23/2012   Polypharmacy 04/15/2011   On dofetilide therapy 04/15/2011   Hypercholesteremia 01/18/2011   H/O squamous cell carcinoma of skin 01/18/2011   History of major vascular surgery 01/18/2011    Past Surgical History:  Procedure Laterality Date   ATRIAL FIBRILLATION ABLATION N/A 03/03/2017   Procedure: Atrial Fibrillation Ablation;  Surgeon: [name], MD;  Location: [facility];  Service: Cardiovascular;  Laterality: N/A;   COLONOSCOPY WITH PROPOFOL N/A 11/20/2014   Procedure: COLONOSCOPY WITH PROPOFOL;  Surgeon: Manya Silvas, MD;   Location: [facility]COPY;  Service: Endoscopy;  Laterality: N/A;   rib cartilage     benign tumor   SKIN CANCER EXCISION Right 06/2010   lower leg   TONSILLECTOMY AND ADENOIDECTOMY     VEIN LIGATION AND STRIPPING      Family History  Problem Relation Age of Onset   Suicidality Mother    Leukemia Father    Hypercholesterolemia Father    Hypertension Brother    Hyperlipidemia Brother    Cirrhosis Brother    Colon cancer Maternal Grandmother     Social History   Tobacco Use   Smoking status: Every Day    Packs/day: 1.00    Years: 35.00    Pack years: 35.00    Types: Cigarettes, Cigars   Smokeless tobacco: Never   Tobacco comments:    patient uses natural tobacco (rolls his own)  Substance Use Topics   Alcohol use: Yes    Alcohol/week: 1.0 standard drink    Types: 1 Glasses of wine per week    Comment: 2-3 glasses of wine at night     Current Outpatient Medications:    5-Hydroxytryptophan (5-HTP) 100 MG CAPS, Take 100 mg by mouth at bedtime. , Disp: , Rfl:    aspirin EC 81 MG tablet, Take 81 mg by mouth daily., Disp: , Rfl:    Coenzyme Q10 (CO Q 10) 100 MG CAPS, Take 100 mg by mouth daily., Disp: , Rfl:    diltiazem (CARDIZEM) 30 MG tablet, Take 1 tablet (30 mg total) by mouth every 6 (six) hours as needed (palpitations)., Disp: 30 tablet, Rfl: 1   diphenhydrAMINE (BENADRYL) 25 MG tablet, Take 25 mg by mouth at bedtime as needed., Disp: , Rfl:    finasteride (PROSCAR) 5 MG tablet, Take 1 tablet (5 mg total) by mouth daily., Disp: 90 tablet, Rfl: 3   Flaxseed, Linseed, (FLAXSEED OIL PO), Take 1 each by mouth See admin instructions. Take 15 ml by mouth daily, Disp: , Rfl:    Green Tea, Camellia sinensis, (GREEN TEA EXTRACT PO), Take by mouth., Disp: , Rfl:    Homeopathic Products (LEG CRAMP RELIEF PO), Take 2 tablets by mouth at bedtime., Disp: , Rfl:    L-Theanine 100 MG CAPS, Take 100 mg by mouth 2 (two) times daily., Disp: , Rfl:    LITHIUM PO, Take 1,000 mcg by mouth.  From life extension foundation, Disp: , Rfl:    MAGNESIUM PO, Take 300 mg by mouth at bedtime. , Disp: , Rfl:    Melatonin 3 MG CAPS, Take 3 mg by mouth at bedtime. , Disp: , Rfl:    Misc Natural Products (BETA-SITOSTEROL PLANT STEROLS) CAPS, Take 1 capsule by mouth daily., Disp: , Rfl:    Misc Natural Products (GLUCOSAMINE CHOND MSM FORMULA PO), Take 2 capsules by mouth 2 (  two) times daily., Disp: , Rfl:    Multiple Vitamin (MULTIVITAMIN) tablet, Take 1 tablet by mouth daily., Disp: , Rfl:    NON FORMULARY, Take 1 tablet by mouth daily. AMPK Supplement, Disp: , Rfl:    Omega-3 Fatty Acids (FISH OIL) 1000 MG CAPS, Take 1,000 mg by mouth daily. , Disp: , Rfl:    OVER THE COUNTER MEDICATION, Take 1 capsule by mouth daily. Bacopa Supplement, Disp: , Rfl:    OVER THE COUNTER MEDICATION, Neuromag, Disp: , Rfl:    OVER THE COUNTER MEDICATION, astaxanthim, Disp: , Rfl:    OVER THE COUNTER MEDICATION, [place]uguard for eyes, Disp: , Rfl:    Polyvinyl Alcohol-Povidone (REFRESH OP), Place 2 drops into both eyes as needed (for dry eyes)., Disp: , Rfl:    Probiotic Product (PROBIOTIC DAILY PO), Take 1 capsule by mouth daily. , Disp: , Rfl:    sildenafil (VIAGRA) 50 MG tablet, Take 0.5-2 tablets (25-100 mg total) by mouth daily as needed for erectile dysfunction., Disp: 60 tablet, Rfl: 0   tamsulosin (FLOMAX) 0.4 MG CAPS capsule, TAKE 1 CAPSULE BY MOUTH EVERY DAY, Disp: 90 capsule, Rfl: 1   Turmeric 500 MG CAPS, Take by mouth., Disp: , Rfl:    zinc gluconate 50 MG tablet, Take 50 mg by mouth daily., Disp: , Rfl:    Cholecalciferol (VITAMIN D3 PO), Take 1 capsule by mouth daily. (Patient not taking: Reported on 02/26/2021), Disp: , Rfl:    Digestive Enzymes (DIGESTIVE ENZYME PO), Take 1 capsule by mouth 3 (three) times daily.  (Patient not taking: Reported on 02/26/2021), Disp: , Rfl:   No Known Allergies  I personally reviewed active problem list, medication list, allergies, family history, social history,  health maintenance with the patient/caregiver today.   ROS  Constitutional: Negative for fever or weight change.  Respiratory: Negative for cough and shortness of breath.   Cardiovascular: Negative for chest pain or palpitations.  Gastrointestinal: Negative for abdominal pain, no bowel changes.  Musculoskeletal: Negative for gait problem or joint swelling.  Skin: Negative for rash.  Neurological: Negative for dizziness or headache.  No other specific complaints in a complete review of systems (except as listed in HPI above).   Objective  Vitals:   02/26/21 1058  BP: 122/66  Pulse: 71  Resp: 14  Temp: 97.8 F (36.6 C)  TempSrc: Oral  SpO2: 96%  Weight: 167 lb (75.8 kg)  Height: 5\' 10"  (1.778 m)    Body mass index is 23.96 kg/m.  Physical Exam  Constitutional: Patient appears well-developed and well-nourished.  No distress.  HEENT: head atraumatic, normocephalic, pupils equal and reactive to light, neck supple, Cardiovascular: Normal rate, regular rhythm and normal heart sounds.  No murmur heard. No BLE edema. Pulmonary/Chest: Effort normal and breath sounds normal. No respiratory distress. Abdominal: Soft.  There is no tenderness. Psychiatric: Patient has a normal mood and affect. behavior is normal. Judgment and thought content normal.   PHQ2/9: Depression screen [facility] 2/9 02/26/2021 07/18/2020 06/07/2020 07/18/2019 05/20/2019  Decreased Interest 0 0 0 0 0  Down, Depressed, Hopeless 0 0 0 0 0  PHQ - 2 Score 0 0 0 0 0  Altered sleeping - - - 0 0  Tired, decreased energy - - - 1 0  Change in appetite - - - 0 0  Feeling bad or failure about yourself  - - - 0 0  Trouble concentrating - - - 0 0  Moving slowly or fidgety/restless - - - 0 0  Suicidal thoughts - - - 0 0  PHQ-9 Score - - - 1 0  Difficult doing work/chores - - - Not difficult at all Not difficult at all    phq 9 is negative   Fall Risk: Fall Risk  02/26/2021 07/18/2020 06/07/2020 07/18/2019 05/20/2019  Falls in  the past year? 0 0 0 0 0  Comment - - - - -  Number falls in past yr: - 0 0 0 0  Injury with Fall? - 0 0 0 0  Risk for fall due to : - - No Fall Risks - No Fall Risks  Follow up Falls prevention discussed - Falls prevention discussed - Falls prevention discussed     Functional Status Survey: Is the patient deaf or have difficulty hearing?: No Does the patient have difficulty seeing, even when wearing glasses/contacts?: No Does the patient have difficulty concentrating, remembering, or making decisions?: No Does the patient have difficulty walking or climbing stairs?: No Does the patient have difficulty dressing or bathing?: No Does the patient have difficulty doing errands alone such as visiting a doctor's office or shopping?: No    Assessment & Plan  1. Paroxysmal atrial fibrillation (HCC)  Rate controlled, status post ablation   2. Centrilobular emphysema ([place])  Discussed importance of quitting smoking   3. Chronic venous insufficiency   4. Hypercholesteremia   5. Coronary artery disease involving native coronary artery of native heart without angina pectoris   6. Senile purpura ([place])  Reassurance   7. Atherosclerosis of aorta (HCC)  Refused statin   8. Benign prostatic hyperplasia with urinary obstruction  - tamsulosin (FLOMAX) 0.4 MG CAPS capsule; Take 1 capsule (0.4 mg total) by mouth daily.  Dispense: 90 capsule; Refill: 3  9. Erectile dysfunction, unspecified erectile dysfunction type   10. Need for immunization against influenza  - Flu Vaccine QUAD High Dose(Fluad)  11. DDD (degenerative disc disease), lumbosacral

## 2021-03-06 DIAGNOSIS — Z008 Encounter for other general examination: Secondary | ICD-10-CM | POA: Diagnosis not present

## 2021-03-06 DIAGNOSIS — Z85828 Personal history of other malignant neoplasm of skin: Secondary | ICD-10-CM | POA: Diagnosis not present

## 2021-03-06 DIAGNOSIS — Z7982 Long term (current) use of aspirin: Secondary | ICD-10-CM | POA: Diagnosis not present

## 2021-03-06 DIAGNOSIS — G8929 Other chronic pain: Secondary | ICD-10-CM | POA: Diagnosis not present

## 2021-03-06 DIAGNOSIS — Z809 Family history of malignant neoplasm, unspecified: Secondary | ICD-10-CM | POA: Diagnosis not present

## 2021-03-06 DIAGNOSIS — N400 Enlarged prostate without lower urinary tract symptoms: Secondary | ICD-10-CM | POA: Diagnosis not present

## 2021-03-06 DIAGNOSIS — N529 Male erectile dysfunction, unspecified: Secondary | ICD-10-CM | POA: Diagnosis not present

## 2021-03-06 DIAGNOSIS — R030 Elevated blood-pressure reading, without diagnosis of hypertension: Secondary | ICD-10-CM | POA: Diagnosis not present

## 2021-03-06 DIAGNOSIS — F17210 Nicotine dependence, cigarettes, uncomplicated: Secondary | ICD-10-CM | POA: Diagnosis not present

## 2021-03-06 DIAGNOSIS — R69 Illness, unspecified: Secondary | ICD-10-CM | POA: Diagnosis not present

## 2021-03-06 DIAGNOSIS — K219 Gastro-esophageal reflux disease without esophagitis: Secondary | ICD-10-CM | POA: Diagnosis not present

## 2021-03-12 ENCOUNTER — Telehealth: Payer: Self-pay | Admitting: Internal Medicine

## 2021-03-12 DIAGNOSIS — H35031 Hypertensive retinopathy, right eye: Secondary | ICD-10-CM | POA: Diagnosis not present

## 2021-03-12 NOTE — Telephone Encounter (Signed)
  Sarah with [facility] would like to ask Dr. Rayann Heman to order carotid test for pt due to engorged vein on the pt's right eye

## 2021-03-12 NOTE — Telephone Encounter (Signed)
I have not seen this patient since 2018.  I cannot order the test without seeing him.  I am happy to see him for follow-up.

## 2021-03-13 ENCOUNTER — Other Ambulatory Visit: Payer: Self-pay

## 2021-03-13 ENCOUNTER — Encounter: Payer: Self-pay | Admitting: Family Medicine

## 2021-03-13 ENCOUNTER — Ambulatory Visit (INDEPENDENT_AMBULATORY_CARE_PROVIDER_SITE_OTHER): Payer: Medicare HMO | Admitting: Family Medicine

## 2021-03-13 VITALS — BP 134/76 | HR 81 | Temp 97.6°F | Resp 16 | Ht 71.0 in | Wt 168.0 lb

## 2021-03-13 DIAGNOSIS — I700 Atherosclerosis of aorta: Secondary | ICD-10-CM | POA: Diagnosis not present

## 2021-03-13 DIAGNOSIS — I2510 Atherosclerotic heart disease of native coronary artery without angina pectoris: Secondary | ICD-10-CM | POA: Diagnosis not present

## 2021-03-13 DIAGNOSIS — Z79899 Other long term (current) drug therapy: Secondary | ICD-10-CM | POA: Diagnosis not present

## 2021-03-13 MED ORDER — ROSUVASTATIN CALCIUM 5 MG PO TABS: 5.0000 mg | ORAL_TABLET | Freq: Every day | ORAL | 1 refills | Status: DC

## 2021-03-13 NOTE — Progress Notes (Signed)
Name: Steven Griffin   MRN: [id number]    DOB: [birth year]   Date:03/13/2021       Progress Note  Subjective  Chief Complaint  Consult  HPI  Abnormal eye exam: he had a recent eye exam that showed blockage on right eye and advised to discuss with PCP, he contacted cardiologist to order a carotid doppler but since he has not been seen in a long time he is here today to decide what to do. Explained that we have discussed statin therapy for atherosclerosis of aorta and also CAD of one of her coronaries but he has always rejected medication use. He is very worried about side effects of medication.   Patient Active Problem List   Diagnosis Date Noted   DDD (degenerative disc disease), lumbosacral 02/26/2021   Benign prostatic hyperplasia with urinary obstruction 02/26/2021   Centrilobular emphysema ([place]) 02/26/2021   Coronary artery disease involving native coronary artery of native heart without angina pectoris 12/13/2019   Atherosclerosis of aorta ([place]) 12/13/2019   Senile purpura ([place]) 12/13/2019   Personal history of tobacco use, presenting hazards to health 11/25/2018   Essential hypertension, benign 02/23/2018   Superficial thrombophlebitis 02/23/2018   Phlebitis of left lower extremity 01/26/2018   Paroxysmal atrial fibrillation ([place]) 03/03/2017   Bradycardia 08/31/2015   Muscle tear 04/20/2015   Allergic rhinitis, seasonal 04/20/2015   Chronic venous insufficiency 10/27/2014   Intermittent tremor 10/27/2014   ED (erectile dysfunction) of non-organic origin 10/27/2014   Colon, diverticulosis 10/27/2014   Benign prostatic hypertrophy without urinary obstruction 10/27/2014   Bundle branch block, right and left anterior fascicular 06/07/2013   Atrial fibrillation ([place]) 10/20/2012   Obstructive apnea 03/23/2012   Polypharmacy 04/15/2011   On dofetilide therapy 04/15/2011   Hypercholesteremia 01/18/2011   H/O squamous cell carcinoma of skin 01/18/2011   History of major vascular  surgery 01/18/2011    Past Surgical History:  Procedure Laterality Date   ATRIAL FIBRILLATION ABLATION N/A 03/03/2017   Procedure: Atrial Fibrillation Ablation;  Surgeon: [name], MD;  Location: [facility];  Service: Cardiovascular;  Laterality: N/A;   COLONOSCOPY WITH PROPOFOL N/A 11/20/2014   Procedure: COLONOSCOPY WITH PROPOFOL;  Surgeon: Manya Silvas, MD;  Location: [facility] ENDOSCOPY;  Service: Endoscopy;  Laterality: N/A;   rib cartilage     benign tumor   SKIN CANCER EXCISION Right 06/2010   lower leg   TONSILLECTOMY AND ADENOIDECTOMY     VEIN LIGATION AND STRIPPING      Family History  Problem Relation Age of Onset   Suicidality Mother    Leukemia Father    Hypercholesterolemia Father    Hypertension Brother    Hyperlipidemia Brother    Cirrhosis Brother    Colon cancer Maternal Grandmother     Social History   Tobacco Use   Smoking status: Every Day    Packs/day: 1.00    Years: 35.00    Pack years: 35.00    Types: Cigarettes, Cigars   Smokeless tobacco: Never   Tobacco comments:    patient uses natural tobacco (rolls his own)  Substance Use Topics   Alcohol use: Yes    Alcohol/week: 1.0 standard drink    Types: 1 Glasses of wine per week    Comment: 2-3 glasses of wine at night     Current Outpatient Medications:    5-Hydroxytryptophan (5-HTP) 100 MG CAPS, Take 100 mg by mouth at bedtime. , Disp: , Rfl:    aspirin EC 81 MG  tablet, Take 81 mg by mouth daily., Disp: , Rfl:    Cholecalciferol (VITAMIN D3 PO), Take 1 capsule by mouth daily., Disp: , Rfl:    Coenzyme Q10 (CO Q 10) 100 MG CAPS, Take 100 mg by mouth daily., Disp: , Rfl:    diltiazem (CARDIZEM) 30 MG tablet, Take 1 tablet (30 mg total) by mouth every 6 (six) hours as needed (palpitations)., Disp: 30 tablet, Rfl: 1   diphenhydrAMINE (BENADRYL) 25 MG tablet, Take 25 mg by mouth at bedtime as needed., Disp: , Rfl:    finasteride (PROSCAR) 5 MG tablet, Take 1 tablet (5 mg total) by mouth  daily., Disp: 90 tablet, Rfl: 3   Flaxseed, Linseed, (FLAXSEED OIL PO), Take 1 each by mouth See admin instructions. Take 15 ml by mouth daily, Disp: , Rfl:    Green Tea, Camellia sinensis, (GREEN TEA EXTRACT PO), Take by mouth., Disp: , Rfl:    Homeopathic Products (LEG CRAMP RELIEF PO), Take 2 tablets by mouth at bedtime., Disp: , Rfl:    L-Theanine 100 MG CAPS, Take 100 mg by mouth 2 (two) times daily., Disp: , Rfl:    LITHIUM PO, Take 1,000 mcg by mouth. From life extension foundation, Disp: , Rfl:    MAGNESIUM PO, Take 300 mg by mouth at bedtime. , Disp: , Rfl:    Melatonin 3 MG CAPS, Take 3 mg by mouth at bedtime. , Disp: , Rfl:    Misc Natural Products (BETA-SITOSTEROL PLANT STEROLS) CAPS, Take 1 capsule by mouth daily., Disp: , Rfl:    Misc Natural Products (GLUCOSAMINE CHOND MSM FORMULA PO), Take 2 capsules by mouth 2 (two) times daily., Disp: , Rfl:    Multiple Vitamin (MULTIVITAMIN) tablet, Take 1 tablet by mouth daily., Disp: , Rfl:    NON FORMULARY, Take 1 tablet by mouth daily. AMPK Supplement, Disp: , Rfl:    Omega-3 Fatty Acids (FISH OIL) 1000 MG CAPS, Take 1,000 mg by mouth daily. , Disp: , Rfl:    OVER THE COUNTER MEDICATION, Take 1 capsule by mouth daily. Bacopa Supplement, Disp: , Rfl:    OVER THE COUNTER MEDICATION, Neuromag, Disp: , Rfl:    OVER THE COUNTER MEDICATION, astaxanthim, Disp: , Rfl:    OVER THE COUNTER MEDICATION, [place]ard for eyes, Disp: , Rfl:    Polyvinyl Alcohol-Povidone (REFRESH OP), Place 2 drops into both eyes as needed (for dry eyes)., Disp: , Rfl:    Probiotic Product (PROBIOTIC DAILY PO), Take 1 capsule by mouth daily. , Disp: , Rfl:    tamsulosin (FLOMAX) 0.4 MG CAPS capsule, Take 1 capsule (0.4 mg total) by mouth daily., Disp: 90 capsule, Rfl: 3   Turmeric 500 MG CAPS, Take by mouth., Disp: , Rfl:    zinc gluconate 50 MG tablet, Take 50 mg by mouth daily., Disp: , Rfl:   No Known Allergies  I personally reviewed active problem list, medication  list, allergies, family history, social history, health maintenance with the patient/caregiver today.   ROS  Ten systems reviewed and is negative except as mentioned in HPI   Objective  Vitals:   03/13/21 1311  BP: 134/76  Pulse: 81  Resp: 16  Temp: 97.6 F (36.4 C)  SpO2: 96%  Weight: 168 lb (76.2 kg)  Height: 5\' 11"  (1.803 m)    Body mass index is 23.43 kg/m.  Physical Exam  Constitutional: Patient appears well-developed and well-nourished.  No distress.  HEENT: head atraumatic, normocephalic, pupils equal and reactive to light, neck supple,  Cardiovascular: Normal rate, regular rhythm and normal heart sounds.  No murmur heard. No BLE edema. No carotid bruit  Pulmonary/Chest: Effort normal and breath sounds normal. No respiratory distress. Abdominal: Soft.  There is no tenderness. Psychiatric: Patient has a normal mood and affect. behavior is normal. Judgment and thought content normal.   Recent Results (from the past 2160 hour(s))  PSA     Status: None   Collection Time: 02/26/21 12:00 AM  Result Value Ref Range   PSA 1.5      PHQ2/9: Depression screen [facility] 2/9 03/13/2021 02/26/2021 07/18/2020 06/07/2020 07/18/2019  Decreased Interest 0 0 0 0 0  Down, Depressed, Hopeless 0 0 0 0 0  PHQ - 2 Score 0 0 0 0 0  Altered sleeping 0 - - - 0  Tired, decreased energy 0 - - - 1  Change in appetite 0 - - - 0  Feeling bad or failure about yourself  0 - - - 0  Trouble concentrating 0 - - - 0  Moving slowly or fidgety/restless 0 - - - 0  Suicidal thoughts 0 - - - 0  PHQ-9 Score 0 - - - 1  Difficult doing work/chores - - - - Not difficult at all    phq 9 is negative   Fall Risk: Fall Risk  03/13/2021 02/26/2021 07/18/2020 06/07/2020 07/18/2019  Falls in the past year? 0 0 0 0 0  Comment - - - - -  Number falls in past yr: 0 - 0 0 0  Injury with Fall? 0 - 0 0 0  Risk for fall due to : No Fall Risks - - No Fall Risks -  Follow up Falls prevention discussed Falls prevention  discussed - Falls prevention discussed -      Functional Status Survey: Is the patient deaf or have difficulty hearing?: No Does the patient have difficulty seeing, even when wearing glasses/contacts?: No Does the patient have difficulty concentrating, remembering, or making decisions?: No Does the patient have difficulty walking or climbing stairs?: No Does the patient have difficulty dressing or bathing?: No Does the patient have difficulty doing errands alone such as visiting a doctor's office or shopping?: No    Assessment & Plan  1. Atherosclerosis of aorta (HCC)  - rosuvastatin (CRESTOR) 5 MG tablet; Take 1 tablet (5 mg total) by mouth daily.  Dispense: 90 tablet; Refill: 1  2. Coronary artery disease involving native coronary artery of native heart without angina pectoris  - rosuvastatin (CRESTOR) 5 MG tablet; Take 1 tablet (5 mg total) by mouth daily.  Dispense: 90 tablet; Refill: 1  3. Long-term use of high-risk medication  - Hepatic function panel

## 2021-03-13 NOTE — Telephone Encounter (Signed)
Pt is coming in at 1 today

## 2021-03-27 ENCOUNTER — Encounter: Payer: Self-pay | Admitting: Family Medicine

## 2021-03-28 ENCOUNTER — Other Ambulatory Visit: Payer: Self-pay | Admitting: Family Medicine

## 2021-03-28 DIAGNOSIS — Z79899 Other long term (current) drug therapy: Secondary | ICD-10-CM

## 2021-03-28 DIAGNOSIS — I700 Atherosclerosis of aorta: Secondary | ICD-10-CM

## 2021-04-12 DIAGNOSIS — I700 Atherosclerosis of aorta: Secondary | ICD-10-CM | POA: Diagnosis not present

## 2021-04-12 DIAGNOSIS — Z79899 Other long term (current) drug therapy: Secondary | ICD-10-CM | POA: Diagnosis not present

## 2021-04-13 LAB — COMPREHENSIVE METABOLIC PANEL
ALT: 17 IU/L (ref 0–44)
AST: 16 IU/L (ref 0–40)
Albumin/Globulin Ratio: 2.4 — ABNORMAL HIGH (ref 1.2–2.2)
Albumin: 4.3 g/dL (ref 3.7–4.7)
Alkaline Phosphatase: 82 IU/L (ref 44–121)
BUN/Creatinine Ratio: 26 — ABNORMAL HIGH (ref 10–24)
BUN: 22 mg/dL (ref 8–27)
Bilirubin Total: 0.3 mg/dL (ref 0.0–1.2)
CO2: 25 mmol/L (ref 20–29)
Calcium: 9 mg/dL (ref 8.6–10.2)
Chloride: 105 mmol/L (ref 96–106)
Creatinine, Ser: 0.85 mg/dL (ref 0.76–1.27)
Globulin, Total: 1.8 g/dL (ref 1.5–4.5)
Glucose: 106 mg/dL — ABNORMAL HIGH (ref 70–99)
Potassium: 4.4 mmol/L (ref 3.5–5.2)
Sodium: 142 mmol/L (ref 134–144)
Total Protein: 6.1 g/dL (ref 6.0–8.5)
eGFR: 91 mL/min/{1.73_m2} (ref >59)

## 2021-04-13 LAB — NMR, LIPOPROFILE
Cholesterol, Total: 212 mg/dL — ABNORMAL HIGH (ref 100–199)
HDL Particle Number: 43.4 umol/L (ref >=30.5)
HDL-C: 63 mg/dL (ref >39)
LDL Particle Number: 1310 nmol/L — ABNORMAL HIGH (ref <1000)
LDL Size: 21 nm (ref >20.5)
LDL-C (NIH Calc): 126 mg/dL — ABNORMAL HIGH (ref 0–99)
LP-IR Score: 51 — ABNORMAL HIGH (ref <=45)
Small LDL Particle Number: 646 nmol/L — ABNORMAL HIGH (ref <=527)
Triglycerides: 131 mg/dL (ref 0–149)

## 2021-04-15 ENCOUNTER — Encounter: Payer: Self-pay | Admitting: Family Medicine

## 2021-05-23 ENCOUNTER — Telehealth: Payer: Self-pay | Admitting: Acute Care

## 2021-05-23 NOTE — Telephone Encounter (Signed)
Attempted to contact patient to schedule annual LDCT.  No answer and unable to leave VM

## 2021-06-11 ENCOUNTER — Ambulatory Visit (INDEPENDENT_AMBULATORY_CARE_PROVIDER_SITE_OTHER): Payer: Medicare HMO

## 2021-06-11 VITALS — BP 124/80 | HR 55 | Temp 98.1°F | Resp 16 | Ht 71.0 in | Wt 169.1 lb

## 2021-06-11 DIAGNOSIS — Z122 Encounter for screening for malignant neoplasm of respiratory organs: Secondary | ICD-10-CM | POA: Diagnosis not present

## 2021-06-11 DIAGNOSIS — Z0000 Encounter for general adult medical examination without abnormal findings: Secondary | ICD-10-CM | POA: Diagnosis not present

## 2021-06-11 NOTE — Patient Instructions (Signed)
[name] , Thank you for taking time to come for your Medicare Wellness Visit. I appreciate your ongoing commitment to your health goals. Please review the following plan we discussed and let me know if I can assist you in the future.   Screening recommendations/referrals: Colonoscopy: done 11/20/14 Recommended yearly ophthalmology/optometry visit for glaucoma screening and checkup Recommended yearly dental visit for hygiene and checkup  Vaccinations: Influenza vaccine: done 02/26/21 Pneumococcal vaccine: done 08/28/14 Tdap vaccine: done 06/17/11 Shingles vaccine: Shingrix discussed. Please contact your pharmacy for coverage information.  Covid-19: declined  Conditions/risks identified: Keep up the great work!  Next appointment: Follow up in one year for your annual wellness visit.   Preventive Care 75 Years and Older, Male Preventive care refers to lifestyle choices and visits with your health care provider that can promote health and wellness. What does preventive care include? A yearly physical exam. This is also called an annual well check. Dental exams once or twice a year. Routine eye exams. Ask your health care provider how often you should have your eyes checked. Personal lifestyle choices, including: Daily care of your teeth and gums. Regular physical activity. Eating a healthy diet. Avoiding tobacco and drug use. Limiting alcohol use. Practicing safe sex. Taking low doses of aspirin every day. Taking vitamin and mineral supplements as recommended by your health care provider. What happens during an annual well check? The services and screenings done by your health care provider during your annual well check will depend on your age, overall health, lifestyle risk factors, and family history of disease. Counseling  Your health care provider may ask you questions about your: Alcohol use. Tobacco use. Drug use. Emotional well-being. Home and relationship  well-being. Sexual activity. Eating habits. History of falls. Memory and ability to understand (cognition). Work and work Statistician. Screening  You may have the following tests or measurements: Height, weight, and BMI. Blood pressure. Lipid and cholesterol levels. These may be checked every 5 years, or more frequently if you are over 47 years old. Skin check. Lung cancer screening. You may have this screening every year starting at age 30 if you have a 30-pack-year history of smoking and currently smoke or have quit within the past 15 years. Fecal occult blood test (FOBT) of the stool. You may have this test every year starting at age 70. Flexible sigmoidoscopy or colonoscopy. You may have a sigmoidoscopy every 5 years or a colonoscopy every 10 years starting at age 61. Prostate cancer screening. Recommendations will vary depending on your family history and other risks. Hepatitis C blood test. Hepatitis B blood test. Sexually transmitted disease (STD) testing. Diabetes screening. This is done by checking your blood sugar (glucose) after you have not eaten for a while (fasting). You may have this done every 1-3 years. Abdominal aortic aneurysm (AAA) screening. You may need this if you are a current or former smoker. Osteoporosis. You may be screened starting at age 83 if you are at high risk. Talk with your health care provider about your test results, treatment options, and if necessary, the need for more tests. Vaccines  Your health care provider may recommend certain vaccines, such as: Influenza vaccine. This is recommended every year. Tetanus, diphtheria, and acellular pertussis (Tdap, Td) vaccine. You may need a Td booster every 10 years. Zoster vaccine. You may need this after age 58. Pneumococcal 13-valent conjugate (PCV13) vaccine. One dose is recommended after age 65. Pneumococcal polysaccharide (PPSV23) vaccine. One dose is recommended after age 56. Talk  to your health care  provider about which screenings and vaccines you need and how often you need them. This information is not intended to replace advice given to you by your health care provider. Make sure you discuss any questions you have with your health care provider. Document Released: 05/11/2015 Document Revised: 01/02/2016 Document Reviewed: 02/13/2015 Elsevier Interactive Patient Education  2017 [place] Prevention in the Home Falls can cause injuries. They can happen to people of all ages. There are many things you can do to make your home safe and to help prevent falls. What can I do on the outside of my home? Regularly fix the edges of walkways and driveways and fix any cracks. Remove anything that might make you trip as you walk through a door, such as a raised step or threshold. Trim any bushes or trees on the path to your home. Use bright outdoor lighting. Clear any walking paths of anything that might make someone trip, such as rocks or tools. Regularly check to see if handrails are loose or broken. Make sure that both sides of any steps have handrails. Any raised decks and porches should have guardrails on the edges. Have any leaves, snow, or ice cleared regularly. Use sand or salt on walking paths during winter. Clean up any spills in your garage right away. This includes oil or grease spills. What can I do in the bathroom? Use night lights. Install grab bars by the toilet and in the tub and shower. Do not use towel bars as grab bars. Use non-skid mats or decals in the tub or shower. If you need to sit down in the shower, use a plastic, non-slip stool. Keep the floor dry. Clean up any water that spills on the floor as soon as it happens. Remove soap buildup in the tub or shower regularly. Attach bath mats securely with double-sided non-slip rug tape. Do not have throw rugs and other things on the floor that can make you trip. What can I do in the bedroom? Use night lights. Make  sure that you have a light by your bed that is easy to reach. Do not use any sheets or blankets that are too big for your bed. They should not hang down onto the floor. Have a firm chair that has side arms. You can use this for support while you get dressed. Do not have throw rugs and other things on the floor that can make you trip. What can I do in the kitchen? Clean up any spills right away. Avoid walking on wet floors. Keep items that you use a lot in easy-to-reach places. If you need to reach something above you, use a strong step stool that has a grab bar. Keep electrical cords out of the way. Do not use floor polish or wax that makes floors slippery. If you must use wax, use non-skid floor wax. Do not have throw rugs and other things on the floor that can make you trip. What can I do with my stairs? Do not leave any items on the stairs. Make sure that there are handrails on both sides of the stairs and use them. Fix handrails that are broken or loose. Make sure that handrails are as long as the stairways. Check any carpeting to make sure that it is firmly attached to the stairs. Fix any carpet that is loose or worn. Avoid having throw rugs at the top or bottom of the stairs. If you do have throw rugs,  attach them to the floor with carpet tape. Make sure that you have a light switch at the top of the stairs and the bottom of the stairs. If you do not have them, ask someone to add them for you. What else can I do to help prevent falls? Wear shoes that: Do not have high heels. Have rubber bottoms. Are comfortable and fit you well. Are closed at the toe. Do not wear sandals. If you use a stepladder: Make sure that it is fully opened. Do not climb a closed stepladder. Make sure that both sides of the stepladder are locked into place. Ask someone to hold it for you, if possible. Clearly mark and make sure that you can see: Any grab bars or handrails. First and last steps. Where the  edge of each step is. Use tools that help you move around (mobility aids) if they are needed. These include: Canes. Walkers. Scooters. Crutches. Turn on the lights when you go into a dark area. Replace any light bulbs as soon as they burn out. Set up your furniture so you have a clear path. Avoid moving your furniture around. If any of your floors are uneven, fix them. If there are any pets around you, be aware of where they are. Review your medicines with your doctor. Some medicines can make you feel dizzy. This can increase your chance of falling. Ask your doctor what other things that you can do to help prevent falls. This information is not intended to replace advice given to you by your health care provider. Make sure you discuss any questions you have with your health care provider. Document Released: 02/08/2009 Document Revised: 09/20/2015 Document Reviewed: 05/19/2014 Elsevier Interactive Patient Education  2017 [employer].

## 2021-06-11 NOTE — Progress Notes (Signed)
Subjective:   Steven Griffin is a 76 y.o. male who presents for Medicare Annual/Subsequent preventive examination.  Review of Systems     Cardiac Risk Factors include: advanced age (>61men, >23 women);dyslipidemia;male gender;hypertension;smoking/ tobacco exposure     Objective:    Today's Vitals   06/11/21 1051  BP: 124/80  Pulse: (!) 55  Resp: 16  Temp: 98.1 F (36.7 C)  TempSrc: Oral  SpO2: 98%  Weight: 169 lb 1.6 oz (76.7 kg)  Height: 5\' 11"  (1.803 m)   Body mass index is 23.58 kg/m.  Advanced Directives 06/11/2021 06/07/2020 05/20/2019 03/21/2019 05/14/2018 03/03/2017 09/02/2016  Does Patient Have a Medical Advance Directive? Yes Yes Yes Yes Yes No Yes  Type of Paramedic of [place];Living will [place];Living will [place];Living will [place];Living will [place];Living will - [place];Living will  Does patient want to make changes to medical advance directive? - - - - - - -  Copy of [place] in Chart? Yes - validated most recent copy scanned in chart (See row information) Yes - validated most recent copy scanned in chart (See row information) No - copy requested - No - copy requested - No - copy requested  Would patient like information on creating a medical advance directive? - - - - - No - Patient declined -    Current Medications (verified) Outpatient Encounter Medications as of 06/11/2021  Medication Sig   5-Hydroxytryptophan (5-HTP) 100 MG CAPS Take 100 mg by mouth at bedtime.    aspirin EC 81 MG tablet Take 81 mg by mouth daily.   Cholecalciferol (VITAMIN D3 PO) Take 1 capsule by mouth daily.   Coenzyme Q10 (CO Q 10) 100 MG CAPS Take 100 mg by mouth daily.   diltiazem (CARDIZEM) 30 MG tablet Take 1 tablet (30 mg total) by mouth every 6 (six) hours as needed (palpitations).   diphenhydrAMINE (BENADRYL) 25 MG tablet Take 25  mg by mouth at bedtime as needed.   finasteride (PROSCAR) 5 MG tablet Take 1 tablet (5 mg total) by mouth daily.   Flaxseed, Linseed, (FLAXSEED OIL PO) Take 1 each by mouth See admin instructions. Take 15 ml by mouth daily   Green Tea, Camellia sinensis, (GREEN TEA EXTRACT PO) Take by mouth.   Homeopathic Products (LEG CRAMP RELIEF PO) Take 2 tablets by mouth at bedtime.   L-Theanine 100 MG CAPS Take 100 mg by mouth 2 (two) times daily.   LITHIUM PO Take 1,000 mcg by mouth. From life extension foundation   MAGNESIUM PO Take 300 mg by mouth at bedtime.    Melatonin 3 MG CAPS Take 3 mg by mouth at bedtime.    Misc Natural Products (BETA-SITOSTEROL PLANT STEROLS) CAPS Take 1 capsule by mouth daily.   Misc Natural Products (GLUCOSAMINE CHOND MSM FORMULA PO) Take 2 capsules by mouth 2 (two) times daily.   Multiple Vitamin (MULTIVITAMIN) tablet Take 1 tablet by mouth daily.   NON FORMULARY Take 1 tablet by mouth daily. AMPK Supplement   Omega-3 Fatty Acids (FISH OIL) 1000 MG CAPS Take 1,000 mg by mouth daily.    OVER THE COUNTER MEDICATION Take 1 capsule by mouth daily. Bacopa Supplement   OVER THE COUNTER MEDICATION Neuromag   OVER THE COUNTER MEDICATION astaxanthim   OVER THE COUNTER MEDICATION Macuguard for eyes   Polyvinyl Alcohol-Povidone (REFRESH OP) Place 2 drops into both eyes as needed (for  dry eyes).   Probiotic Product (PROBIOTIC DAILY PO) Take 1 capsule by mouth daily.    tamsulosin (FLOMAX) 0.4 MG CAPS capsule Take 1 capsule (0.4 mg total) by mouth daily.   Turmeric 500 MG CAPS Take by mouth.   zinc gluconate 50 MG tablet Take 50 mg by mouth daily.   rosuvastatin (CRESTOR) 5 MG tablet Take 1 tablet (5 mg total) by mouth daily. (Patient not taking: Reported on 06/11/2021)   No facility-administered encounter medications on file as of 06/11/2021.    Allergies (verified) Patient has no known allergies.   History: Past Medical History:  Diagnosis Date   Allergic rhinitis, cause  unspecified    Atrial fibrillation (HCC)    CHF (congestive heart failure) (HCC)    COPD (chronic obstructive pulmonary disease) ([place])    pt denies   Diverticulosis of colon (without mention of hemorrhage)    Essential hypertension, benign    pt denies   Hyperlipidemia    Impotence of organic origin    Mild anxiety    Personal history of COVID-19    Skin cancer    Unspecified venous (peripheral) insufficiency    Past Surgical History:  Procedure Laterality Date   ATRIAL FIBRILLATION ABLATION N/A 03/03/2017   Procedure: Atrial Fibrillation Ablation;  Surgeon: [name], MD;  Location: [facility];  Service: Cardiovascular;  Laterality: N/A;   COLONOSCOPY WITH PROPOFOL N/A 11/20/2014   Procedure: COLONOSCOPY WITH PROPOFOL;  Surgeon: Manya Silvas, MD;  Location[facility] ENDOSCOPY;  Service: Endoscopy;  Laterality: N/A;   rib cartilage     benign tumor   SKIN CANCER EXCISION Right 06/2010   lower leg   TONSILLECTOMY AND ADENOIDECTOMY     VEIN LIGATION AND STRIPPING     Family History  Problem Relation Age of Onset   Suicidality Mother    Leukemia Father    Hypercholesterolemia Father    Hypertension Brother    Hyperlipidemia Brother    Cirrhosis Brother    Colon cancer Maternal Grandmother    Social History   Socioeconomic History   Marital status: Married    Spouse name: Neoma Laming   Number of children: 3   Years of education: 14   Highest education level: Not on file  Occupational History   Occupation: Product manager  Tobacco Use   Smoking status: Every Day    Packs/day: 1.00    Years: 35.00    Pack years: 35.00    Types: Cigarettes, Cigars   Smokeless tobacco: Never   Tobacco comments:    patient uses natural tobacco (rolls his own)  Vaping Use   Vaping Use: Never used  Substance and Sexual Activity   Alcohol use: Yes    Alcohol/week: 1.0 standard drink    Types: 1 Glasses of wine per week    Comment: 2-3 glasses of wine at night   Drug  use: No   Sexual activity: Yes    Partners: Female    Birth control/protection: None  Other Topics Concern   Not on file  Social History Narrative   Pt lives in [place] with spouse.  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   Social Determinants of Health   Financial Resource Strain: Low Risk    Difficulty of Paying Living Expenses: Not hard at all  Food Insecurity: No Food Insecurity   Worried About Charity fundraiser in the Last Year: Never true   [place] in the Last  Year: Never true  Transportation Needs: No Transportation Needs   Lack of Transportation (Medical): No   Lack of Transportation (Non-Medical): No  Physical Activity: Sufficiently Active   Days of Exercise per Week: 7 days   Minutes of Exercise per Session: 60 min  Stress: No Stress Concern Present   Feeling of Stress : Not at all  Social Connections: Socially Integrated   Frequency of Communication with Friends and Family: More than three times a week   Frequency of Social Gatherings with Friends and Family: Twice a week   Attends Religious Services: More than 4 times per year   Active Member of [employer] or Organizations: Yes   Attends Music therapist: More than 4 times per year   Marital Status: Married    Tobacco Counseling Ready to quit: No Counseling given: Not Answered Tobacco comments: patient uses natural tobacco (rolls his own)   Clinical Intake:  Pre-visit preparation completed: Yes  Pain : No/denies pain     BMI - recorded: 23.58 Nutritional Status: BMI of 19-24  Normal Nutritional Risks: None Diabetes: No  How often do you need to have someone help you when you read instructions, pamphlets, or other written materials from your doctor or pharmacy?: 1 - Never   Interpreter Needed?: No  Information entered by :: Clemetine Marker LPN   Activities of Daily Living In your present state of health, do you have any difficulty performing the following  activities: 06/11/2021 03/13/2021  Hearing? N N  Vision? N N  Difficulty concentrating or making decisions? N N  Walking or climbing stairs? N N  Dressing or bathing? N N  Doing errands, shopping? N N  Preparing Food and eating ? N -  Using the Toilet? N -  In the past six months, have you accidently leaked urine? N -  Do you have problems with loss of bowel control? N -  Managing your Medications? N -  Managing your Finances? N -  Housekeeping or managing your Housekeeping? N -  Some recent data might be hidden    Patient Care Team: Steele Sizer, MD as PCP - General (Family Medicine) Wellington Hampshire, MD as Consulting Physician (Cardiology) [name], MD as Consulting Physician (Cardiology) Ree Edman, MD (Dermatology) [facility], [facility] Margaretha Sheffield, MD (Otolaryngology)  Indicate any recent Medical Services you may have received from other than [facility] providers in the past year (date may be approximate).     Assessment:   This is a routine wellness examination for [place].  Hearing/Vision screen Hearing Screening - Comments:: Pt denies hearing difficulty  Vision Screening - Comments:: Vision screenings done by [facility]  Dietary issues and exercise activities discussed: Current Exercise Habits: Home exercise routine, Type of exercise: strength training/weights;stretching;Other - see comments (cardio), Time (Minutes): 60, Frequency (Times/Week): 7, Weekly Exercise (Minutes/Week): 420, Intensity: Moderate, Exercise limited by: None identified   Goals Addressed             This Visit's Progress    Patient Stated   On track    Maintain active and healthy lifestyle       Depression Screen PHQ 2/9 Scores 06/11/2021 03/13/2021 02/26/2021 07/18/2020 06/07/2020 07/18/2019 05/20/2019  PHQ - 2 Score 0 0 0 0 0 0 0  PHQ- 9 Score - 0 - - - 1 0    Fall Risk Fall Risk  06/11/2021 03/13/2021 02/26/2021 07/18/2020 06/07/2020  Falls in the past year? 0  0 0 0 0  Comment - - - - -  Number falls in past yr: 0 0 - 0 0  Injury with Fall? 0 0 - 0 0  Risk for fall due to : No Fall Risks No Fall Risks - - No Fall Risks  Follow up Falls prevention discussed Falls prevention discussed Falls prevention discussed - Falls prevention discussed    FALL RISK PREVENTION PERTAINING TO THE HOME:  Any stairs in or around the home? Yes  If so, are there any without handrails? No  Home free of loose throw rugs in walkways, pet beds, electrical cords, etc? Yes  Adequate lighting in your home to reduce risk of falls? Yes   ASSISTIVE DEVICES UTILIZED TO PREVENT FALLS:  Life alert? No  Use of a cane, walker or w/c? No  Grab bars in the bathroom? No  Shower chair or bench in shower? No  Elevated toilet seat or a handicapped toilet? No   TIMED UP AND GO:  Was the test performed? Yes .  Length of time to ambulate 10 feet: 5 sec.   Gait steady and fast without use of assistive device  Cognitive Function:     6CIT Screen 05/14/2018  What Year? 0 points  What month? 0 points  What time? 0 points  Count back from 20 0 points  Months in reverse 0 points  Repeat phrase 0 points  Total Score 0    Immunizations Immunization History  Administered Date(s) Administered   Fluad Quad(high Dose 65+) 02/26/2021   Influenza Split 03/22/2008   Influenza, High Dose Seasonal PF 03/10/2018, 03/07/2019   Influenza, Seasonal, Injecte, Preservative Fre 02/27/2012, 07/12/2012   Influenza-Unspecified 03/23/2015, 04/01/2016, 02/16/2017   Pneumococcal Conjugate-13 08/28/2014   Pneumococcal Polysaccharide-23 08/03/2012   Td 06/17/2011   Zoster, Live 03/28/2010    TDAP status: Up to date  Flu Vaccine status: Up to date  Pneumococcal vaccine status: Up to date  Covid-19 vaccine status: Declined, Education has been provided regarding the importance of this vaccine but patient still declined. Advised may receive this vaccine at local pharmacy or Health Dept.or  vaccine clinic. Aware to provide a copy of the vaccination record if obtained from local pharmacy or Health Dept. Verbalized acceptance and understanding.  Qualifies for Shingles Vaccine? Yes   Zostavax completed Yes   Shingrix Completed?: No.    Education has been provided regarding the importance of this vaccine. Patient has been advised to call insurance company to determine out of pocket expense if they have not yet received this vaccine. Advised may also receive vaccine at local pharmacy or Health Dept. Verbalized acceptance and understanding.  Screening Tests Health Maintenance  Topic Date Due   Zoster Vaccines- Shingrix (1 of 2) Never done   COVID-19 Vaccine (1) 06/27/2021 (Originally 12/14/1945)   TETANUS/TDAP  06/16/2021   COLONOSCOPY (Pts 45-48yrs Insurance coverage will need to be confirmed)  11/19/2024   Pneumonia Vaccine 83+ Years old  Completed   INFLUENZA VACCINE  Completed   Hepatitis C Screening  Completed   HPV VACCINES  Aged Out    Health Maintenance  Health Maintenance Due  Topic Date Due   Zoster Vaccines- Shingrix (1 of 2) Never done    Colorectal cancer screening: Type of screening: Colonoscopy. Completed 11/20/14. Repeat every 10 years  Lung Cancer Screening: (Low Dose CT Chest recommended if Age 15-80 years, 30 pack-year currently smoking OR have quit w/in 15years.) does qualify. Completed 11/28/19  Lung Cancer Screening Referral: A referral has been sent to [facility] Lung Cancer Screening regarding the possible  need for this exam. The patient's chart will be reviewed to determine if they qualify and the patient will be contacted to facilitate the scheduling of the Low Dose Chest CT for lung cancer screening.    Additional Screening:  Hepatitis C Screening: does qualify; Completed 08/31/15  Vision Screening: Recommended annual ophthalmology exams for early detection of glaucoma and other disorders of the eye. Is the patient up to date with their annual  eye exam?  Yes  Who is the provider or what is the name of the office in which the patient attends annual eye exams? [facility].   Dental Screening: Recommended annual dental exams for proper oral hygiene  Community Resource Referral / Chronic Care Management: CRR required this visit?  No  CCM required this visit?  No      Plan:     I have personally reviewed and noted the following in the patients chart:   Medical and social history Use of alcohol, tobacco or illicit drugs  Current medications and supplements including opioid prescriptions. Patient is not currently taking opioid prescriptions. Functional ability and status Nutritional status Physical activity Advanced directives List of other physicians Hospitalizations, surgeries, and ER visits in previous 12 months Vitals Screenings to include cognitive, depression, and falls Referrals and appointments  In addition, I have reviewed and discussed with patient certain preventive protocols, quality metrics, and best practice recommendations. A written personalized care plan for preventive services as well as general preventive health recommendations were provided to patient.     Clemetine Marker, LPN   [id number]/74/[id number]   Nurse Notes: pt is not currently taking crestor; has adjusted diet and would like to recheck labwork

## 2021-06-14 ENCOUNTER — Other Ambulatory Visit: Payer: Self-pay | Admitting: Family Medicine

## 2021-06-14 DIAGNOSIS — N138 Other obstructive and reflux uropathy: Secondary | ICD-10-CM

## 2021-06-15 NOTE — Telephone Encounter (Signed)
Requested Prescriptions  Pending Prescriptions Disp Refills   finasteride (PROSCAR) 5 MG tablet [Pharmacy Med Name: FINASTERIDE 5 MG TABLET] 90 tablet 0    Sig: TAKE 1 TABLET (5 MG TOTAL) BY MOUTH DAILY.     Urology: 5-alpha Reductase Inhibitors Passed - 06/14/2021  6:41 PM      Passed - PSA in normal range and within 360 days    PSA  Date Value Ref Range Status  02/26/2021 1.5  Final   Prostate Specific Ag, Serum  Date Value Ref Range Status  08/31/2015 4.6 (H) 0.0 - 4.0 ng/mL Final    Comment:    Roche ECLIA methodology. According to the American Urological Association, Serum PSA should decrease and remain at undetectable levels after radical prostatectomy. The AUA defines biochemical recurrence as an initial PSA value 0.2 ng/mL or greater followed by a subsequent confirmatory PSA value 0.2 ng/mL or greater. Values obtained with different assay methods or kits cannot be used interchangeably. Results cannot be interpreted as absolute evidence of the presence or absence of malignant disease.          Passed - Valid encounter within last 12 months    Recent Outpatient Visits          3 months ago Atherosclerosis of aorta [facility])   [facility] Steele Sizer, MD   3 months ago Paroxysmal atrial fibrillation [facility])   [facility] Steele Sizer, MD   11 months ago Well adult exam   [facility] Steele Sizer, MD   1 year ago Paroxysmal atrial fibrillation [facility])   [facility] Steele Sizer, MD   1 year ago Well adult exam   [facility] Steele Sizer, MD      Future Appointments            In 1 month Ancil Boozer, Drue Stager, MD [facility], [place]   In 12 months  [facility], [facility]

## 2021-06-25 DIAGNOSIS — M9902 Segmental and somatic dysfunction of thoracic region: Secondary | ICD-10-CM | POA: Diagnosis not present

## 2021-06-25 DIAGNOSIS — M9901 Segmental and somatic dysfunction of cervical region: Secondary | ICD-10-CM | POA: Diagnosis not present

## 2021-06-25 DIAGNOSIS — M9903 Segmental and somatic dysfunction of lumbar region: Secondary | ICD-10-CM | POA: Diagnosis not present

## 2021-06-25 DIAGNOSIS — M531 Cervicobrachial syndrome: Secondary | ICD-10-CM | POA: Diagnosis not present

## 2021-06-25 DIAGNOSIS — M546 Pain in thoracic spine: Secondary | ICD-10-CM | POA: Diagnosis not present

## 2021-06-25 DIAGNOSIS — M5137 Other intervertebral disc degeneration, lumbosacral region: Secondary | ICD-10-CM | POA: Diagnosis not present

## 2021-07-19 NOTE — Progress Notes (Signed)
Name: Steven Griffin   MRN: [id number]    DOB: 15-Aug-1945   Date:07/22/2021 ? ?     Progress Note ? ?Subjective ? ?Chief Complaint ? ?Annual Exam ? ?HPI ? ?Patient presents for annual CPE . ? ?IPSS Questionnaire (AUA-7): ?Over the past month?   ?1)  How often have you had a sensation of not emptying your bladder completely after you finish urinating?  0 - Not at all  ?2)  How often have you had to urinate again less than two hours after you finished urinating? 0 - Not at all  ?3)  How often have you found you stopped and started again several times when you urinated?  0 - Not at all  ?4) How difficult have you found it to postpone urination?  0 - Not at all  ?5) How often have you had a weak urinary stream?  1 - Less than 1 time in 5  ?6) How often have you had to push or strain to begin urination?  0 - Not at all  ?7) How many times did you most typically get up to urinate from the time you went to bed until the time you got up in the morning?  0 - None  ?Total score:  0-7 mildly symptomatic  ? 8-19 moderately symptomatic  ? 20-35 severely symptomatic  ?  ? ?Diet: he eliminated sugar from his diet, lost 5 lbs. Trying to improve his lipid panel  ?Exercise: continue regular physical activity  ? ?Depression: phq 9 is negative ? ?  07/22/2021  ? 10:29 AM 06/11/2021  ? 10:57 AM 03/13/2021  ?  1:11 PM 02/26/2021  ? 11:03 AM 07/18/2020  ?  9:54 AM  ?Depression screen PHQ 2/9  ?Decreased Interest 0 0 0 0 0  ?Down, Depressed, Hopeless 0 0 0 0 0  ?PHQ - 2 Score 0 0 0 0 0  ?Altered sleeping 0  0    ?Tired, decreased energy 0  0    ?Change in appetite 0  0    ?Feeling bad or failure about yourself  0  0    ?Trouble concentrating 2  0    ?Moving slowly or fidgety/restless 0  0    ?Suicidal thoughts 0  0    ?PHQ-9 Score 2  0    ? ? ?Hypertension:  ?BP Readings from Last 3 Encounters:  ?07/22/21 122/74  ?06/11/21 124/80  ?03/13/21 134/76  ? ? ?Obesity: ?Wt Readings from Last 3 Encounters:  ?07/22/21 164 lb (74.4 kg)  ?06/11/21 169 lb  1.6 oz (76.7 kg)  ?03/13/21 168 lb (76.2 kg)  ? ?BMI Readings from Last 3 Encounters:  ?07/22/21 23.53 kg/m?  ?06/11/21 23.58 kg/m?  ?03/13/21 23.43 kg/m?  ?  ? ?Lipids:  ?Lab Results  ?Component Value Date  ? CHOL 219 (A) 08/26/2020  ? CHOL 204 (A) 09/14/2019  ? CHOL 185 09/02/2018  ? ?Lab Results  ?Component Value Date  ? HDL 64 08/26/2020  ? HDL 63 09/14/2019  ? HDL 55 09/02/2018  ? ?Lab Results  ?Component Value Date  ? [place] 138 08/26/2020  ? [place] 118 09/14/2019  ? [place] 108 09/02/2018  ? ?Lab Results  ?Component Value Date  ? TRIG 97 08/26/2020  ? TRIG 131 09/14/2019  ? TRIG 112 09/02/2018  ? ?Lab Results  ?Component Value Date  ? CHOLHDL 3.0 08/31/2015  ? ?No results found for: LDLDIRECT ?Glucose:  ?Glucose  ?Date Value Ref Range Status  ?04/12/2021 106 (  H) 70 - 99 mg/dL Final  ?02/20/2017 92 65 - 99 mg/dL Final  ?12/18/2016 85 65 - 99 mg/dL Final  ? ? ?[place] [facility] Visit from 07/22/2021 in [facility]  ?AUDIT-C Score 4  ? ?  ? ? ?Married ?STD testing and prevention (HIV/chl/gon/syphilis):  N/A ?Hep C Screening: 08/31/15 ?Skin cancer: Discussed monitoring for atypical lesions ?Colorectal cancer: 11/20/14 ? ?Prostate cancer:   ?Lab Results  ?Component Value Date  ? PSA 1.5 02/26/2021  ? PSA 2.3 09/14/2019  ? PSA 2.2 09/02/2018  ? ? ?Lung cancer:  Low Dose CT Chest recommended if Age 12-80 years, 30 pack-year currently smoking OR have quit w/in 15years. Patient  no ?AAA: The USPSTF recommends one-time screening with ultrasonography in men ages 50 to 32 years who have ever smoked. Patient:  no ?ECG:  07/02/20 ? ?Vaccines:  ? ?HPV: N/A ?Tdap: 06/17/11, I will send rx to pharmacy  ?Shingrix: sending rx to local pharmacy  ?Pneumonia: up to date ?Flu: up to date ?COVID-19: refused  ? ?Advanced Care Planning: A voluntary discussion about advance care planning including the explanation and discussion of advance directives.  Discussed health care proxy and Living will, and the patient was  able to identify a health care proxy as wife.  Patient has living and power of attorney of health care  ? ?Patient Active Problem List  ? Diagnosis Date Noted  ? DDD (degenerative disc disease), lumbosacral 02/26/2021  ? Benign prostatic hyperplasia with urinary obstruction 02/26/2021  ? Centrilobular emphysema ([place]) 02/26/2021  ? Coronary artery disease involving native coronary artery of native heart without angina pectoris 12/13/2019  ? Atherosclerosis of aorta ([place]) 12/13/2019  ? Senile purpura ([place]) 12/13/2019  ? Personal history of tobacco use, presenting hazards to health 11/25/2018  ? Essential hypertension, benign 02/23/2018  ? Superficial thrombophlebitis 02/23/2018  ? Phlebitis of left lower extremity 01/26/2018  ? Paroxysmal atrial fibrillation ([place]) 03/03/2017  ? Bradycardia 08/31/2015  ? Muscle tear 04/20/2015  ? Allergic rhinitis, seasonal 04/20/2015  ? Chronic venous insufficiency 10/27/2014  ? Intermittent tremor 10/27/2014  ? ED (erectile dysfunction) of non-organic origin 10/27/2014  ? Colon, diverticulosis 10/27/2014  ? Benign prostatic hypertrophy without urinary obstruction 10/27/2014  ? Bundle branch block, right and left anterior fascicular 06/07/2013  ? Atrial fibrillation ([place]) 10/20/2012  ? Obstructive apnea 03/23/2012  ? Polypharmacy 04/15/2011  ? On dofetilide therapy 04/15/2011  ? Hypercholesteremia 01/18/2011  ? H/O squamous cell carcinoma of skin 01/18/2011  ? History of major vascular surgery 01/18/2011  ? ? ?Past Surgical History:  ?Procedure Laterality Date  ? ATRIAL FIBRILLATION ABLATION N/A 03/03/2017  ? Procedure: Atrial Fibrillation Ablation;  Surgeon: [name], MD;  Location: [place] [facility] LAB;  Service: Cardiovascular;  Laterality: N/A;  ? COLONOSCOPY WITH PROPOFOL N/A 11/20/2014  ? Procedure: COLONOSCOPY WITH PROPOFOL;  Surgeon: Manya Silvas, MD;  Location: [facility] ENDOSCOPY;  Service: Endoscopy;  Laterality: N/A;  ? rib cartilage    ? benign tumor  ? SKIN CANCER  EXCISION Right 06/2010  ? lower leg  ? TONSILLECTOMY AND ADENOIDECTOMY    ? VEIN LIGATION AND STRIPPING    ? ? ?Family History  ?Problem Relation Age of Onset  ? Suicidality Mother   ? Leukemia Father   ? Hypercholesterolemia Father   ? Hypertension Brother   ? Hyperlipidemia Brother   ? Cirrhosis Brother   ? Colon cancer Maternal Grandmother   ? ? ?Social History  ? ?Socioeconomic History  ? Marital  status: Married  ?  Spouse name: Neoma Laming  ? Number of children: 3  ? Years of education: 27  ? Highest education level: Not on file  ?Occupational History  ? Occupation: Product manager  ?Tobacco Use  ? Smoking status: Every Day  ?  Packs/day: 1.00  ?  Years: 35.00  ?  Pack years: 35.00  ?  Types: Cigarettes, Cigars  ? Smokeless tobacco: Never  ? Tobacco comments:  ?  patient uses natural tobacco (rolls his own)  ?Vaping Use  ? Vaping Use: Never used  ?Substance and Sexual Activity  ? Alcohol use: Yes  ?  Alcohol/week: 1.0 standard drink  ?  Types: 1 Glasses of wine per week  ?  Comment: 2-3 glasses of wine at night  ? Drug use: No  ? Sexual activity: Yes  ?  Partners: Female  ?  Birth control/protection: None  ?Other Topics Concern  ? Not on file  ?Social History Narrative  ? Pt lives in [place] with spouse.  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  ? ?Social Determinants of Health  ? ?Financial Resource Strain: Low Risk   ? Difficulty of Paying Living Expenses: Not hard at all  ?Food Insecurity: No Food Insecurity  ? Worried About Charity fundraiser in the Last Year: Never true  ? Ran Out of Food in the Last Year: Never true  ?Transportation Needs: No Transportation Needs  ? Lack of Transportation (Medical): No  ? Lack of Transportation (Non-Medical): No  ?Physical Activity: Sufficiently Active  ? Days of Exercise per Week: 5 days  ? Minutes of Exercise per Session: 50 min  ?Stress: No Stress Concern Present  ? Feeling of Stress : Not at all  ?Social Connections: Socially Integrated  ?  Frequency of Communication with Friends and Family: More than three times a week  ? Frequency of Social Gatherings with Friends and Family: Twice a week  ? Attends Religious Services: More than 4 times per year  ?

## 2021-07-19 NOTE — Patient Instructions (Signed)
Preventive Care 65 Years and Older, Male °Preventive care refers to lifestyle choices and visits with your health care provider that can promote health and wellness. Preventive care visits are also called wellness exams. °What can I expect for my preventive care visit? °Counseling °During your preventive care visit, your health care provider may ask about your: °Medical history, including: °Past medical problems. °Family medical history. °History of falls. °Current health, including: °Emotional well-being. °Home life and relationship well-being. °Sexual activity. °Memory and ability to understand (cognition). °Lifestyle, including: °Alcohol, nicotine or tobacco, and drug use. °Access to firearms. °Diet, exercise, and sleep habits. °Work and work environment. °Sunscreen use. °Safety issues such as seatbelt and bike helmet use. °Physical exam °Your health care provider will check your: °Height and weight. These may be used to calculate your BMI (body mass index). BMI is a measurement that tells if you are at a healthy weight. °Waist circumference. This measures the distance around your waistline. This measurement also tells if you are at a healthy weight and may help predict your risk of certain diseases, such as type 2 diabetes and high blood pressure. °Heart rate and blood pressure. °Body temperature. °Skin for abnormal spots. °What immunizations do I need? °Vaccines are usually given at various ages, according to a schedule. Your health care provider will recommend vaccines for you based on your age, medical history, and lifestyle or other factors, such as travel or where you work. °What tests do I need? °Screening °Your health care provider may recommend screening tests for certain conditions. This may include: °Lipid and cholesterol levels. °Diabetes screening. This is done by checking your blood sugar (glucose) after you have not eaten for a while (fasting). °Hepatitis C test. °Hepatitis B test. °HIV (human  immunodeficiency virus) test. °STI (sexually transmitted infection) testing, if you are at risk. °Lung cancer screening. °Colorectal cancer screening. °Prostate cancer screening. °Abdominal aortic aneurysm (AAA) screening. You may need this if you are a current or former smoker. °Talk with your health care provider about your test results, treatment options, and if necessary, the need for more tests. °Follow these instructions at home: °Eating and drinking ° °Eat a diet that includes fresh fruits and vegetables, whole grains, lean protein, and low-fat dairy products. Limit your intake of foods with high amounts of sugar, saturated fats, and salt. °Take vitamin and mineral supplements as recommended by your health care provider. °Do not drink alcohol if your health care provider tells you not to drink. °If you drink alcohol: °Limit how much you have to 0-2 drinks a day. °Know how much alcohol is in your drink. In the [place] one drink equals one 12 oz bottle of beer (355 mL), one 5 oz glass of wine (148 mL), or one 1½ oz glass of hard liquor (44 mL). °Lifestyle °Brush your teeth every morning and night with fluoride toothpaste. Floss one time each day. °Exercise for at least 30 minutes 5 or more days each week. °Do not use any products that contain nicotine or tobacco. These products include cigarettes, chewing tobacco, and vaping devices, such as e-cigarettes. If you need help quitting, ask your health care provider. °Do not use drugs. °If you are sexually active, practice safe sex. Use a condom or other form of protection to prevent STIs. °Take aspirin only as told by your health care provider. Make sure that you understand how much to take and what form to take. Work with your health care provider to find out whether it is safe and   beneficial for you to take aspirin daily. °Ask your health care provider if you need to take a cholesterol-lowering medicine (statin). °Find healthy ways to manage stress, such  as: °Meditation, yoga, or listening to music. °Journaling. °Talking to a trusted person. °Spending time with friends and family. °Safety °Always wear your seat belt while driving or riding in a vehicle. °Do not drive: °If you have been drinking alcohol. Do not ride with someone who has been drinking. °When you are tired or distracted. °While texting. °If you have been using any mind-altering substances or drugs. °Wear a helmet and other protective equipment during sports activities. °If you have firearms in your house, make sure you follow all gun safety procedures. °Minimize exposure to UV radiation to reduce your risk of skin cancer. °What's next? °Visit your health care provider once a year for an annual wellness visit. °Ask your health care provider how often you should have your eyes and teeth checked. °Stay up to date on all vaccines. °This information is not intended to replace advice given to you by your health care provider. Make sure you discuss any questions you have with your health care provider. °Document Revised: 10/10/2020 Document Reviewed: 10/10/2020 °Elsevier Patient Education © 2022 Elsevier Inc. ° °

## 2021-07-22 ENCOUNTER — Ambulatory Visit (INDEPENDENT_AMBULATORY_CARE_PROVIDER_SITE_OTHER): Payer: Medicare HMO | Admitting: Family Medicine

## 2021-07-22 ENCOUNTER — Encounter: Payer: Self-pay | Admitting: Family Medicine

## 2021-07-22 VITALS — BP 122/74 | HR 81 | Resp 16 | Ht 70.0 in | Wt 164.0 lb

## 2021-07-22 DIAGNOSIS — N138 Other obstructive and reflux uropathy: Secondary | ICD-10-CM

## 2021-07-22 DIAGNOSIS — Z23 Encounter for immunization: Secondary | ICD-10-CM | POA: Diagnosis not present

## 2021-07-22 DIAGNOSIS — N401 Enlarged prostate with lower urinary tract symptoms: Secondary | ICD-10-CM

## 2021-07-22 DIAGNOSIS — Z0000 Encounter for general adult medical examination without abnormal findings: Secondary | ICD-10-CM | POA: Diagnosis not present

## 2021-07-22 MED ORDER — TETANUS-DIPHTH-ACELL PERTUSSIS 5-2-15.5 LF-MCG/0.5 IM SUSP: 0.5000 mL | Freq: Once | INTRAMUSCULAR | 0 refills | Status: AC

## 2021-07-22 MED ORDER — SHINGRIX 50 MCG/0.5ML IM SUSR: 0.5000 mL | Freq: Once | INTRAMUSCULAR | 1 refills | Status: AC

## 2021-07-31 ENCOUNTER — Other Ambulatory Visit: Payer: Self-pay

## 2021-07-31 DIAGNOSIS — F17210 Nicotine dependence, cigarettes, uncomplicated: Secondary | ICD-10-CM

## 2021-07-31 DIAGNOSIS — Z87891 Personal history of nicotine dependence: Secondary | ICD-10-CM

## 2021-07-31 DIAGNOSIS — Z122 Encounter for screening for malignant neoplasm of respiratory organs: Secondary | ICD-10-CM

## 2021-07-31 NOTE — Telephone Encounter (Signed)
Returned call to patient to schedule annual LDCT. No answer. Left VM with call back number ?

## 2021-08-12 ENCOUNTER — Encounter: Payer: Self-pay | Admitting: Family Medicine

## 2021-08-13 ENCOUNTER — Ambulatory Visit
Admission: RE | Admit: 2021-08-13 | Discharge: 2021-08-13 | Disposition: A | Discharge status: Home / Self Care | Payer: Medicare HMO | Source: Ambulatory Visit | Attending: Acute Care | Admitting: Acute Care

## 2021-08-13 ENCOUNTER — Other Ambulatory Visit: Payer: Self-pay

## 2021-08-13 DIAGNOSIS — F17210 Nicotine dependence, cigarettes, uncomplicated: Secondary | ICD-10-CM | POA: Insufficient documentation

## 2021-08-13 DIAGNOSIS — I700 Atherosclerosis of aorta: Secondary | ICD-10-CM | POA: Insufficient documentation

## 2021-08-13 DIAGNOSIS — Z122 Encounter for screening for malignant neoplasm of respiratory organs: Secondary | ICD-10-CM | POA: Diagnosis not present

## 2021-08-13 DIAGNOSIS — I2510 Atherosclerotic heart disease of native coronary artery without angina pectoris: Secondary | ICD-10-CM | POA: Diagnosis not present

## 2021-08-13 DIAGNOSIS — R69 Illness, unspecified: Secondary | ICD-10-CM | POA: Diagnosis not present

## 2021-08-13 DIAGNOSIS — J439 Emphysema, unspecified: Secondary | ICD-10-CM | POA: Insufficient documentation

## 2021-08-13 DIAGNOSIS — Z87891 Personal history of nicotine dependence: Secondary | ICD-10-CM

## 2021-08-14 DIAGNOSIS — M9901 Segmental and somatic dysfunction of cervical region: Secondary | ICD-10-CM | POA: Diagnosis not present

## 2021-08-14 DIAGNOSIS — M955 Acquired deformity of pelvis: Secondary | ICD-10-CM | POA: Diagnosis not present

## 2021-08-14 DIAGNOSIS — M5442 Lumbago with sciatica, left side: Secondary | ICD-10-CM | POA: Diagnosis not present

## 2021-08-14 DIAGNOSIS — M9905 Segmental and somatic dysfunction of pelvic region: Secondary | ICD-10-CM | POA: Diagnosis not present

## 2021-08-14 DIAGNOSIS — M531 Cervicobrachial syndrome: Secondary | ICD-10-CM | POA: Diagnosis not present

## 2021-08-14 DIAGNOSIS — M9903 Segmental and somatic dysfunction of lumbar region: Secondary | ICD-10-CM | POA: Diagnosis not present

## 2021-08-15 ENCOUNTER — Other Ambulatory Visit: Payer: Self-pay | Admitting: Family Medicine

## 2021-08-15 ENCOUNTER — Telehealth: Payer: Self-pay | Admitting: Acute Care

## 2021-08-15 DIAGNOSIS — F17210 Nicotine dependence, cigarettes, uncomplicated: Secondary | ICD-10-CM

## 2021-08-15 DIAGNOSIS — Z122 Encounter for screening for malignant neoplasm of respiratory organs: Secondary | ICD-10-CM

## 2021-08-15 DIAGNOSIS — Z87891 Personal history of nicotine dependence: Secondary | ICD-10-CM

## 2021-08-15 NOTE — Telephone Encounter (Signed)
Spoke with patient by phone to review results of recent LDCT.  As per previous CT, notations of atherosclerosis and emphysema.  Patient states he 'works hard to eat well and exercise.'  New notation of enlarged pulmonic trunk.  Patient states his blood pressure is usually good but on occasion it goes up.  He is followed by cardiology. Results routed to PCP and cardiologist per patient request.  Patient had no further questions and acknowledged understanding of results.  Next scan will be in one year.   ?

## 2021-08-27 ENCOUNTER — Telehealth: Payer: Self-pay

## 2021-08-27 DIAGNOSIS — I272 Pulmonary hypertension, unspecified: Secondary | ICD-10-CM

## 2021-08-27 NOTE — Telephone Encounter (Signed)
Echo and f/u scheduled. ?

## 2021-09-05 ENCOUNTER — Ambulatory Visit (HOSPITAL_BASED_OUTPATIENT_CLINIC_OR_DEPARTMENT_OTHER): Payer: Medicare HMO | Admitting: Internal Medicine

## 2021-09-05 ENCOUNTER — Ambulatory Visit (INDEPENDENT_AMBULATORY_CARE_PROVIDER_SITE_OTHER): Payer: Medicare HMO

## 2021-09-05 ENCOUNTER — Encounter (HOSPITAL_BASED_OUTPATIENT_CLINIC_OR_DEPARTMENT_OTHER): Payer: Self-pay | Admitting: Internal Medicine

## 2021-09-05 VITALS — BP 126/84 | HR 56 | Ht 70.0 in | Wt 166.0 lb

## 2021-09-05 DIAGNOSIS — R0683 Snoring: Secondary | ICD-10-CM | POA: Diagnosis not present

## 2021-09-05 DIAGNOSIS — I272 Pulmonary hypertension, unspecified: Secondary | ICD-10-CM

## 2021-09-05 DIAGNOSIS — I480 Paroxysmal atrial fibrillation: Secondary | ICD-10-CM

## 2021-09-05 DIAGNOSIS — I10 Essential (primary) hypertension: Secondary | ICD-10-CM

## 2021-09-05 NOTE — Progress Notes (Signed)
? ?PCP: Steele Sizer, MD ?  ?Primary EP: Dr Rayann Heman ? ?Steven Griffin is a 76 y.o. male who presents today for routine electrophysiology followup.  Since last being seen in our clinic, the patient reports doing very well.  Today, he denies symptoms of palpitations, chest pain, shortness of breath,  lower extremity edema, dizziness, presyncope, or syncope.  The patient is otherwise without complaint today.  ? ?Past Medical History:  ?Diagnosis Date  ? Allergic rhinitis, cause unspecified   ? Atrial fibrillation ([place])   ? CHF (congestive heart failure) ([place])   ? COPD (chronic obstructive pulmonary disease) ([place])   ? pt denies  ? Diverticulosis of colon (without mention of hemorrhage)   ? Essential hypertension, benign   ? pt denies  ? Hyperlipidemia   ? Impotence of organic origin   ? Mild anxiety   ? Personal history of COVID-19   ? Skin cancer   ? Unspecified venous (peripheral) insufficiency   ? ?Past Surgical History:  ?Procedure Laterality Date  ? ATRIAL FIBRILLATION ABLATION N/A 03/03/2017  ? Procedure: Atrial Fibrillation Ablation;  Surgeon: [name], MD;  Location: [place] CV LAB;  Service: Cardiovascular;  Laterality: N/A;  ? COLONOSCOPY WITH PROPOFOL N/A 11/20/2014  ? Procedure: COLONOSCOPY WITH PROPOFOL;  Surgeon: Manya Silvas, MD;  Location[facility] END[facility];  Service: Endoscopy;  Laterality: N/A;  ? rib cartilage    ? benign tumor  ? SKIN CANCER EXCISION Right 06/2010  ? lower leg  ? TONSILLECTOMY AND ADENOIDECTOMY    ? VEIN LIGATION AND STRIPPING    ? ? ?ROS- all systems are reviewed and negatives except as per HPI above ? ?Current Outpatient Medications  ?Medication Sig Dispense Refill  ? 5-Hydroxytryptophan (5-HTP) 100 MG CAPS Take 100 mg by mouth at bedtime.     ? aspirin EC 81 MG tablet Take 81 mg by mouth daily.    ? Cholecalciferol (VITAMIN D3 PO) Take 1 capsule by mouth daily.    ? Coenzyme Q10 (CO Q 10) 100 MG CAPS Take 100 mg by mouth daily.    ? diltiazem (CARDIZEM) 30 MG tablet Take  1 tablet (30 mg total) by mouth every 6 (six) hours as needed (palpitations). 30 tablet 1  ? diphenhydrAMINE (BENADRYL) 25 MG tablet Take 25 mg by mouth at bedtime as needed.    ? finasteride (PROSCAR) 5 MG tablet TAKE 1 TABLET (5 MG TOTAL) BY MOUTH DAILY. 90 tablet 0  ? Flaxseed, Linseed, (FLAXSEED OIL PO) Take 1 each by mouth See admin instructions. Take 15 ml by mouth daily    ? Green Tea, Camellia sinensis, (GREEN TEA EXTRACT PO) Take by mouth.    ? Homeopathic Products (LEG CRAMP RELIEF PO) Take 2 tablets by mouth at bedtime.    ? L-Theanine 100 MG CAPS Take 100 mg by mouth 2 (two) times daily.    ? LITHIUM PO Take 1,000 mcg by mouth. From life extension foundation    ? MAGNESIUM PO Take 300 mg by mouth at bedtime.     ? Melatonin 3 MG CAPS Take 3 mg by mouth at bedtime.     ? Misc Natural Products (BETA-SITOSTEROL PLANT STEROLS) CAPS Take 1 capsule by mouth daily.    ? Misc Natural Products (GLUCOSAMINE CHOND MSM FORMULA PO) Take 2 capsules by mouth 2 (two) times daily.    ? Multiple Vitamin (MULTIVITAMIN) tablet Take 1 tablet by mouth daily.    ? NON FORMULARY Take 1 tablet by mouth daily.  AMPK Supplement    ? Omega-3 Fatty Acids (FISH OIL) 1000 MG CAPS Take 1,000 mg by mouth daily.     ? OVER THE COUNTER MEDICATION Take 1 capsule by mouth daily. Bacopa Supplement    ? OVER THE COUNTER MEDICATION Neuromag    ? OVER THE COUNTER MEDICATION astaxanthim    ? OVER THE COUNTER MEDICATION [place]ard for eyes    ? Polyvinyl Alcohol-Povidone (REFRESH OP) Place 2 drops into both eyes as needed (for dry eyes).    ? Probiotic Product (PROBIOTIC DAILY PO) Take 1 capsule by mouth daily.     ? tamsulosin (FLOMAX) 0.4 MG CAPS capsule Take 1 capsule (0.4 mg total) by mouth daily. 90 capsule 3  ? Turmeric 500 MG CAPS Take by mouth.    ? zinc gluconate 50 MG tablet Take 50 mg by mouth daily.    ? rosuvastatin (CRESTOR) 5 MG tablet Take 1 tablet (5 mg total) by mouth daily. (Patient not taking: Reported on 09/05/2021) 90 tablet 1   ? ?No current facility-administered medications for this visit.  ? ? ?Physical Exam: ?Vitals:  ? 09/05/21 1133  ?BP: 126/84  ?Pulse: (!) 56  ?Weight: 166 lb (75.3 kg)  ?Height: '5\' 10"'$  (1.778 m)  ? ? ?GEN- The patient is well appearing, alert and oriented x 3 today.   ?Head- normocephalic, atraumatic ?Eyes-  Sclera clear, conjunctiva pink ?Ears- hearing intact ?Oropharynx- clear ?Lungs- Clear to ausculation bilaterally, normal work of breathing ?Heart- Regular rate and rhythm, no murmurs, rubs or gallops, PMI not laterally displaced ?GI- soft, NT, ND, + BS ?Extremities- no clubbing, cyanosis, or edema ? ?Wt Readings from Last 3 Encounters:  ?09/05/21 166 lb (75.3 kg)  ?07/22/21 164 lb (74.4 kg)  ?06/11/21 169 lb 1.6 oz (76.7 kg)  ? ?Chest CT 08/13/21-  enlarged pulmonary trunk is noted ? ?EKG tracing ordered today is personally reviewed and shows sinus bradycardia 56 npm, RBBB, LAHB , QTc 426 msec ? ?Assessment and Plan: ? ?Paroxysmal atrial fibrillation ?Well controlled post ablation ?Chads2vasc score is 3.  He declines [place] ?We will reconsider if AF burden increases ? ?2. HTN ?Stable ?No change required today ? ?3. Pulmonary hypertension ?Noted on recent CT by pulmonary 4/23.  Echo today is performed and reveals RV pressure 44 mm Hg.  Asymptomatic ?I will order a sleep study to evaluate for cause.  In addition, he has snoring and does not feel well rested upon waking. ?Follow-up with Dr Fletcher Anon after sleep study is resulted who may consider right heart catheterization for further evaluation. ? ?Risks, benefits and potential toxicities for medications prescribed and/or refilled reviewed with patient today.  ? ?Follow-up with Dr Fletcher Anon as above ?Return to see me in a year ? ?[name] MD, FACC ?09/05/2021 ?12:09 PM ? ? ? ? ?

## 2021-09-05 NOTE — Patient Instructions (Addendum)
Medication Instructions:  ?Your physician recommends that you continue on your current medications as directed. Please refer to the Current Medication list given to you today. ?*If you need a refill on your cardiac medications before your next appointment, please call your pharmacy* ? ?Lab Work: ?None ordered. ?If you have labs (blood work) drawn today and your tests are completely normal, you will receive your results only by: ?MyChart Message (if you have MyChart) OR ?A paper copy in the mail ?If you have any lab test that is abnormal or we need to change your treatment, we will call you to review the results. ? ?Testing/Procedures: ?Your physician has recommended that you have a sleep study. This test records several body functions during sleep, including: brain activity, eye movement, oxygen and carbon dioxide blood levels, heart rate and rhythm, breathing rate and rhythm, the flow of air through your mouth and nose, snoring, body muscle movements, and chest and belly movement.  ? ?Follow-Up: ?At [facility], you and your health needs are our priority.  As part of our continuing mission to provide you with exceptional heart care, we have created designated Provider Care Teams.  These Care Teams include your primary Cardiologist (physician) and Advanced Practice Providers (APPs -  Physician Assistants and Nurse Practitioners) who all work together to provide you with the care you need, when you need it. ? ?Your next appointment:   ? ?You will follow up with Dr. Fletcher Anon after we get the results of your sleep study. ? ?Your physician wants you to follow-up in: one year with Dr. Rayann Heman. ? ?You will receive a reminder letter in the mail two months in advance. If you don't receive a letter, please call our office to schedule the follow-up appointment. ? ? ?Important Information About Sugar ? ? ? ? ? ? ?

## 2021-09-06 LAB — ECHOCARDIOGRAM COMPLETE
AR max vel: 1.99 cm2
AV Area VTI: 1.87 cm2
AV Area mean vel: 1.69 cm2
AV Mean grad: 4 mmHg
AV Peak grad: 8.1 mmHg
Ao pk vel: 1.42 m/s
Area-P 1/2: 1.44 cm2
Calc EF: 51.5 %
S' Lateral: 4.43 cm
Single Plane A2C EF: 46.9 %
Single Plane A4C EF: 56.6 %

## 2021-09-24 ENCOUNTER — Other Ambulatory Visit: Payer: Self-pay | Admitting: Family Medicine

## 2021-09-24 DIAGNOSIS — N138 Other obstructive and reflux uropathy: Secondary | ICD-10-CM

## 2021-09-26 NOTE — Telephone Encounter (Signed)
Outreach made to Pt.  Advised ok to proceed with sleep study.  Pin number given.

## 2021-10-03 DIAGNOSIS — M9905 Segmental and somatic dysfunction of pelvic region: Secondary | ICD-10-CM | POA: Diagnosis not present

## 2021-10-03 DIAGNOSIS — M955 Acquired deformity of pelvis: Secondary | ICD-10-CM | POA: Diagnosis not present

## 2021-10-03 DIAGNOSIS — M9901 Segmental and somatic dysfunction of cervical region: Secondary | ICD-10-CM | POA: Diagnosis not present

## 2021-10-03 DIAGNOSIS — M5442 Lumbago with sciatica, left side: Secondary | ICD-10-CM | POA: Diagnosis not present

## 2021-10-03 DIAGNOSIS — M531 Cervicobrachial syndrome: Secondary | ICD-10-CM | POA: Diagnosis not present

## 2021-10-03 DIAGNOSIS — M9903 Segmental and somatic dysfunction of lumbar region: Secondary | ICD-10-CM | POA: Diagnosis not present

## 2021-10-15 DIAGNOSIS — H6123 Impacted cerumen, bilateral: Secondary | ICD-10-CM | POA: Diagnosis not present

## 2021-10-15 DIAGNOSIS — H903 Sensorineural hearing loss, bilateral: Secondary | ICD-10-CM | POA: Diagnosis not present

## 2021-11-07 DIAGNOSIS — M955 Acquired deformity of pelvis: Secondary | ICD-10-CM | POA: Diagnosis not present

## 2021-11-07 DIAGNOSIS — M9905 Segmental and somatic dysfunction of pelvic region: Secondary | ICD-10-CM | POA: Diagnosis not present

## 2021-11-07 DIAGNOSIS — M531 Cervicobrachial syndrome: Secondary | ICD-10-CM | POA: Diagnosis not present

## 2021-11-07 DIAGNOSIS — M9903 Segmental and somatic dysfunction of lumbar region: Secondary | ICD-10-CM | POA: Diagnosis not present

## 2021-11-07 DIAGNOSIS — M5442 Lumbago with sciatica, left side: Secondary | ICD-10-CM | POA: Diagnosis not present

## 2021-11-07 DIAGNOSIS — M9901 Segmental and somatic dysfunction of cervical region: Secondary | ICD-10-CM | POA: Diagnosis not present

## 2021-12-12 DIAGNOSIS — M955 Acquired deformity of pelvis: Secondary | ICD-10-CM | POA: Diagnosis not present

## 2021-12-12 DIAGNOSIS — M9903 Segmental and somatic dysfunction of lumbar region: Secondary | ICD-10-CM | POA: Diagnosis not present

## 2021-12-12 DIAGNOSIS — M5442 Lumbago with sciatica, left side: Secondary | ICD-10-CM | POA: Diagnosis not present

## 2021-12-12 DIAGNOSIS — M9905 Segmental and somatic dysfunction of pelvic region: Secondary | ICD-10-CM | POA: Diagnosis not present

## 2021-12-12 DIAGNOSIS — M531 Cervicobrachial syndrome: Secondary | ICD-10-CM | POA: Diagnosis not present

## 2021-12-12 DIAGNOSIS — M9901 Segmental and somatic dysfunction of cervical region: Secondary | ICD-10-CM | POA: Diagnosis not present

## 2021-12-27 ENCOUNTER — Other Ambulatory Visit: Payer: Self-pay | Admitting: Family Medicine

## 2021-12-27 DIAGNOSIS — N138 Other obstructive and reflux uropathy: Secondary | ICD-10-CM

## 2022-01-08 DIAGNOSIS — M9901 Segmental and somatic dysfunction of cervical region: Secondary | ICD-10-CM | POA: Diagnosis not present

## 2022-01-08 DIAGNOSIS — M955 Acquired deformity of pelvis: Secondary | ICD-10-CM | POA: Diagnosis not present

## 2022-01-08 DIAGNOSIS — M9905 Segmental and somatic dysfunction of pelvic region: Secondary | ICD-10-CM | POA: Diagnosis not present

## 2022-01-08 DIAGNOSIS — M5442 Lumbago with sciatica, left side: Secondary | ICD-10-CM | POA: Diagnosis not present

## 2022-01-08 DIAGNOSIS — M531 Cervicobrachial syndrome: Secondary | ICD-10-CM | POA: Diagnosis not present

## 2022-01-08 DIAGNOSIS — M9903 Segmental and somatic dysfunction of lumbar region: Secondary | ICD-10-CM | POA: Diagnosis not present

## 2022-02-12 DIAGNOSIS — M9901 Segmental and somatic dysfunction of cervical region: Secondary | ICD-10-CM | POA: Diagnosis not present

## 2022-02-12 DIAGNOSIS — M955 Acquired deformity of pelvis: Secondary | ICD-10-CM | POA: Diagnosis not present

## 2022-02-12 DIAGNOSIS — M9903 Segmental and somatic dysfunction of lumbar region: Secondary | ICD-10-CM | POA: Diagnosis not present

## 2022-02-12 DIAGNOSIS — M531 Cervicobrachial syndrome: Secondary | ICD-10-CM | POA: Diagnosis not present

## 2022-02-12 DIAGNOSIS — M5442 Lumbago with sciatica, left side: Secondary | ICD-10-CM | POA: Diagnosis not present

## 2022-02-12 DIAGNOSIS — M9905 Segmental and somatic dysfunction of pelvic region: Secondary | ICD-10-CM | POA: Diagnosis not present

## 2022-02-19 DIAGNOSIS — M531 Cervicobrachial syndrome: Secondary | ICD-10-CM | POA: Diagnosis not present

## 2022-02-19 DIAGNOSIS — M955 Acquired deformity of pelvis: Secondary | ICD-10-CM | POA: Diagnosis not present

## 2022-02-19 DIAGNOSIS — M9903 Segmental and somatic dysfunction of lumbar region: Secondary | ICD-10-CM | POA: Diagnosis not present

## 2022-02-19 DIAGNOSIS — M5442 Lumbago with sciatica, left side: Secondary | ICD-10-CM | POA: Diagnosis not present

## 2022-02-19 DIAGNOSIS — M9901 Segmental and somatic dysfunction of cervical region: Secondary | ICD-10-CM | POA: Diagnosis not present

## 2022-02-19 DIAGNOSIS — M9905 Segmental and somatic dysfunction of pelvic region: Secondary | ICD-10-CM | POA: Diagnosis not present

## 2022-03-12 DIAGNOSIS — M9903 Segmental and somatic dysfunction of lumbar region: Secondary | ICD-10-CM | POA: Diagnosis not present

## 2022-03-12 DIAGNOSIS — M9905 Segmental and somatic dysfunction of pelvic region: Secondary | ICD-10-CM | POA: Diagnosis not present

## 2022-03-12 DIAGNOSIS — M531 Cervicobrachial syndrome: Secondary | ICD-10-CM | POA: Diagnosis not present

## 2022-03-12 DIAGNOSIS — M9901 Segmental and somatic dysfunction of cervical region: Secondary | ICD-10-CM | POA: Diagnosis not present

## 2022-03-12 DIAGNOSIS — M955 Acquired deformity of pelvis: Secondary | ICD-10-CM | POA: Diagnosis not present

## 2022-03-12 DIAGNOSIS — M5442 Lumbago with sciatica, left side: Secondary | ICD-10-CM | POA: Diagnosis not present

## 2022-03-17 DIAGNOSIS — Z7982 Long term (current) use of aspirin: Secondary | ICD-10-CM | POA: Diagnosis not present

## 2022-03-17 DIAGNOSIS — N400 Enlarged prostate without lower urinary tract symptoms: Secondary | ICD-10-CM | POA: Diagnosis not present

## 2022-03-17 DIAGNOSIS — M1990 Unspecified osteoarthritis, unspecified site: Secondary | ICD-10-CM | POA: Diagnosis not present

## 2022-03-17 DIAGNOSIS — F17200 Nicotine dependence, unspecified, uncomplicated: Secondary | ICD-10-CM | POA: Diagnosis not present

## 2022-03-17 DIAGNOSIS — R69 Illness, unspecified: Secondary | ICD-10-CM | POA: Diagnosis not present

## 2022-03-17 DIAGNOSIS — Z806 Family history of leukemia: Secondary | ICD-10-CM | POA: Diagnosis not present

## 2022-03-17 DIAGNOSIS — N529 Male erectile dysfunction, unspecified: Secondary | ICD-10-CM | POA: Diagnosis not present

## 2022-04-07 ENCOUNTER — Encounter: Payer: Self-pay | Admitting: Family Medicine

## 2022-04-07 ENCOUNTER — Ambulatory Visit
Admission: RE | Admit: 2022-04-07 | Discharge: 2022-04-07 | Disposition: A | Discharge status: Home / Self Care | Payer: Medicare HMO | Attending: Family Medicine | Admitting: Family Medicine

## 2022-04-07 ENCOUNTER — Ambulatory Visit (INDEPENDENT_AMBULATORY_CARE_PROVIDER_SITE_OTHER): Payer: Medicare HMO | Admitting: Family Medicine

## 2022-04-07 ENCOUNTER — Ambulatory Visit
Admission: RE | Admit: 2022-04-07 | Discharge: 2022-04-07 | Disposition: A | Discharge status: Home / Self Care | Payer: Medicare HMO | Source: Ambulatory Visit | Attending: Family Medicine | Admitting: Family Medicine

## 2022-04-07 VITALS — BP 120/72 | HR 60 | Temp 97.7°F | Resp 18 | Ht 70.0 in | Wt 164.3 lb

## 2022-04-07 DIAGNOSIS — D692 Other nonthrombocytopenic purpura: Secondary | ICD-10-CM | POA: Diagnosis not present

## 2022-04-07 DIAGNOSIS — I2510 Atherosclerotic heart disease of native coronary artery without angina pectoris: Secondary | ICD-10-CM

## 2022-04-07 DIAGNOSIS — N138 Other obstructive and reflux uropathy: Secondary | ICD-10-CM

## 2022-04-07 DIAGNOSIS — M546 Pain in thoracic spine: Secondary | ICD-10-CM | POA: Diagnosis not present

## 2022-04-07 DIAGNOSIS — N401 Enlarged prostate with lower urinary tract symptoms: Secondary | ICD-10-CM

## 2022-04-07 DIAGNOSIS — M5137 Other intervertebral disc degeneration, lumbosacral region: Secondary | ICD-10-CM

## 2022-04-07 DIAGNOSIS — I480 Paroxysmal atrial fibrillation: Secondary | ICD-10-CM

## 2022-04-07 DIAGNOSIS — J432 Centrilobular emphysema: Secondary | ICD-10-CM

## 2022-04-07 DIAGNOSIS — I700 Atherosclerosis of aorta: Secondary | ICD-10-CM

## 2022-04-07 MED ORDER — TAMSULOSIN HCL 0.4 MG PO CAPS: 0.4000 mg | ORAL_CAPSULE | Freq: Every day | ORAL | 3 refills | Status: DC

## 2022-04-07 MED ORDER — FINASTERIDE 5 MG PO TABS: 5.0000 mg | ORAL_TABLET | Freq: Every day | ORAL | 3 refills | Status: DC

## 2022-04-07 NOTE — Progress Notes (Signed)
Name: Steven Griffin   MRN: [id number]    DOB: 01-20-1946   Date:04/07/2022       Progress Note  Subjective  Chief Complaint  Follow Up/ MRI Referral  HPI  Hyperlipidemia: he does not want to take statin therapy , taking red yeast rice, he had labs done recently, avoids fried food, low sugar diet and does not want to take statin therapy    BPH: he used to see Dr. Rogers Blocker however since 2019 he switched provider and is now going to [facility] in [place], he states recently had PSA done and it was 1.6 . He is on Finasteride and Tamsulosin.    Afib with  AV node ablation: Nov 2018, doing well , denies chest pain, palpitation,  or decrease in exercise tolerance off medication since ablation,  he is not on anticoagulation . He takes aspirin    Chronic venous insufficiency: seeing Dr. Lucky Cowboy, last visit 03/2018, he has a history of phlebitis but not recurrence since first episode in 2019. He denies pain or edema, doing well at this time    Emphysema : he had COVID-17 Mar 2019 but no problems since. He  states he is back to his baseline, no cough, wheezing or SOB.  He still smokes - he says he does not inhale. He does not want any inhalers . Unchanged  Refuses flu, RSV, at this time    Aorta Atherosclerosis: : he continues to refuse statin therapy, he is on a healthy diet  , he is on aspirin daily  He also has atherosclerosis of Left anterior descending coronary. He denies decrease in exercise tolerance. He had labs done recently ordered by life extension and will send me the results   Senile Purpura: stable , he still has easy bruising, present on right hand at this time  Mid back pain : started about 2 months ago , he states initially while playing golf and noticed some right lower back pain, however he started working in his yard and lifting heavy logs and pain moved to mid back , sharp, intermittent and radiates paraspinal muscles . He has tried Restaurant manager, fast food and tried manipulation, k-laser,  acupuncture but pain is still present. He states currently is zero /10. He states some positions it causes pain and sometimes when coughing. Pain is worse with left lateral bending, extension and rotation to the right side . He has chronic low back pain and that has been stable.     Patient Active Problem List   Diagnosis Date Noted   Snoring 09/05/2021   Pulmonary hypertension, unspecified ([place]) 09/05/2021   DDD (degenerative disc disease), lumbosacral 02/26/2021   Benign prostatic hyperplasia with urinary obstruction 02/26/2021   Centrilobular emphysema ([place]) 02/26/2021   Coronary artery disease involving native coronary artery of native heart without angina pectoris 12/13/2019   Atherosclerosis of aorta ([place]) 12/13/2019   Senile purpura ([place]) 12/13/2019   Personal history of tobacco use, presenting hazards to health 11/25/2018   Essential hypertension, benign 02/23/2018   Superficial thrombophlebitis 02/23/2018   Phlebitis of left lower extremity 01/26/2018   Paroxysmal atrial fibrillation ([place]) 03/03/2017   Bradycardia 08/31/2015   Muscle tear 04/20/2015   Allergic rhinitis, seasonal 04/20/2015   Chronic venous insufficiency 10/27/2014   Intermittent tremor 10/27/2014   ED (erectile dysfunction) of non-organic origin 10/27/2014   Colon, diverticulosis 10/27/2014   Benign prostatic hypertrophy without urinary obstruction 10/27/2014   Bundle branch block, right and left anterior fascicular 06/07/2013   Atrial fibrillation ([place])  10/20/2012   Obstructive apnea 03/23/2012   Polypharmacy 04/15/2011   On dofetilide therapy 04/15/2011   Hypercholesteremia 01/18/2011   H/O squamous cell carcinoma of skin 01/18/2011   History of major vascular surgery 01/18/2011    Past Surgical History:  Procedure Laterality Date   ATRIAL FIBRILLATION ABLATION N/A 03/03/2017   Procedure: Atrial Fibrillation Ablation;  Surgeon: [name], MD;  Location: [facility];  Service:  Cardiovascular;  Laterality: N/A;   COLONOSCOPY WITH PROPOFOL N/A 11/20/2014   Procedure: COLONOSCOPY WITH PROPOFOL;  Surgeon: Manya Silvas, MD;  Location: [facility];  Service: Endoscopy;  Laterality: N/A;   rib cartilage     benign tumor   SKIN CANCER EXCISION Right 06/2010   lower leg   TONSILLECTOMY AND ADENOIDECTOMY     VEIN LIGATION AND STRIPPING      Family History  Problem Relation Age of Onset   Suicidality Mother    Leukemia Father    Hypercholesterolemia Father    Hypertension Brother    Hyperlipidemia Brother    Cirrhosis Brother    Colon cancer Maternal Grandmother     Social History   Tobacco Use   Smoking status: Every Day    Packs/day: 1.00    Years: 35.00    Total pack years: 35.00    Types: Cigarettes, Cigars   Smokeless tobacco: Never   Tobacco comments:    patient uses natural tobacco (rolls his own)  Substance Use Topics   Alcohol use: Yes    Alcohol/week: 1.0 standard drink of alcohol    Types: 1 Glasses of wine per week    Comment: 2-3 glasses of wine at night     Current Outpatient Medications:    5-Hydroxytryptophan (5-HTP) 100 MG CAPS, Take 100 mg by mouth at bedtime. , Disp: , Rfl:    aspirin EC 81 MG tablet, Take 81 mg by mouth daily., Disp: , Rfl:    Cholecalciferol (VITAMIN D3 PO), Take 1 capsule by mouth daily., Disp: , Rfl:    Coenzyme Q10 (CO Q 10) 100 MG CAPS, Take 100 mg by mouth daily., Disp: , Rfl:    diltiazem (CARDIZEM) 30 MG tablet, Take 1 tablet (30 mg total) by mouth every 6 (six) hours as needed (palpitations)., Disp: 30 tablet, Rfl: 1   diphenhydrAMINE (BENADRYL) 25 MG tablet, Take 25 mg by mouth at bedtime as needed., Disp: , Rfl:    finasteride (PROSCAR) 5 MG tablet, TAKE 1 TABLET (5 MG TOTAL) BY MOUTH DAILY., Disp: 90 tablet, Rfl: 1   Flaxseed, Linseed, (FLAXSEED OIL PO), Take 1 each by mouth See admin instructions. Take 15 ml by mouth daily, Disp: , Rfl:    Green Tea, Camellia sinensis, (GREEN TEA EXTRACT PO),  Take by mouth., Disp: , Rfl:    Homeopathic Products (LEG CRAMP RELIEF PO), Take 2 tablets by mouth at bedtime., Disp: , Rfl:    L-Theanine 100 MG CAPS, Take 100 mg by mouth 2 (two) times daily., Disp: , Rfl:    LITHIUM PO, Take 1,000 mcg by mouth. From life extension foundation, Disp: , Rfl:    MAGNESIUM PO, Take 300 mg by mouth at bedtime. , Disp: , Rfl:    Melatonin 3 MG CAPS, Take 3 mg by mouth at bedtime. , Disp: , Rfl:    Misc Natural Products (BETA-SITOSTEROL PLANT STEROLS) CAPS, Take 1 capsule by mouth daily., Disp: , Rfl:    Misc Natural Products (GLUCOSAMINE CHOND MSM FORMULA PO), Take 2 capsules by mouth  2 (two) times daily., Disp: , Rfl:    Multiple Vitamin (MULTIVITAMIN) tablet, Take 1 tablet by mouth daily., Disp: , Rfl:    NON FORMULARY, Take 1 tablet by mouth daily. AMPK Supplement, Disp: , Rfl:    Omega-3 Fatty Acids (FISH OIL) 1000 MG CAPS, Take 1,000 mg by mouth daily. , Disp: , Rfl:    OVER THE COUNTER MEDICATION, Take 1 capsule by mouth daily. Bacopa Supplement, Disp: , Rfl:    OVER THE COUNTER MEDICATION, Neuromag, Disp: , Rfl:    OVER THE COUNTER MEDICATION, astaxanthim, Disp: , Rfl:    OVER THE COUNTER MEDICATION, Macuguard for eyes, Disp: , Rfl:    Polyvinyl Alcohol-Povidone (REFRESH OP), Place 2 drops into both eyes as needed (for dry eyes)., Disp: , Rfl:    Probiotic Product (PROBIOTIC DAILY PO), Take 1 capsule by mouth daily. , Disp: , Rfl:    tamsulosin (FLOMAX) 0.4 MG CAPS capsule, Take 1 capsule (0.4 mg total) by mouth daily., Disp: 90 capsule, Rfl: 3   Turmeric 500 MG CAPS, Take by mouth., Disp: , Rfl:    zinc gluconate 50 MG tablet, Take 50 mg by mouth daily., Disp: , Rfl:   No Known Allergies  I personally reviewed active problem list, medication list, allergies, family history, social history, health maintenance with the patient/caregiver today.   ROS  Constitutional: Negative for fever or weight change.  Respiratory: Negative for cough and shortness of  breath.   Cardiovascular: Negative for chest pain or palpitations.  Gastrointestinal: Negative for abdominal pain, no bowel changes.  Musculoskeletal: Negative for gait problem or joint swelling.  Skin: Negative for rash.  Neurological: Negative for dizziness or headache.  No other specific complaints in a complete review of systems (except as listed in HPI above).   Objective  Vitals:   04/07/22 1434  BP: 120/72  Pulse: 60  Resp: 18  Temp: 97.7 F (36.5 C)  TempSrc: Oral  SpO2: 95%  Weight: 164 lb 4.8 oz (74.5 kg)  Height: '5\' 10"'$  (1.778 m)    Body mass index is 23.57 kg/m.  Physical Exam  Constitutional: Patient appears well-developed and well-nourished. No distress.  HENT: Head: Normocephalic and atraumatic. Ears: B TMs ok, no erythema or effusion; Nose: Nose normal. Mouth/Throat: Oropharynx is clear and moist. No oropharyngeal exudate.  Eyes: Conjunctivae and EOM are normal. Pupils are equal, round, and reactive to light. No scleral icterus.  Neck: Normal range of motion. Neck supple. No JVD present. No thyromegaly present.  Cardiovascular: Normal rate, regular rhythm and normal heart sounds.  No murmur heard. No BLE edema. Pulmonary/Chest: Effort normal and breath sounds normal. No respiratory distress. Abdominal: Soft. Bowel sounds are normal, no distension. There is no tenderness. no masses Musculoskeletal: no pain during palpation of spine, pain with left lateral bending, right rotation and extension of spine,  Neurological: he is alert and oriented to person, place, and time. No cranial nerve deficit. Coordination, balance, strength, speech and gait are normal.  Skin: Skin is warm and dry. No rash noted. No erythema.  Psychiatric: Patient has a normal mood and affect. behavior is normal. Judgment and thought content normal.    PHQ2/9:    04/07/2022    2:36 PM 07/22/2021   10:29 AM 06/11/2021   10:57 AM 03/13/2021    1:11 PM 02/26/2021   11:03 AM  Depression  screen PHQ 2/9  Decreased Interest 0 0 0 0 0  Down, Depressed, Hopeless 0 0 0 0 0  PHQ -  2 Score 0 0 0 0 0  Altered sleeping 0 0  0   Tired, decreased energy 0 0  0   Change in appetite 0 0  0   Feeling bad or failure about yourself  0 0  0   Trouble concentrating 0 2  0   Moving slowly or fidgety/restless 0 0  0   Suicidal thoughts 0 0  0   PHQ-9 Score 0 2  0     phq 9 is negative   Fall Risk:    04/07/2022    2:36 PM 07/22/2021   10:28 AM 06/11/2021   11:06 AM 03/13/2021    1:11 PM 02/26/2021   11:02 AM  [place] in the past year? 0 0 0 0 0  Number falls in past yr:  0 0 0   Injury with Fall?  0 0 0   Risk for fall due to : No Fall Risks No Fall Risks No Fall Risks No Fall Risks   Follow up Falls prevention discussed;Education provided;Falls evaluation completed Falls prevention discussed Falls prevention discussed Falls prevention discussed Falls prevention discussed      Functional Status Survey: Is the patient deaf or have difficulty hearing?: No Does the patient have difficulty seeing, even when wearing glasses/contacts?: No Does the patient have difficulty concentrating, remembering, or making decisions?: No Does the patient have difficulty walking or climbing stairs?: No Does the patient have difficulty dressing or bathing?: No Does the patient have difficulty doing errands alone such as visiting a doctor's office or shopping?: No    Assessment & Plan  1. Atherosclerosis of aorta (HCC)  Refuses statin therapy   2. Paroxysmal atrial fibrillation (HCC)  Rate controled, on aspirin  3. Centrilobular emphysema (HCC)  Refuses inhalers - states feeling find  4. Coronary artery disease involving native coronary artery of native heart without angina pectoris  Asymptomatic   5. Senile purpura ([place])  Reassurance given  6. DDD (degenerative disc disease), lumbosacral  Chronic and stable  7. Benign prostatic hyperplasia with urinary  obstruction  - tamsulosin (FLOMAX) 0.4 MG CAPS capsule; Take 1 capsule (0.4 mg total) by mouth daily.  Dispense: 90 capsule; Refill: 3 - finasteride (PROSCAR) 5 MG tablet; Take 1 tablet (5 mg total) by mouth daily.  Dispense: 90 tablet; Refill: 3  8. Right-sided thoracic back pain, unspecified chronicity  - DG Thoracic Spine 2 View; Future

## 2022-04-08 ENCOUNTER — Encounter: Payer: Self-pay | Admitting: Family Medicine

## 2022-04-14 ENCOUNTER — Telehealth: Payer: Self-pay | Admitting: Family Medicine

## 2022-04-14 NOTE — Telephone Encounter (Unsigned)
Copied from [place] [id number]. Topic: Referral - Request for Referral >> Apr 14, 2022 11:55 AM Oley Balm A wrote: Has patient seen PCP for this complaint? Yes.    Preferred provider/office: [facility] [place], [place], [place] [address]  Reason for referral: Patient is requesting a referral for his back

## 2022-04-15 ENCOUNTER — Telehealth: Payer: Self-pay | Admitting: Family Medicine

## 2022-04-15 ENCOUNTER — Other Ambulatory Visit: Payer: Self-pay

## 2022-04-15 DIAGNOSIS — M5137 Other intervertebral disc degeneration, lumbosacral region: Secondary | ICD-10-CM

## 2022-04-15 DIAGNOSIS — M546 Pain in thoracic spine: Secondary | ICD-10-CM

## 2022-04-15 NOTE — Telephone Encounter (Unsigned)
Copied from [place] [id number]. Topic: Referral - Request for Referral >> Apr 15, 2022 11:39 AM Chapman Fitch wrote: Has patient seen PCP for this complaint? Yes  *If NO, is insurance requiring patient see PCP for this issue before PCP can refer them? Medicare requires referral  Referral for which specialty: PT Preferred provider/office: [facility] phone: [phone]/ fax# [phone] [address] [place], [place], [place] [address]  Reason for referral: mid back pain / per visit with Dr. Ancil Boozer last week / Pt has an appt with [facility] tomorrow 12.20.23 '@3pm'$  / please advise

## 2022-04-15 NOTE — Telephone Encounter (Signed)
Referral placed [er Dr. Ancil Boozer

## 2022-04-16 DIAGNOSIS — R293 Abnormal posture: Secondary | ICD-10-CM | POA: Diagnosis not present

## 2022-04-16 DIAGNOSIS — M546 Pain in thoracic spine: Secondary | ICD-10-CM | POA: Diagnosis not present

## 2022-04-24 NOTE — Progress Notes (Signed)
Name: Steven Griffin   MRN: [id number]    DOB: [birth year]   Date:04/25/2022       Progress Note  Subjective  Chief Complaint  Chief Complaint  Patient presents with   URI    Cough, congestion, no fever,fatigue for 5 days    I connected with  [name]  on 04/25/22 at  8:00 AM EST by a video enabled telemedicine application and verified that I am speaking with the correct person using two identifiers.  I discussed the limitations of evaluation and management by telemedicine and the availability of in person appointments. The patient expressed understanding and agreed to proceed with a virtual visit  Staff also discussed with the patient that there may be a patient responsible charge related to this service. Patient Location: home Provider Location: [facility] Additional Individuals present: alone  HPI  URI:  symptom started five days ago. He reports symptoms of cough, congestion and fatigue. He denies any fever or shortness of breath.   Recommend pushing fluids, getting rest.  Start vitamin c, d and zinc.  Can also take zyrtec, flonase, mucinex.  Will send in steroid taper, and tussinex.  Emphysema:does not currently have any inhalers and has declined them in the past.  Patient reports he has never had trouble with his breathing.    Patient Active Problem List   Diagnosis Date Noted   Snoring 09/05/2021   Pulmonary hypertension, unspecified ([place]) 09/05/2021   DDD (degenerative disc disease), lumbosacral 02/26/2021   Benign prostatic hyperplasia with urinary obstruction 02/26/2021   Centrilobular emphysema ([place]) 02/26/2021   Coronary artery disease involving native coronary artery of native heart without angina pectoris 12/13/2019   Atherosclerosis of aorta ([place]) 12/13/2019   Senile purpura ([place]) 12/13/2019   Personal history of tobacco use, presenting hazards to health 11/25/2018   Essential hypertension, benign 02/23/2018   Superficial thrombophlebitis 02/23/2018   Phlebitis of  left lower extremity 01/26/2018   Paroxysmal atrial fibrillation ([place]) 03/03/2017   Bradycardia 08/31/2015   Muscle tear 04/20/2015   Allergic rhinitis, seasonal 04/20/2015   Chronic venous insufficiency 10/27/2014   Intermittent tremor 10/27/2014   ED (erectile dysfunction) of non-organic origin 10/27/2014   Colon, diverticulosis 10/27/2014   Benign prostatic hypertrophy without urinary obstruction 10/27/2014   Bundle branch block, right and left anterior fascicular 06/07/2013   Atrial fibrillation ([place]) 10/20/2012   Obstructive apnea 03/23/2012   Polypharmacy 04/15/2011   On dofetilide therapy 04/15/2011   Hypercholesteremia 01/18/2011   H/O squamous cell carcinoma of skin 01/18/2011   History of major vascular surgery 01/18/2011    Social History   Tobacco Use   Smoking status: Every Day    Packs/day: 1.00    Years: 35.00    Total pack years: 35.00    Types: Cigarettes, Cigars   Smokeless tobacco: Never   Tobacco comments:    patient uses natural tobacco (rolls his own)  Substance Use Topics   Alcohol use: Yes    Alcohol/week: 1.0 standard drink of alcohol    Types: 1 Glasses of wine per week    Comment: 2-3 glasses of wine at night     Current Outpatient Medications:    5-Hydroxytryptophan (5-HTP) 100 MG CAPS, Take 100 mg by mouth at bedtime. , Disp: , Rfl:    aspirin EC 81 MG tablet, Take 81 mg by mouth daily., Disp: , Rfl:    chlorpheniramine-HYDROcodone (TUSSIONEX) 10-8 MG/5ML, Take 5 mLs by mouth every 12 (twelve) hours as needed for cough.,  Disp: 115 mL, Rfl: 0   Cholecalciferol (VITAMIN D3 PO), Take 1 capsule by mouth daily., Disp: , Rfl:    Coenzyme Q10 (CO Q 10) 100 MG CAPS, Take 100 mg by mouth daily., Disp: , Rfl:    diltiazem (CARDIZEM) 30 MG tablet, Take 1 tablet (30 mg total) by mouth every 6 (six) hours as needed (palpitations)., Disp: 30 tablet, Rfl: 1   diphenhydrAMINE (BENADRYL) 25 MG tablet, Take 25 mg by mouth at bedtime as needed., Disp: , Rfl:     finasteride (PROSCAR) 5 MG tablet, Take 1 tablet (5 mg total) by mouth daily., Disp: 90 tablet, Rfl: 3   Flaxseed, Linseed, (FLAXSEED OIL PO), Take 1 each by mouth See admin instructions. Take 15 ml by mouth daily, Disp: , Rfl:    Green Tea, Camellia sinensis, (GREEN TEA EXTRACT PO), Take by mouth., Disp: , Rfl:    Homeopathic Products (LEG CRAMP RELIEF PO), Take 2 tablets by mouth at bedtime., Disp: , Rfl:    L-Theanine 100 MG CAPS, Take 100 mg by mouth 2 (two) times daily., Disp: , Rfl:    LITHIUM PO, Take 1,000 mcg by mouth. From life extension foundation, Disp: , Rfl:    MAGNESIUM PO, Take 300 mg by mouth at bedtime. , Disp: , Rfl:    Melatonin 3 MG CAPS, Take 3 mg by mouth at bedtime. , Disp: , Rfl:    Misc Natural Products (BETA-SITOSTEROL PLANT STEROLS) CAPS, Take 1 capsule by mouth daily., Disp: , Rfl:    Misc Natural Products (GLUCOSAMINE CHOND MSM FORMULA PO), Take 2 capsules by mouth 2 (two) times daily., Disp: , Rfl:    Multiple Vitamin (MULTIVITAMIN) tablet, Take 1 tablet by mouth daily., Disp: , Rfl:    NON FORMULARY, Take 1 tablet by mouth daily. AMPK Supplement, Disp: , Rfl:    Omega-3 Fatty Acids (FISH OIL) 1000 MG CAPS, Take 1,000 mg by mouth daily. , Disp: , Rfl:    OVER THE COUNTER MEDICATION, Take 1 capsule by mouth daily. Bacopa Supplement, Disp: , Rfl:    OVER THE COUNTER MEDICATION, Neuromag, Disp: , Rfl:    OVER THE COUNTER MEDICATION, astaxanthim, Disp: , Rfl:    OVER THE COUNTER MEDICATION, Macuguard for eyes, Disp: , Rfl:    Polyvinyl Alcohol-Povidone (REFRESH OP), Place 2 drops into both eyes as needed (for dry eyes)., Disp: , Rfl:    predniSONE (STERAPRED UNI-PAK 21 TAB) 10 MG (21) TBPK tablet, Take as directed on package.  (60 mg po on day 1, 50 mg po on day 2...), Disp: 21 tablet, Rfl: 0   Probiotic Product (PROBIOTIC DAILY PO), Take 1 capsule by mouth daily. , Disp: , Rfl:    tamsulosin (FLOMAX) 0.4 MG CAPS capsule, Take 1 capsule (0.4 mg total) by mouth daily.,  Disp: 90 capsule, Rfl: 3   Turmeric 500 MG CAPS, Take by mouth., Disp: , Rfl:    zinc gluconate 50 MG tablet, Take 50 mg by mouth daily., Disp: , Rfl:   No Known Allergies  I personally reviewed active problem list, medication list, allergies, notes from last encounter with the patient/caregiver today.  ROS  Constitutional: Negative for fever or weight change. Positive for fatigue HEENT: positive for nasal congestion and facial pressure Respiratory: positive for cough and negative for shortness of breath.   Cardiovascular: Negative for chest pain or palpitations.  Gastrointestinal: Negative for abdominal pain, no bowel changes.  Musculoskeletal: Negative for gait problem or joint swelling.  Skin: Negative for  rash.  Neurological: Negative for dizziness or headache.  No other specific complaints in a complete review of systems (except as listed in HPI above).   Objective  Virtual encounter, vitals not obtained.  There is no height or weight on file to calculate BMI.  Nursing Note and Vital Signs reviewed.  Physical Exam  Awake, alert and oriented, speaking in complete sentences  No results found for this or any previous visit (from the past 72 hour(s)).  Assessment & Plan  1. Viral upper respiratory tract infection Push fluids and get rest -start steroid taper and tussinex Can also take vitamin c, d and zinc Can also take flonase, zyrtec and mucinex - predniSONE (STERAPRED UNI-PAK 21 TAB) 10 MG (21) TBPK tablet; Take as directed on package.  (60 mg po on day 1, 50 mg po on day 2...)  Dispense: 21 tablet; Refill: 0 - chlorpheniramine-HYDROcodone (TUSSIONEX) 10-8 MG/5ML; Take 5 mLs by mouth every 12 (twelve) hours as needed for cough.  Dispense: 115 mL; Refill: 0  2. Centrilobular emphysema (HCC) - stable, will give steroid taper   -Red flags and when to present for emergency care or RTC including fever >101.33F, chest pain, shortness of breath, new/worsening/un-resolving  symptoms,  reviewed with patient at time of visit. Follow up and care instructions discussed and provided in AVS. - I discussed the assessment and treatment plan with the patient. The patient was provided an opportunity to ask questions and all were answered. The patient agreed with the plan and demonstrated an understanding of the instructions.  I provided 20 minutes of non-face-to-face time during this encounter.  Bo Merino, FNP

## 2022-04-25 ENCOUNTER — Telehealth (INDEPENDENT_AMBULATORY_CARE_PROVIDER_SITE_OTHER): Payer: Medicare HMO | Admitting: Nurse Practitioner

## 2022-04-25 ENCOUNTER — Other Ambulatory Visit: Payer: Self-pay

## 2022-04-25 ENCOUNTER — Encounter: Payer: Self-pay | Admitting: Nurse Practitioner

## 2022-04-25 DIAGNOSIS — J069 Acute upper respiratory infection, unspecified: Secondary | ICD-10-CM | POA: Diagnosis not present

## 2022-04-25 DIAGNOSIS — J432 Centrilobular emphysema: Secondary | ICD-10-CM

## 2022-04-25 MED ORDER — HYDROCOD POLI-CHLORPHE POLI ER 10-8 MG/5ML PO SUER: 5.0000 mL | Freq: Two times a day (BID) | ORAL | 0 refills | Status: DC | PRN

## 2022-04-25 MED ORDER — PREDNISONE 10 MG (21) PO TBPK: ORAL_TABLET | ORAL | 0 refills | Status: DC

## 2022-05-01 DIAGNOSIS — R293 Abnormal posture: Secondary | ICD-10-CM | POA: Diagnosis not present

## 2022-05-01 DIAGNOSIS — M546 Pain in thoracic spine: Secondary | ICD-10-CM | POA: Diagnosis not present

## 2022-05-27 DIAGNOSIS — M9903 Segmental and somatic dysfunction of lumbar region: Secondary | ICD-10-CM | POA: Diagnosis not present

## 2022-05-27 DIAGNOSIS — M531 Cervicobrachial syndrome: Secondary | ICD-10-CM | POA: Diagnosis not present

## 2022-05-27 DIAGNOSIS — M9902 Segmental and somatic dysfunction of thoracic region: Secondary | ICD-10-CM | POA: Diagnosis not present

## 2022-05-27 DIAGNOSIS — M9901 Segmental and somatic dysfunction of cervical region: Secondary | ICD-10-CM | POA: Diagnosis not present

## 2022-05-27 DIAGNOSIS — M546 Pain in thoracic spine: Secondary | ICD-10-CM | POA: Diagnosis not present

## 2022-05-27 DIAGNOSIS — M5137 Other intervertebral disc degeneration, lumbosacral region: Secondary | ICD-10-CM | POA: Diagnosis not present

## 2022-06-12 ENCOUNTER — Ambulatory Visit (INDEPENDENT_AMBULATORY_CARE_PROVIDER_SITE_OTHER): Payer: Medicare HMO

## 2022-06-12 VITALS — Ht 70.0 in | Wt 164.0 lb

## 2022-06-12 DIAGNOSIS — Z0000 Encounter for general adult medical examination without abnormal findings: Secondary | ICD-10-CM | POA: Diagnosis not present

## 2022-06-12 NOTE — Progress Notes (Signed)
I connected with  [name] on 06/12/22 by a audio enabled telemedicine application and verified that I am speaking with the correct person using two identifiers.  Patient Location: Home  Provider Location: Office/Clinic  I discussed the limitations of evaluation and management by telemedicine. The patient expressed understanding and agreed to proceed.  Subjective:   Steven Griffin is a 77 y.o. male who presents for Medicare Annual/Subsequent preventive examination.  Review of Systems    Cardiac Risk Factors include: advanced age (>63mn, >>44women);dyslipidemia;hypertension;male gender    Objective:    Today's Vitals   06/12/22 1021  Weight: 164 lb (74.4 kg)  Height: 5' 10"$  (1.778 m)   Body mass index is 23.53 kg/m.     06/12/2022   10:38 AM 06/11/2021   10:58 AM 06/07/2020   10:43 AM 05/20/2019    8:53 AM 03/21/2019    3:35 PM 05/14/2018    9:09 AM 03/03/2017    6:18 AM  Advanced Directives  Does Patient Have a Medical Advance Directive? Yes Yes Yes Yes Yes Yes No  Type of AParamedicof [place] will [place]Living will [place] NorthfieldLiving will [place]Living will HC[place]villeLiving will HM[place]Living will   Copy of HG[place]varin Chart? No - copy requested Yes - validated most recent copy scanned in chart (See row information) Yes - validated most recent copy scanned in chart (See row information) No - copy requested  No - copy requested   Would patient like information on creating a medical advance directive?       No - Patient declined    Current Medications (verified) Outpatient Encounter Medications as of 06/12/2022  Medication Sig   5-Hydroxytryptophan (5-HTP) 100 MG CAPS Take 100 mg by mouth at bedtime.    aspirin EC 81 MG tablet Take 81 mg by mouth daily.   chlorpheniramine-HYDROcodone (TUSSIONEX) 10-8 MG/5ML Take 5 mLs  by mouth every 12 (twelve) hours as needed for cough.   Cholecalciferol (VITAMIN D3 PO) Take 1 capsule by mouth daily.   Coenzyme Q10 (CO Q 10) 100 MG CAPS Take 100 mg by mouth daily.   diphenhydrAMINE (BENADRYL) 25 MG tablet Take 25 mg by mouth at bedtime as needed.   finasteride (PROSCAR) 5 MG tablet Take 1 tablet (5 mg total) by mouth daily.   Flaxseed, Linseed, (FLAXSEED OIL PO) Take 1 each by mouth See admin instructions. Take 15 ml by mouth daily   Green Tea, Camellia sinensis, (GREEN TEA EXTRACT PO) Take by mouth.   Homeopathic Products (LEG CRAMP RELIEF PO) Take 2 tablets by mouth at bedtime.   L-Theanine 100 MG CAPS Take 100 mg by mouth 2 (two) times daily.   MAGNESIUM PO Take 300 mg by mouth at bedtime.    Melatonin 3 MG CAPS Take 3 mg by mouth at bedtime.    Misc Natural Products (BETA-SITOSTEROL PLANT STEROLS) CAPS Take 1 capsule by mouth daily.   Misc Natural Products (GLUCOSAMINE CHOND MSM FORMULA PO) Take 2 capsules by mouth 2 (two) times daily.   Multiple Vitamin (MULTIVITAMIN) tablet Take 1 tablet by mouth daily.   NON FORMULARY Take 1 tablet by mouth daily. AMPK Supplement   Omega-3 Fatty Acids (FISH OIL) 1000 MG CAPS Take 1,000 mg by mouth daily.    OVER THE COUNTER MEDICATION Take 1 capsule by mouth daily. Bacopa Supplement   OVER THE COUNTER MEDICATION Neuromag  OVER THE COUNTER MEDICATION astaxanthim   OVER THE COUNTER MEDICATION Macuguard for eyes   Polyvinyl Alcohol-Povidone (REFRESH OP) Place 2 drops into both eyes as needed (for dry eyes).   Probiotic Product (PROBIOTIC DAILY PO) Take 1 capsule by mouth daily.    tamsulosin (FLOMAX) 0.4 MG CAPS capsule Take 1 capsule (0.4 mg total) by mouth daily.   Turmeric 500 MG CAPS Take by mouth.   zinc gluconate 50 MG tablet Take 50 mg by mouth daily.   diltiazem (CARDIZEM) 30 MG tablet Take 1 tablet (30 mg total) by mouth every 6 (six) hours as needed (palpitations). (Patient not taking: Reported on 06/12/2022)   LITHIUM PO  Take 1,000 mcg by mouth. From [employer] [facility]   predniSONE (STERAPRED UNI-PAK 21 TAB) 10 MG (21) TBPK tablet Take as directed on package.  (60 mg po on day 1, 50 mg po on day 2...) (Patient not taking: Reported on 06/12/2022)   No facility-administered encounter medications on file as of 06/12/2022.    Allergies (verified) Patient has no known allergies.   History: Past Medical History:  Diagnosis Date   Allergic rhinitis, cause unspecified    Atrial fibrillation (HCC)    CHF (congestive heart failure) (HCC)    COPD (chronic obstructive pulmonary disease) ([place])    pt denies   Diverticulosis of colon (without mention of hemorrhage)    Essential hypertension, benign    pt denies   Hyperlipidemia    Impotence of organic origin    Mild anxiety    Personal history of COVID-19    Skin cancer    Unspecified venous (peripheral) insufficiency    Past Surgical History:  Procedure Laterality Date   ATRIAL FIBRILLATION ABLATION N/A 03/03/2017   Procedure: Atrial Fibrillation Ablation;  Surgeon: [name], MD;  Location: [facility];  Service: Cardiovascular;  Laterality: N/A;   COLONOSCOPY WITH PROPOFOL N/A 11/20/2014   Procedure: COLONOSCOPY WITH PROPOFOL;  Surgeon: Manya Silvas, MD;  Location: [facility] END[facility];  Service: Endoscopy;  Laterality: N/A;   rib cartilage     benign tumor   SKIN CANCER EXCISION Right 06/2010   lower leg   TONSILLECTOMY AND ADENOIDECTOMY     VEIN LIGATION AND STRIPPING     Family History  Problem Relation Age of Onset   Suicidality Mother    Leukemia Father    Hypercholesterolemia Father    Hypertension Brother    Hyperlipidemia Brother    Cirrhosis Brother    Colon cancer Maternal Grandmother    Social History   Socioeconomic History   Marital status: Married    Spouse name: Neoma Laming   Number of children: 3   Years of education: 14   Highest education level: Not on file  Occupational History   Occupation: Dealer  Tobacco Use   Smoking status: Every Day    Packs/day: 1.00    Years: 35.00    Total pack years: 35.00    Types: Cigarettes, Cigars   Smokeless tobacco: Never   Tobacco comments:    patient uses natural tobacco (rolls his own)  Vaping Use   Vaping Use: Never used  Substance and Sexual Activity   Alcohol use: Yes    Alcohol/week: 1.0 standard drink of alcohol    Types: 1 Glasses of wine per week    Comment: 2-3 glasses of wine at night   Drug use: No   Sexual activity: Yes    Partners: Female    Birth control/protection:  None  Other Topics Concern   Not on file  Social History Narrative   Pt lives in [place] with spouse.  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   Social Determinants of Health   Financial Resource Strain: Low Risk  (06/12/2022)   Overall Financial Resource Strain (CARDIA)    Difficulty of Paying Living Expenses: Not hard at all  Food Insecurity: No Food Insecurity (06/12/2022)   Hunger Vital Sign    Worried About Running Out of Food in the Last Year: Never true    Ran Out of Food in the Last Year: Never true  Transportation Needs: No Transportation Needs (06/12/2022)   PRAPARE - Hydrologist (Medical): No    Lack of Transportation (Non-Medical): No  Physical Activity: Sufficiently Active (07/22/2021)   Exercise Vital Sign    Days of Exercise per Week: 5 days    Minutes of Exercise per Session: 50 min  Stress: No Stress Concern Present (06/12/2022)   [place]    Feeling of Stress : Not at all  Social Connections: [place] (06/12/2022)   Social Connection and Isolation Panel [NHANES]    Frequency of Communication with Friends and Family: More than three times a week    Frequency of Social Gatherings with Friends and Family: Twice a week    Attends Religious Services: More than 4 times per year    Active Member of [employer] or  Organizations: Yes    Attends Archivist Meetings: 1 to 4 times per year    Marital Status: Married    Tobacco Counseling Ready to quit: Not Answered Counseling given: Not Answered Tobacco comments: patient uses natural tobacco (rolls his own)   Clinical Intake:  Pre-visit preparation completed: Yes  Pain : No/denies pain     BMI - recorded: 23.53 Nutritional Status: BMI of 19-24  Normal Nutritional Risks: None Diabetes: No  How often do you need to have someone help you when you read instructions, pamphlets, or other written materials from your doctor or pharmacy?: 1 - Never  Diabetic?no  Interpreter Needed?: No  Information entered by :: B.Garcia Dalzell,LPN   Activities of Daily Living    06/12/2022   10:39 AM 04/25/2022    7:59 AM  In your present state of health, do you have any difficulty performing the following activities:  Hearing? 0 0  Vision? 0 0  Difficulty concentrating or making decisions? 0 0  Walking or climbing stairs? 0 0  Dressing or bathing? 0 0  Doing errands, shopping? 0 0  Preparing Food and eating ? N   Using the Toilet? N   In the past six months, have you accidently leaked urine? N   Do you have problems with loss of bowel control? N   Managing your Medications? N   Managing your Finances? N   Housekeeping or managing your Housekeeping? N     Patient Care Team: Steele Sizer, MD as PCP - General (Family Medicine) Wellington Hampshire, MD as Consulting Physician (Cardiology) [name], MD (Inactive) as Consulting Physician (Cardiology) Ree Edman, MD (Dermatology) [facility], [facility] Margaretha Sheffield, MD (Otolaryngology)  Indicate any recent Medical Services you may have received from other than [facility] providers in the past year (date may be approximate).     Assessment:   This is a routine wellness examination for [place].  Hearing/Vision screen Hearing Screening - Comments:: Adequate hearing Vision  Screening - Comments:: Adequate vision;readers only. UTD w/[facility]   Dietary issues and exercise activities discussed: Current Exercise Habits: Home exercise routine;Structured exercise class, Type of exercise: walking, Time (Minutes): > 60, Frequency (Times/Week): 5, Weekly Exercise (Minutes/Week): 0, Exercise limited by: None identified   Goals Addressed   None    Depression Screen    06/12/2022   10:29 AM 04/25/2022    7:59 AM 04/07/2022    2:36 PM 07/22/2021   10:29 AM 06/11/2021   10:57 AM 03/13/2021    1:11 PM 02/26/2021   11:03 AM  PHQ 2/9 Scores  PHQ - 2 Score 0 0 0 0 0 0 0  PHQ- 9 Score   0 2  0     Fall Risk    06/12/2022   10:26 AM 04/25/2022    7:59 AM 04/07/2022    2:36 PM 07/22/2021   10:28 AM 06/11/2021   11:06 AM  Fall Risk   Falls in the past year? 0 0 0 0 0  Number falls in past yr: 0 0  0 0  Injury with Fall? 0 0  0 0  Risk for fall due to : No Fall Risks  No Fall Risks No Fall Risks No Fall Risks  Follow up Education provided;Falls prevention discussed Falls evaluation completed Falls prevention discussed;Education provided;Falls evaluation completed Falls prevention discussed Falls prevention discussed    FALL RISK PREVENTION PERTAINING TO THE HOME:  Any stairs in or around the home? Yes  If so, are there any without handrails? Yes  Home free of loose throw rugs in walkways, pet beds, electrical cords, etc? Yes  Adequate lighting in your home to reduce risk of falls? Yes   ASSISTIVE DEVICES UTILIZED TO PREVENT FALLS:  Life alert? No  Use of a cane, walker or w/c? No  Grab bars in the bathroom? Yes  Shower chair or bench in shower? No  Elevated toilet seat or a handicapped toilet? Yes    Cognitive Function:        06/12/2022   10:42 AM 05/14/2018    9:18 AM  6CIT Screen  What Year? 0 points 0 points  What month? 0 points 0 points  What time? 0 points 0 points  Count back from 20 0 points 0 points  Months in reverse 0 points 0 points   Repeat phrase 0 points 0 points  Total Score 0 points 0 points    Immunizations Immunization History  Administered Date(s) Administered   Fluad Quad(high Dose 65+) 02/26/2021   Influenza Split 03/22/2008   Influenza, High Dose Seasonal PF 03/10/2018, 03/07/2019   Influenza, Seasonal, Injecte, Preservative Fre 02/27/2012, 07/12/2012   Influenza-Unspecified 03/23/2015, 04/01/2016, 02/16/2017   Pneumococcal Conjugate-13 08/28/2014   Pneumococcal Polysaccharide-23 08/03/2012   Td 06/17/2011   Zoster, Live 03/28/2010    TDAP status: Up to date  Flu Vaccine status: Declined, Education has been provided regarding the importance of this vaccine but patient still declined. Advised may receive this vaccine at local pharmacy or Health Dept. Aware to provide a copy of the vaccination record if obtained from local pharmacy or Health Dept. Verbalized acceptance and understanding.  Pneumococcal vaccine status: Up to date  Covid-19 vaccine status: Declined, Education has been provided regarding the importance of this vaccine but patient still declined. Advised may receive this vaccine at local pharmacy or Health Dept.or vaccine clinic. Aware to provide a copy of the vaccination record if obtained from local pharmacy or Health Dept. Verbalized acceptance and understanding.  Qualifies for Shingles Vaccine? Yes   Zostavax completed No   Shingrix Completed?: No.    Education has been provided regarding the importance of this vaccine. Patient has been advised to call insurance company to determine out of pocket expense if they have not yet received this vaccine. Advised may also receive vaccine at local pharmacy or Health Dept. Verbalized acceptance and understanding.  Screening Tests Health Maintenance  Topic Date Due   COVID-19 Vaccine (1) Never done   DTaP/Tdap/Td (2 - Tdap) 06/16/2021   Zoster Vaccines- Shingrix (1 of 2) 07/07/2022 (Originally 06/15/1964)   INFLUENZA VACCINE  07/27/2022  (Originally 11/26/2021)   Lung Cancer Screening  08/14/2022   Medicare Annual Wellness (AWV)  06/13/2023   COLONOSCOPY (Pts 45-40yr Insurance coverage will need to be confirmed)  11/19/2024   Pneumonia Vaccine 77 Years old  Completed   Hepatitis C Screening  Completed   HPV VACCINES  Aged Out    Health Maintenance  Health Maintenance Due  Topic Date Due   COVID-19 Vaccine (1) Never done   DTaP/Tdap/Td (2 - Tdap) 06/16/2021    Colorectal cancer screening: No longer required.   Lung Cancer Screening: (Low Dose CT Chest recommended if Age 442-80years, 30 pack-year currently smoking OR have quit w/in 15years.) does qualify.   Lung Cancer Screening Referral: yes  Additional Screening:  Hepatitis C Screening: does not qualify; Completed yes  Vision Screening: Recommended annual ophthalmology exams for early detection of glaucoma and other disorders of the eye. Is the patient up to date with their annual eye exam?  Yes  Who is the provider or what is the name of the office in which the patient attends annual eye exams? Eye If pt is not established with a provider, would they like to be referred to a provider to establish care? No .   Dental Screening: Recommended annual dental exams for proper oral hygiene  Community Resource Referral / Chronic Care Management: CRR required this visit?  No   CCM required this visit?  No      Plan:     I have personally reviewed and noted the following in the patient's chart:   Medical and social history Use of alcohol, tobacco or illicit drugs  Current medications and supplements including opioid prescriptions. Patient is not currently taking opioid prescriptions. Functional ability and status Nutritional status Physical activity Advanced directives List of other physicians Hospitalizations, surgeries, and ER visits in previous 12 months Vitals Screenings to include cognitive, depression, and falls Referrals and appointments  In  addition, I have reviewed and discussed with patient certain preventive protocols, quality metrics, and best practice recommendations. A written personalized care plan for preventive services as well as general preventive health recommendations were provided to patient.     [facility] LPN   [id number]  Nurse Notes: pt says he is doing well:has no questions or concerns

## 2022-06-12 NOTE — Patient Instructions (Signed)
Steven Griffin , Thank you for taking time to come for your Medicare Wellness Visit. I appreciate your ongoing commitment to your health goals. Please review the following plan we discussed and let me know if I can assist you in the future.   These are the goals we discussed:  Goals      Patient Stated     Maintain active and healthy lifestyle        This is a list of the screening recommended for you and due dates:  Health Maintenance  Topic Date Due   COVID-19 Vaccine (1) Never done   DTaP/Tdap/Td vaccine (2 - Tdap) 06/16/2021   Zoster (Shingles) Vaccine (1 of 2) 07/07/2022*   Flu Shot  07/27/2022*   Screening for Lung Cancer  08/14/2022   Medicare Annual Wellness Visit  06/13/2023   Colon Cancer Screening  11/19/2024   Pneumonia Vaccine  Completed   Hepatitis C Screening: USPSTF Recommendation to screen - Ages 18-79 yo.  Completed   HPV Vaccine  Aged Out  *Topic was postponed. The date shown is not the original due date.    Advanced directives: yes  Conditions/risks identified: none  Next appointment: Follow up in one year for your annual wellness visit. 06/16/2023 @0945$  telephone  Preventive Care 75 Years and Older, Male  Preventive care refers to lifestyle choices and visits with your health care provider that can promote health and wellness. What does preventive care include? A yearly physical exam. This is also called an annual well check. Dental exams once or twice a year. Routine eye exams. Ask your health care provider how often you should have your eyes checked. Personal lifestyle choices, including: Daily care of your teeth and gums. Regular physical activity. Eating a healthy diet. Avoiding tobacco and drug use. Limiting alcohol use. Practicing safe sex. Taking low doses of aspirin every day. Taking vitamin and mineral supplements as recommended by your health care provider. What happens during an annual well check? The services and screenings done by your  health care provider during your annual well check will depend on your age, overall health, lifestyle risk factors, and family history of disease. Counseling  Your health care provider may ask you questions about your: Alcohol use. Tobacco use. Drug use. Emotional well-being. Home and relationship well-being. Sexual activity. Eating habits. History of falls. Memory and ability to understand (cognition). Work and work Statistician. Screening  You may have the following tests or measurements: Height, weight, and BMI. Blood pressure. Lipid and cholesterol levels. These may be checked every 5 years, or more frequently if you are over 6 years old. Skin check. Lung cancer screening. You may have this screening every year starting at age 16 if you have a 30-pack-year history of smoking and currently smoke or have quit within the past 15 years. Fecal occult blood test (FOBT) of the stool. You may have this test every year starting at age 19. Flexible sigmoidoscopy or colonoscopy. You may have a sigmoidoscopy every 5 years or a colonoscopy every 10 years starting at age 40. Prostate cancer screening. Recommendations will vary depending on your family history and other risks. Hepatitis C blood test. Hepatitis B blood test. Sexually transmitted disease (STD) testing. Diabetes screening. This is done by checking your blood sugar (glucose) after you have not eaten for a while (fasting). You may have this done every 1-3 years. Abdominal aortic aneurysm (AAA) screening. You may need this if you are a current or former smoker. Osteoporosis. You may be  screened starting at age 73 if you are at high risk. Talk with your health care provider about your test results, treatment options, and if necessary, the need for more tests. Vaccines  Your health care provider may recommend certain vaccines, such as: Influenza vaccine. This is recommended every year. Tetanus, diphtheria, and acellular pertussis  (Tdap, Td) vaccine. You may need a Td booster every 10 years. Zoster vaccine. You may need this after age 38. Pneumococcal 13-valent conjugate (PCV13) vaccine. One dose is recommended after age 45. Pneumococcal polysaccharide (PPSV23) vaccine. One dose is recommended after age 3. Talk to your health care provider about which screenings and vaccines you need and how often you need them. This information is not intended to replace advice given to you by your health care provider. Make sure you discuss any questions you have with your health care provider. Document Released: 05/11/2015 Document Revised: 01/02/2016 Document Reviewed: 02/13/2015 Elsevier Interactive Patient Education  2017 [place] Prevention in the Home Falls can cause injuries. They can happen to people of all ages. There are many things you can do to make your home safe and to help prevent falls. What can I do on the outside of my home? Regularly fix the edges of walkways and driveways and fix any cracks. Remove anything that might make you trip as you walk through a door, such as a raised step or threshold. Trim any bushes or trees on the path to your home. Use bright outdoor lighting. Clear any walking paths of anything that might make someone trip, such as rocks or tools. Regularly check to see if handrails are loose or broken. Make sure that both sides of any steps have handrails. Any raised decks and porches should have guardrails on the edges. Have any leaves, snow, or ice cleared regularly. Use sand or salt on walking paths during winter. Clean up any spills in your garage right away. This includes oil or grease spills. What can I do in the bathroom? Use night lights. Install grab bars by the toilet and in the tub and shower. Do not use towel bars as grab bars. Use non-skid mats or decals in the tub or shower. If you need to sit down in the shower, use a plastic, non-slip stool. Keep the floor dry. Clean  up any water that spills on the floor as soon as it happens. Remove soap buildup in the tub or shower regularly. Attach bath mats securely with double-sided non-slip rug tape. Do not have throw rugs and other things on the floor that can make you trip. What can I do in the bedroom? Use night lights. Make sure that you have a light by your bed that is easy to reach. Do not use any sheets or blankets that are too big for your bed. They should not hang down onto the floor. Have a firm chair that has side arms. You can use this for support while you get dressed. Do not have throw rugs and other things on the floor that can make you trip. What can I do in the kitchen? Clean up any spills right away. Avoid walking on wet floors. Keep items that you use a lot in easy-to-reach places. If you need to reach something above you, use a strong step stool that has a grab bar. Keep electrical cords out of the way. Do not use floor polish or wax that makes floors slippery. If you must use wax, use non-skid floor wax. Do not have  throw rugs and other things on the floor that can make you trip. What can I do with my stairs? Do not leave any items on the stairs. Make sure that there are handrails on both sides of the stairs and use them. Fix handrails that are broken or loose. Make sure that handrails are as long as the stairways. Check any carpeting to make sure that it is firmly attached to the stairs. Fix any carpet that is loose or worn. Avoid having throw rugs at the top or bottom of the stairs. If you do have throw rugs, attach them to the floor with carpet tape. Make sure that you have a light switch at the top of the stairs and the bottom of the stairs. If you do not have them, ask someone to add them for you. What else can I do to help prevent falls? Wear shoes that: Do not have high heels. Have rubber bottoms. Are comfortable and fit you well. Are closed at the toe. Do not wear sandals. If you  use a stepladder: Make sure that it is fully opened. Do not climb a closed stepladder. Make sure that both sides of the stepladder are locked into place. Ask someone to hold it for you, if possible. Clearly mark and make sure that you can see: Any grab bars or handrails. First and last steps. Where the edge of each step is. Use tools that help you move around (mobility aids) if they are needed. These include: Canes. Walkers. Scooters. Crutches. Turn on the lights when you go into a dark area. Replace any light bulbs as soon as they burn out. Set up your furniture so you have a clear path. Avoid moving your furniture around. If any of your floors are uneven, fix them. If there are any pets around you, be aware of where they are. Review your medicines with your doctor. Some medicines can make you feel dizzy. This can increase your chance of falling. Ask your doctor what other things that you can do to help prevent falls. This information is not intended to replace advice given to you by your health care provider. Make sure you discuss any questions you have with your health care provider. Document Released: 02/08/2009 Document Revised: 09/20/2015 Document Reviewed: 05/19/2014 Elsevier Interactive Patient Education  2017 [employer].

## 2022-07-10 DIAGNOSIS — M5137 Other intervertebral disc degeneration, lumbosacral region: Secondary | ICD-10-CM | POA: Diagnosis not present

## 2022-07-10 DIAGNOSIS — M9903 Segmental and somatic dysfunction of lumbar region: Secondary | ICD-10-CM | POA: Diagnosis not present

## 2022-07-10 DIAGNOSIS — M9901 Segmental and somatic dysfunction of cervical region: Secondary | ICD-10-CM | POA: Diagnosis not present

## 2022-07-10 DIAGNOSIS — M531 Cervicobrachial syndrome: Secondary | ICD-10-CM | POA: Diagnosis not present

## 2022-07-10 DIAGNOSIS — M546 Pain in thoracic spine: Secondary | ICD-10-CM | POA: Diagnosis not present

## 2022-07-10 DIAGNOSIS — M9902 Segmental and somatic dysfunction of thoracic region: Secondary | ICD-10-CM | POA: Diagnosis not present

## 2022-07-25 ENCOUNTER — Encounter: Payer: Medicare HMO | Admitting: Family Medicine

## 2022-07-28 NOTE — Progress Notes (Unsigned)
Name: Steven Griffin   MRN: [id number]    DOB: Oct 19, 1945   Date:07/29/2022       Progress Note  Subjective  Chief Complaint  Annual Exam  HPI  Patient presents for annual CPE.  IPSS Questionnaire (AUA-7): Over the past month.   1)  How often have you had a sensation of not emptying your bladder completely after you finish urinating?  0 - Not at all  2)  How often have you had to urinate again less than two hours after you finished urinating? 0 - Not at all  3)  How often have you found you stopped and started again several times when you urinated?  0 - Not at all  4) How difficult have you found it to postpone urination?  0 - Not at all  5) How often have you had a weak urinary stream?  0 - Not at all  6) How often have you had to push or strain to begin urination?  0 - Not at all  7) How many times did you most typically get up to urinate from the time you went to bed until the time you got up in the morning?  0 - None  Total score:  0-7 mildly symptomatic   8-19 moderately symptomatic   20-35 severely symptomatic    He is taking an herbal supplement and feeling better, he is not sure of the name   Diet: he eats healthy Exercise: continue regular physical activity  Last Dental Exam: up to date  Last Eye Exam: up to date   Depression: phq 9 is negative    07/29/2022   10:20 AM 06/12/2022   10:29 AM 04/25/2022    7:59 AM 04/07/2022    2:36 PM 07/22/2021   10:29 AM  Depression screen PHQ 2/9  Decreased Interest 0 0 0 0 0  Down, Depressed, Hopeless 0 0 0 0 0  PHQ - 2 Score 0 0 0 0 0  Altered sleeping 0   0 0  Tired, decreased energy 0   0 0  Change in appetite 0   0 0  Feeling bad or failure about yourself  0   0 0  Trouble concentrating 0   0 2  Moving slowly or fidgety/restless 0   0 0  Suicidal thoughts 0   0 0  PHQ-9 Score 0   0 2    Hypertension:  BP Readings from Last 3 Encounters:  07/29/22 120/74  04/07/22 120/72  09/05/21 126/84    Obesity: Wt Readings  from Last 3 Encounters:  07/29/22 168 lb (76.2 kg)  06/12/22 164 lb (74.4 kg)  04/07/22 164 lb 4.8 oz (74.5 kg)   BMI Readings from Last 3 Encounters:  07/29/22 24.11 kg/m  06/12/22 23.53 kg/m  04/07/22 23.57 kg/m     Lipids:  Lab Results  Component Value Date   CHOL 219 (A) 08/26/2020   CHOL 204 (A) 09/14/2019   CHOL 185 09/02/2018   Lab Results  Component Value Date   HDL 64 08/26/2020   HDL 63 09/14/2019   HDL 55 09/02/2018   Lab Results  Component Value Date   LDLCALC 138 08/26/2020   LDLCALC 118 09/14/2019   LDLCALC 108 09/02/2018   Lab Results  Component Value Date   TRIG 97 08/26/2020   TRIG 131 09/14/2019   TRIG 112 09/02/2018   Lab Results  Component Value Date   CHOLHDL 3.0 08/31/2015   No results found  for: "LDLDIRECT" Glucose:  Glucose  Date Value Ref Range Status  04/12/2021 106 (H) 70 - 99 mg/dL Final  02/20/2017 92 65 - 99 mg/dL Final  12/18/2016 85 65 - 99 mg/dL Final    Flowsheet Row Clinical Support from 06/12/2022 in [facility]  AUDIT-C Score 0       Married STD testing and prevention (HIV/chl/gon/syphilis): N/A Sexual history: doing well, one partner  Hep C Screening: 08/31/15 Skin cancer: Discussed monitoring for atypical lesions Colorectal cancer: 11/20/14 Prostate cancer:   Lab Results  Component Value Date   PSA 1.5 02/26/2021   PSA 2.3 09/14/2019   PSA 2.2 09/02/2018   He gets it checked by integrative medicine    Lung cancer:  Low Dose CT Chest recommended if Age 36-80 years, 30 pack-year currently smoking OR have quit w/in 15years.  He is having it done yearly  AAA: The USPSTF recommends one-time screening with ultrasonography in men ages 75 to 23 years who have ever smoked. Patient   no, a candidate for screening  ECG:  09/05/21  Vaccines:   RSV: he will get it at local pharmacy  Tdap: 20113, due now, advised to go to local pharmacy  Shingrix: he will get it a local pharmacy    Pneumonia: up to date Flu: 2022, refused  COVID-19: N/A  Advanced Care Planning: A voluntary discussion about advance care planning including the explanation and discussion of advance directives.  Discussed health care proxy and Living will, and the patient was able to identify a health care proxy as wife .  Patient has a living will and power of attorney of health care   Patient Active Problem List   Diagnosis Date Noted   Snoring 09/05/2021   Pulmonary hypertension, unspecified 09/05/2021   DDD (degenerative disc disease), lumbosacral 02/26/2021   Benign prostatic hyperplasia with urinary obstruction 02/26/2021   Centrilobular emphysema 02/26/2021   Coronary artery disease involving native coronary artery of native heart without angina pectoris 12/13/2019   Atherosclerosis of aorta 12/13/2019   Senile purpura 12/13/2019   Personal history of tobacco use, presenting hazards to health 11/25/2018   Essential hypertension, benign 02/23/2018   Superficial thrombophlebitis 02/23/2018   Phlebitis of left lower extremity 01/26/2018   Paroxysmal atrial fibrillation 03/03/2017   Bradycardia 08/31/2015   Muscle tear 04/20/2015   Allergic rhinitis, seasonal 04/20/2015   Chronic venous insufficiency 10/27/2014   Intermittent tremor 10/27/2014   ED (erectile dysfunction) of non-organic origin 10/27/2014   Colon, diverticulosis 10/27/2014   Benign prostatic hypertrophy without urinary obstruction 10/27/2014   Bundle branch block, right and left anterior fascicular 06/07/2013   Atrial fibrillation 10/20/2012   Obstructive apnea 03/23/2012   Polypharmacy 04/15/2011   On dofetilide therapy 04/15/2011   Hypercholesteremia 01/18/2011   H/O squamous cell carcinoma of skin 01/18/2011   History of major vascular surgery 01/18/2011    Past Surgical History:  Procedure Laterality Date   ATRIAL FIBRILLATION ABLATION N/A 03/03/2017   Procedure: Atrial Fibrillation Ablation;  Surgeon: [name],  MD;  Location: [facility];  Service: Cardiovascular;  Laterality: N/A;   COLONOSCOPY WITH PROPOFOL N/A 11/20/2014   Procedure: COLONOSCOPY WITH PROPOFOL;  Surgeon: Manya Silvas, MD;  Location: [facility] END[facility];  Service: Endoscopy;  Laterality: N/A;   rib cartilage     benign tumor   SKIN CANCER EXCISION Right 06/2010   lower leg   TONSILLECTOMY AND ADENOIDECTOMY     VEIN LIGATION AND STRIPPING  Family History  Problem Relation Age of Onset   Suicidality Mother    Leukemia Father    Hypercholesterolemia Father    Hypertension Brother    Hyperlipidemia Brother    Cirrhosis Brother    Colon cancer Maternal Grandmother     Social History   Socioeconomic History   Marital status: Married    Spouse name: Neoma Laming   Number of children: 3   Years of education: 14   Highest education level: Not on file  Occupational History   Occupation: Product manager  Tobacco Use   Smoking status: Every Day    Packs/day: 1.00    Years: 35.00    Additional pack years: 0.00    Total pack years: 35.00    Types: Cigarettes, Cigars   Smokeless tobacco: Never   Tobacco comments:    patient uses natural tobacco (rolls his own)  Vaping Use   Vaping Use: Never used  Substance and Sexual Activity   Alcohol use: Yes    Alcohol/week: 1.0 standard drink of alcohol    Types: 1 Glasses of wine per week    Comment: 2-3 glasses of wine at night   Drug use: No   Sexual activity: Yes    Partners: Female    Birth control/protection: None  Other Topics Concern   Not on file  Social History Narrative   Pt lives in [place] with spouse.  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   Social Determinants of Health   Financial Resource Strain: Low Risk  (07/29/2022)   Overall Financial Resource Strain (CARDIA)    Difficulty of Paying Living Expenses: Not hard at all  Food Insecurity: No Food Insecurity (07/29/2022)   Hunger Vital Sign    Worried About Running Out of  Food in the Last Year: Never true    Ran Out of Food in the Last Year: Never true  Transportation Needs: No Transportation Needs (07/29/2022)   PRAPARE - Hydrologist (Medical): No    Lack of Transportation (Non-Medical): No  Physical Activity: Sufficiently Active (07/29/2022)   Exercise Vital Sign    Days of Exercise per Week: 5 days    Minutes of Exercise per Session: 60 min  Stress: No Stress Concern Present (07/29/2022)   [place]    Feeling of Stress : Only a little  Social Connections: Socially Integrated (07/29/2022)   Social Connection and Isolation Panel [NHANES]    Frequency of Communication with Friends and Family: More than three times a week    Frequency of Social Gatherings with Friends and Family: More than three times a week    Attends Religious Services: More than 4 times per year    Active Member of [employer] or Organizations: Yes    Attends Archivist Meetings: 1 to 4 times per year    Marital Status: Married  Human resources officer Violence: Not At Risk (07/29/2022)   Humiliation, Afraid, Rape, and Kick questionnaire    Fear of Current or Ex-Partner: No    Emotionally Abused: No    Physically Abused: No    Sexually Abused: No     Current Outpatient Medications:    5-Hydroxytryptophan (5-HTP) 100 MG CAPS, Take 100 mg by mouth at bedtime. , Disp: , Rfl:    aspirin EC 81 MG tablet, Take 81 mg by mouth daily., Disp: , Rfl:    chlorpheniramine-HYDROcodone (TUSSIONEX) 10-8 MG/5ML, Take 5 mLs  by mouth every 12 (twelve) hours as needed for cough., Disp: 115 mL, Rfl: 0   Cholecalciferol (VITAMIN D3 PO), Take 1 capsule by mouth daily., Disp: , Rfl:    Coenzyme Q10 (CO Q 10) 100 MG CAPS, Take 100 mg by mouth daily., Disp: , Rfl:    diltiazem (CARDIZEM) 30 MG tablet, Take 1 tablet (30 mg total) by mouth every 6 (six) hours as needed (palpitations)., Disp: 30 tablet, Rfl: 1    diphenhydrAMINE (BENADRYL) 25 MG tablet, Take 25 mg by mouth at bedtime as needed., Disp: , Rfl:    finasteride (PROSCAR) 5 MG tablet, Take 1 tablet (5 mg total) by mouth daily., Disp: 90 tablet, Rfl: 3   Flaxseed, Linseed, (FLAXSEED OIL PO), Take 1 each by mouth See admin instructions. Take 15 ml by mouth daily, Disp: , Rfl:    Green Tea, Camellia sinensis, (GREEN TEA EXTRACT PO), Take by mouth., Disp: , Rfl:    Homeopathic Products (LEG CRAMP RELIEF PO), Take 2 tablets by mouth at bedtime., Disp: , Rfl:    L-Theanine 100 MG CAPS, Take 100 mg by mouth 2 (two) times daily., Disp: , Rfl:    LITHIUM PO, Take 1,000 mcg by mouth. From life extension foundation, Disp: , Rfl:    MAGNESIUM PO, Take 300 mg by mouth at bedtime. , Disp: , Rfl:    Melatonin 3 MG CAPS, Take 3 mg by mouth at bedtime. , Disp: , Rfl:    Misc Natural Products (BETA-SITOSTEROL PLANT STEROLS) CAPS, Take 1 capsule by mouth daily., Disp: , Rfl:    Misc Natural Products (GLUCOSAMINE CHOND MSM FORMULA PO), Take 2 capsules by mouth 2 (two) times daily., Disp: , Rfl:    Multiple Vitamin (MULTIVITAMIN) tablet, Take 1 tablet by mouth daily., Disp: , Rfl:    Omega-3 Fatty Acids (FISH OIL) 1000 MG CAPS, Take 1,000 mg by mouth daily. , Disp: , Rfl:    Polyvinyl Alcohol-Povidone (REFRESH OP), Place 2 drops into both eyes as needed (for dry eyes)., Disp: , Rfl:    Probiotic Product (PROBIOTIC DAILY PO), Take 1 capsule by mouth daily. , Disp: , Rfl:    tamsulosin (FLOMAX) 0.4 MG CAPS capsule, Take 1 capsule (0.4 mg total) by mouth daily., Disp: 90 capsule, Rfl: 3   Turmeric 500 MG CAPS, Take by mouth., Disp: , Rfl:    zinc gluconate 50 MG tablet, Take 50 mg by mouth daily., Disp: , Rfl:   No Known Allergies   ROS  Constitutional: Negative for fever or weight change.  Respiratory: Negative for cough and shortness of breath.   Cardiovascular: Negative for chest pain or palpitations.  Gastrointestinal: Negative for abdominal pain, no bowel  changes.  Musculoskeletal: Negative for gait problem or joint swelling.  Skin: Negative for rash.  Neurological: Negative for dizziness or headache.  No other specific complaints in a complete review of systems (except as listed in HPI above).    Objective  Vitals:   07/29/22 1021  BP: 120/74  Pulse: 75  Resp: 16  SpO2: 94%  Weight: 168 lb (76.2 kg)  Height: 5\' 10"  (1.778 m)    Body mass index is 24.11 kg/m.  Physical Exam  Constitutional: Patient appears well-developed and well-nourished. No distress.  HENT: Head: Normocephalic and atraumatic. Ears: B TMs ok, no erythema or effusion; Nose: Nose normal. Mouth/Throat: Oropharynx is clear and moist. No oropharyngeal exudate.  Eyes: Conjunctivae and EOM are normal. Pupils are equal, round, and reactive to light. No scleral  icterus.  Neck: Normal range of motion. Neck supple. No JVD present. No thyromegaly present.  Cardiovascular: Normal rate, regular rhythm and normal heart sounds.  No murmur heard. No BLE edema. Pulmonary/Chest: Effort normal and breath sounds normal. No respiratory distress. Abdominal: Soft. Bowel sounds are normal, no distension. There is no tenderness. no masses MALE GENITALIA: Normal descended testes bilaterally, no masses palpated, no hernias, no lesions, no discharge RECTAL: not done  Musculoskeletal: Normal range of motion, no joint effusions. No gross deformities Neurological: he is alert and oriented to person, place, and time. No cranial nerve deficit. Coordination, balance, strength, speech and gait are normal.  Skin: Skin is warm and dry. No rash noted. No erythema.  Psychiatric: Patient has a normal mood and affect. behavior is normal. Judgment and thought content normal.   Fall Risk:    07/29/2022   10:20 AM 06/12/2022   10:26 AM 04/25/2022    7:59 AM 04/07/2022    2:36 PM 07/22/2021   10:28 AM  Fall Risk   Falls in the past year? 0 0 0 0 0  Number falls in past yr: 0 0 0  0  Injury with  Fall? 0 0 0  0  Risk for fall due to : No Fall Risks No Fall Risks  No Fall Risks No Fall Risks  Follow up Falls prevention discussed Education provided;Falls prevention discussed Falls evaluation completed Falls prevention discussed;Education provided;Falls evaluation completed Falls prevention discussed     Functional Status Survey: Is the patient deaf or have difficulty hearing?: No Does the patient have difficulty seeing, even when wearing glasses/contacts?: No Does the patient have difficulty concentrating, remembering, or making decisions?: Yes Does the patient have difficulty walking or climbing stairs?: No Does the patient have difficulty dressing or bathing?: No Does the patient have difficulty doing errands alone such as visiting a doctor's office or shopping?: No    Assessment & Plan   1. Well adult exam   2. Need for Tdap vaccination   3. Need for RSV immunization   4. Need for shingles vaccine    -Prostate cancer screening and PSA options (with potential risks and benefits of testing vs not testing) were discussed along with recent recs/guidelines. -USPSTF grade A and B recommendations reviewed with patient; age-appropriate recommendations, preventive care, screening tests, etc discussed and encouraged; healthy living encouraged; see AVS for patient education given to patient -Discussed importance of 150 minutes of physical activity weekly, eat two servings of fish weekly, eat one serving of tree nuts ( cashews, pistachios, pecans, almonds.Marland Kitchen) every other day, eat 6 servings of fruit/vegetables daily and drink plenty of water and avoid sweet beverages.  -Reviewed Health Maintenance: yes

## 2022-07-28 NOTE — Patient Instructions (Incomplete)
Your are due for Tdap, shingrix and RSV vaccines at local pharmacy   Preventive Care 72 Years and Older, Male Preventive care refers to lifestyle choices and visits with your health care provider that can promote health and wellness. Preventive care visits are also called wellness exams. What can I expect for my preventive care visit? Counseling During your preventive care visit, your health care provider may ask about your: Medical history, including: Past medical problems. Family medical history. History of falls. Current health, including: Emotional well-being. Home life and relationship well-being. Sexual activity. Memory and ability to understand (cognition). Lifestyle, including: Alcohol, nicotine or tobacco, and drug use. Access to firearms. Diet, exercise, and sleep habits. Work and work Statistician. Sunscreen use. Safety issues such as seatbelt and bike helmet use. Physical exam Your health care provider will check your: Height and weight. These may be used to calculate your BMI (body mass index). BMI is a measurement that tells if you are at a healthy weight. Waist circumference. This measures the distance around your waistline. This measurement also tells if you are at a healthy weight and may help predict your risk of certain diseases, such as type 2 diabetes and high blood pressure. Heart rate and blood pressure. Body temperature. Skin for abnormal spots. What immunizations do I need?  Vaccines are usually given at various ages, according to a schedule. Your health care provider will recommend vaccines for you based on your age, medical history, and lifestyle or other factors, such as travel or where you work. What tests do I need? Screening Your health care provider may recommend screening tests for certain conditions. This may include: Lipid and cholesterol levels. Diabetes screening. This is done by checking your blood sugar (glucose) after you have not eaten for  a while (fasting). Hepatitis C test. Hepatitis B test. HIV (human immunodeficiency virus) test. STI (sexually transmitted infection) testing, if you are at risk. Lung cancer screening. Colorectal cancer screening. Prostate cancer screening. Abdominal aortic aneurysm (AAA) screening. You may need this if you are a current or former smoker. Talk with your health care provider about your test results, treatment options, and if necessary, the need for more tests. Follow these instructions at home: Eating and drinking  Eat a diet that includes fresh fruits and vegetables, whole grains, lean protein, and low-fat dairy products. Limit your intake of foods with high amounts of sugar, saturated fats, and salt. Take vitamin and mineral supplements as recommended by your health care provider. Do not drink alcohol if your health care provider tells you not to drink. If you drink alcohol: Limit how much you have to 0-2 drinks a day. Know how much alcohol is in your drink. In the [place] one drink equals one 12 oz bottle of beer (355 mL), one 5 oz glass of wine (148 mL), or one 1 oz glass of hard liquor (44 mL). Lifestyle Brush your teeth every morning and night with fluoride toothpaste. Floss one time each day. Exercise for at least 30 minutes 5 or more days each week. Do not use any products that contain nicotine or tobacco. These products include cigarettes, chewing tobacco, and vaping devices, such as e-cigarettes. If you need help quitting, ask your health care provider. Do not use drugs. If you are sexually active, practice safe sex. Use a condom or other form of protection to prevent STIs. Take aspirin only as told by your health care provider. Make sure that you understand how much to take and what form to  take. Work with your health care provider to find out whether it is safe and beneficial for you to take aspirin daily. Ask your health care provider if you need to take a cholesterol-lowering  medicine (statin). Find healthy ways to manage stress, such as: Meditation, yoga, or listening to music. Journaling. Talking to a trusted person. Spending time with friends and family. Safety Always wear your seat belt while driving or riding in a vehicle. Do not drive: If you have been drinking alcohol. Do not ride with someone who has been drinking. When you are tired or distracted. While texting. If you have been using any mind-altering substances or drugs. Wear a helmet and other protective equipment during sports activities. If you have firearms in your house, make sure you follow all gun safety procedures. Minimize exposure to UV radiation to reduce your risk of skin cancer. What's next? Visit your health care provider once a year for an annual wellness visit. Ask your health care provider how often you should have your eyes and teeth checked. Stay up to date on all vaccines. This information is not intended to replace advice given to you by your health care provider. Make sure you discuss any questions you have with your health care provider. Document Revised: 10/10/2020 Document Reviewed: 10/10/2020 Elsevier Patient Education  [place].

## 2022-07-29 ENCOUNTER — Encounter: Payer: Self-pay | Admitting: Family Medicine

## 2022-07-29 ENCOUNTER — Ambulatory Visit (INDEPENDENT_AMBULATORY_CARE_PROVIDER_SITE_OTHER): Payer: Medicare HMO | Admitting: Family Medicine

## 2022-07-29 VITALS — BP 120/74 | HR 75 | Resp 16 | Ht 69.5 in | Wt 168.0 lb

## 2022-07-29 DIAGNOSIS — Z2911 Encounter for prophylactic immunotherapy for respiratory syncytial virus (RSV): Secondary | ICD-10-CM

## 2022-07-29 DIAGNOSIS — Z0000 Encounter for general adult medical examination without abnormal findings: Secondary | ICD-10-CM

## 2022-07-29 DIAGNOSIS — Z23 Encounter for immunization: Secondary | ICD-10-CM | POA: Diagnosis not present

## 2022-08-12 DIAGNOSIS — M9902 Segmental and somatic dysfunction of thoracic region: Secondary | ICD-10-CM | POA: Diagnosis not present

## 2022-08-12 DIAGNOSIS — M9903 Segmental and somatic dysfunction of lumbar region: Secondary | ICD-10-CM | POA: Diagnosis not present

## 2022-08-12 DIAGNOSIS — M546 Pain in thoracic spine: Secondary | ICD-10-CM | POA: Diagnosis not present

## 2022-08-12 DIAGNOSIS — M5137 Other intervertebral disc degeneration, lumbosacral region: Secondary | ICD-10-CM | POA: Diagnosis not present

## 2022-08-12 DIAGNOSIS — M531 Cervicobrachial syndrome: Secondary | ICD-10-CM | POA: Diagnosis not present

## 2022-08-12 DIAGNOSIS — M9901 Segmental and somatic dysfunction of cervical region: Secondary | ICD-10-CM | POA: Diagnosis not present

## 2022-08-15 ENCOUNTER — Ambulatory Visit: Payer: Medicare HMO

## 2022-09-17 DIAGNOSIS — M9903 Segmental and somatic dysfunction of lumbar region: Secondary | ICD-10-CM | POA: Diagnosis not present

## 2022-09-17 DIAGNOSIS — M531 Cervicobrachial syndrome: Secondary | ICD-10-CM | POA: Diagnosis not present

## 2022-09-17 DIAGNOSIS — M546 Pain in thoracic spine: Secondary | ICD-10-CM | POA: Diagnosis not present

## 2022-09-17 DIAGNOSIS — M5137 Other intervertebral disc degeneration, lumbosacral region: Secondary | ICD-10-CM | POA: Diagnosis not present

## 2022-09-17 DIAGNOSIS — M9901 Segmental and somatic dysfunction of cervical region: Secondary | ICD-10-CM | POA: Diagnosis not present

## 2022-09-17 DIAGNOSIS — M9902 Segmental and somatic dysfunction of thoracic region: Secondary | ICD-10-CM | POA: Diagnosis not present

## 2022-10-20 DIAGNOSIS — H6123 Impacted cerumen, bilateral: Secondary | ICD-10-CM | POA: Diagnosis not present

## 2022-10-20 DIAGNOSIS — H903 Sensorineural hearing loss, bilateral: Secondary | ICD-10-CM | POA: Diagnosis not present

## 2022-11-12 DIAGNOSIS — M546 Pain in thoracic spine: Secondary | ICD-10-CM | POA: Diagnosis not present

## 2022-11-12 DIAGNOSIS — M9902 Segmental and somatic dysfunction of thoracic region: Secondary | ICD-10-CM | POA: Diagnosis not present

## 2022-11-12 DIAGNOSIS — M9903 Segmental and somatic dysfunction of lumbar region: Secondary | ICD-10-CM | POA: Diagnosis not present

## 2022-11-12 DIAGNOSIS — M9901 Segmental and somatic dysfunction of cervical region: Secondary | ICD-10-CM | POA: Diagnosis not present

## 2022-11-12 DIAGNOSIS — M5137 Other intervertebral disc degeneration, lumbosacral region: Secondary | ICD-10-CM | POA: Diagnosis not present

## 2022-11-12 DIAGNOSIS — M531 Cervicobrachial syndrome: Secondary | ICD-10-CM | POA: Diagnosis not present

## 2022-11-20 DIAGNOSIS — Z859 Personal history of malignant neoplasm, unspecified: Secondary | ICD-10-CM | POA: Diagnosis not present

## 2022-11-20 DIAGNOSIS — Z872 Personal history of diseases of the skin and subcutaneous tissue: Secondary | ICD-10-CM | POA: Diagnosis not present

## 2022-11-20 DIAGNOSIS — D492 Neoplasm of unspecified behavior of bone, soft tissue, and skin: Secondary | ICD-10-CM | POA: Diagnosis not present

## 2022-11-20 DIAGNOSIS — L578 Other skin changes due to chronic exposure to nonionizing radiation: Secondary | ICD-10-CM | POA: Diagnosis not present

## 2022-12-17 DIAGNOSIS — M9902 Segmental and somatic dysfunction of thoracic region: Secondary | ICD-10-CM | POA: Diagnosis not present

## 2022-12-17 DIAGNOSIS — M546 Pain in thoracic spine: Secondary | ICD-10-CM | POA: Diagnosis not present

## 2022-12-17 DIAGNOSIS — M9903 Segmental and somatic dysfunction of lumbar region: Secondary | ICD-10-CM | POA: Diagnosis not present

## 2022-12-17 DIAGNOSIS — M5137 Other intervertebral disc degeneration, lumbosacral region: Secondary | ICD-10-CM | POA: Diagnosis not present

## 2022-12-17 DIAGNOSIS — M9901 Segmental and somatic dysfunction of cervical region: Secondary | ICD-10-CM | POA: Diagnosis not present

## 2022-12-17 DIAGNOSIS — M531 Cervicobrachial syndrome: Secondary | ICD-10-CM | POA: Diagnosis not present

## 2023-01-20 DIAGNOSIS — M5137 Other intervertebral disc degeneration, lumbosacral region: Secondary | ICD-10-CM | POA: Diagnosis not present

## 2023-01-20 DIAGNOSIS — M546 Pain in thoracic spine: Secondary | ICD-10-CM | POA: Diagnosis not present

## 2023-01-20 DIAGNOSIS — M531 Cervicobrachial syndrome: Secondary | ICD-10-CM | POA: Diagnosis not present

## 2023-01-20 DIAGNOSIS — M9902 Segmental and somatic dysfunction of thoracic region: Secondary | ICD-10-CM | POA: Diagnosis not present

## 2023-01-20 DIAGNOSIS — M9901 Segmental and somatic dysfunction of cervical region: Secondary | ICD-10-CM | POA: Diagnosis not present

## 2023-01-20 DIAGNOSIS — M9903 Segmental and somatic dysfunction of lumbar region: Secondary | ICD-10-CM | POA: Diagnosis not present

## 2023-01-26 DIAGNOSIS — M9901 Segmental and somatic dysfunction of cervical region: Secondary | ICD-10-CM | POA: Diagnosis not present

## 2023-01-26 DIAGNOSIS — M546 Pain in thoracic spine: Secondary | ICD-10-CM | POA: Diagnosis not present

## 2023-01-26 DIAGNOSIS — M9902 Segmental and somatic dysfunction of thoracic region: Secondary | ICD-10-CM | POA: Diagnosis not present

## 2023-01-26 DIAGNOSIS — M9903 Segmental and somatic dysfunction of lumbar region: Secondary | ICD-10-CM | POA: Diagnosis not present

## 2023-01-26 DIAGNOSIS — M531 Cervicobrachial syndrome: Secondary | ICD-10-CM | POA: Diagnosis not present

## 2023-01-26 DIAGNOSIS — M5137 Other intervertebral disc degeneration, lumbosacral region: Secondary | ICD-10-CM | POA: Diagnosis not present

## 2023-02-18 DIAGNOSIS — M531 Cervicobrachial syndrome: Secondary | ICD-10-CM | POA: Diagnosis not present

## 2023-02-18 DIAGNOSIS — M9903 Segmental and somatic dysfunction of lumbar region: Secondary | ICD-10-CM | POA: Diagnosis not present

## 2023-02-18 DIAGNOSIS — M9901 Segmental and somatic dysfunction of cervical region: Secondary | ICD-10-CM | POA: Diagnosis not present

## 2023-02-18 DIAGNOSIS — M5137 Other intervertebral disc degeneration, lumbosacral region with discogenic back pain only: Secondary | ICD-10-CM | POA: Diagnosis not present

## 2023-02-18 DIAGNOSIS — M9902 Segmental and somatic dysfunction of thoracic region: Secondary | ICD-10-CM | POA: Diagnosis not present

## 2023-02-18 DIAGNOSIS — M546 Pain in thoracic spine: Secondary | ICD-10-CM | POA: Diagnosis not present

## 2023-03-24 DIAGNOSIS — M9901 Segmental and somatic dysfunction of cervical region: Secondary | ICD-10-CM | POA: Diagnosis not present

## 2023-03-24 DIAGNOSIS — M5137 Other intervertebral disc degeneration, lumbosacral region with discogenic back pain only: Secondary | ICD-10-CM | POA: Diagnosis not present

## 2023-03-24 DIAGNOSIS — M531 Cervicobrachial syndrome: Secondary | ICD-10-CM | POA: Diagnosis not present

## 2023-03-24 DIAGNOSIS — M546 Pain in thoracic spine: Secondary | ICD-10-CM | POA: Diagnosis not present

## 2023-03-24 DIAGNOSIS — M9902 Segmental and somatic dysfunction of thoracic region: Secondary | ICD-10-CM | POA: Diagnosis not present

## 2023-03-24 DIAGNOSIS — M9903 Segmental and somatic dysfunction of lumbar region: Secondary | ICD-10-CM | POA: Diagnosis not present

## 2023-04-14 DIAGNOSIS — M9901 Segmental and somatic dysfunction of cervical region: Secondary | ICD-10-CM | POA: Diagnosis not present

## 2023-04-14 DIAGNOSIS — M531 Cervicobrachial syndrome: Secondary | ICD-10-CM | POA: Diagnosis not present

## 2023-04-14 DIAGNOSIS — M5137 Other intervertebral disc degeneration, lumbosacral region with discogenic back pain only: Secondary | ICD-10-CM | POA: Diagnosis not present

## 2023-04-14 DIAGNOSIS — M546 Pain in thoracic spine: Secondary | ICD-10-CM | POA: Diagnosis not present

## 2023-04-14 DIAGNOSIS — M9902 Segmental and somatic dysfunction of thoracic region: Secondary | ICD-10-CM | POA: Diagnosis not present

## 2023-04-14 DIAGNOSIS — M9903 Segmental and somatic dysfunction of lumbar region: Secondary | ICD-10-CM | POA: Diagnosis not present

## 2023-04-16 DIAGNOSIS — L2089 Other atopic dermatitis: Secondary | ICD-10-CM | POA: Diagnosis not present

## 2023-04-17 ENCOUNTER — Other Ambulatory Visit: Payer: Self-pay | Admitting: Family Medicine

## 2023-04-17 DIAGNOSIS — N401 Enlarged prostate with lower urinary tract symptoms: Secondary | ICD-10-CM

## 2023-04-17 NOTE — Telephone Encounter (Signed)
Medication Refill -  Most Recent Primary Care Visit:  Provider: Alba Cory  Department: [facility]  Visit Type: PHYSICAL  Date: 07/29/2022  Medication: tamsulosin (FLOMAX) 0.4 MG CAPS capsule [[id number]]   Has the patient contacted their pharmacy? Yes (Agent: If no, request that the patient contact the pharmacy for the refill. If patient does not wish to contact the pharmacy document the reason why and proceed with request.) (Agent: If yes, when and what did the pharmacy advise?)  Is this the correct pharmacy for this prescription? Yes If no, delete pharmacy and type the correct one.  This is the patient's preferred pharmacy:  [phone] Dan Humphreys, [place] - [address] [place] [address] Phone: [phone] Fax: [phone]   Has the prescription been filled recently? Yes  Is the patient out of the medication? Yes  Has the patient been seen for an appointment in the last year OR does the patient have an upcoming appointment? Yes  Can we respond through MyChart? Yes  Agent: Please be advised that Rx refills may take up to 3 business days. We ask that you follow-up with your pharmacy.

## 2023-04-20 MED ORDER — TAMSULOSIN HCL 0.4 MG PO CAPS: 0.4000 mg | ORAL_CAPSULE | Freq: Every day | ORAL | 3 refills | Status: DC

## 2023-04-20 NOTE — Telephone Encounter (Signed)
Requested medication (s) are due for refill today: yes  Requested medication (s) are on the active medication list: yes  Last refill:  04/07/22 #90 3 RF  Future visit scheduled: yes  Notes to clinic:  overdue lab work   Requested Prescriptions  Pending Prescriptions Disp Refills   tamsulosin (FLOMAX) 0.4 MG CAPS capsule 90 capsule 3    Sig: Take 1 capsule (0.4 mg total) by mouth daily.     Urology: Alpha-Adrenergic Blocker Failed - 04/20/2023 11:04 AM      Failed - PSA in normal range and within 360 days    PSA  Date Value Ref Range Status  02/26/2021 1.5  Final   Prostate Specific Ag, Serum  Date Value Ref Range Status  08/31/2015 4.6 (H) 0.0 - 4.0 ng/mL Final    Comment:    Roche ECLIA methodology. According to the American Urological Association, Serum PSA should decrease and remain at undetectable levels after radical prostatectomy. The AUA defines biochemical recurrence as an initial PSA value 0.2 ng/mL or greater followed by a subsequent confirmatory PSA value 0.2 ng/mL or greater. Values obtained with different assay methods or kits cannot be used interchangeably. Results cannot be interpreted as absolute evidence of the presence or absence of malignant disease.          Passed - Last BP in normal range    BP Readings from Last 1 Encounters:  07/29/22 120/74         Passed - Valid encounter within last 12 months    Recent Outpatient Visits           8 months ago Well adult exam   [facility] Alba Cory, MD   12 months ago Viral upper respiratory tract infection   [facility] Berniece Salines, FNP   1 year ago Atherosclerosis of aorta [facility])   [facility] Alba Cory, MD   1 year ago Well adult exam   [facility] Alba Cory, MD   2 years ago Atherosclerosis of aorta [facility])   [facility] Alba Cory, MD        Future Appointments             In 3 months Carlynn Purl, Danna Hefty, MD [facility], [facility]

## 2023-04-20 NOTE — Telephone Encounter (Signed)
Pt has not had a follow up appt this year only CPE

## 2023-04-24 ENCOUNTER — Ambulatory Visit: Payer: Self-pay | Admitting: *Deleted

## 2023-04-24 NOTE — Telephone Encounter (Signed)
Attempted to return his call.  Left a voicemail to call back. 

## 2023-04-24 NOTE — Telephone Encounter (Signed)
Message from [place] F sent at 04/24/2023 11:35 AM EST  Summary: Sinus Infection   Pt is calling in with symptoms that he believes is a sinus infection. Pt is experiencing coughing, tiredness, as well as mucus. Pt wants to know if he can get an antibiotic or a steroid to help with his symptoms          Call History  Contact Date/Time Type Contact Phone/Fax By  04/24/2023 11:34 AM EST Phone (Incoming) Chestine Spore, Steven Griffin" (Self) [phone] Judie Petit) Colon Flattery M

## 2023-04-24 NOTE — Telephone Encounter (Signed)
3rd attempt, Patient called, left VM to return the call to the office to speak to the NT.   Summary: Sinus Infection   Pt is calling in with symptoms that he believes is a sinus infection. Pt is experiencing coughing, tiredness, as well as mucus. Pt wants to know if he can get an antibiotic or a steroid to help with his symptoms

## 2023-04-24 NOTE — Telephone Encounter (Signed)
2nd attempt to return his call.  Left a voicemail to call back. 

## 2023-04-27 ENCOUNTER — Ambulatory Visit: Payer: Self-pay

## 2023-04-27 ENCOUNTER — Telehealth: Payer: Medicare HMO | Admitting: Physician Assistant

## 2023-04-27 ENCOUNTER — Encounter: Payer: Self-pay | Admitting: Family Medicine

## 2023-04-27 DIAGNOSIS — B9689 Other specified bacterial agents as the cause of diseases classified elsewhere: Secondary | ICD-10-CM

## 2023-04-27 DIAGNOSIS — J019 Acute sinusitis, unspecified: Secondary | ICD-10-CM | POA: Diagnosis not present

## 2023-04-27 MED ORDER — AMOXICILLIN-POT CLAVULANATE 875-125 MG PO TABS: 1.0000 | ORAL_TABLET | Freq: Two times a day (BID) | ORAL | 0 refills | Status: DC

## 2023-04-27 NOTE — Telephone Encounter (Signed)
Lvm for pt to call back and schedule an appt 

## 2023-04-27 NOTE — Patient Instructions (Signed)
Steven Griffin, thank you for joining Margaretann Loveless, PA-C for today's virtual visit.  While this provider is not your primary care provider (PCP), if your PCP is located in our provider database this encounter information will be shared with them immediately following your visit.   A [facility] MyChart account gives you access to today's visit and all your visits, tests, and labs performed at [facility] " click here if you don't have a [facility] MyChart account or go to [email or web address]  Consent: (Patient) Steven Griffin provided verbal consent for this virtual visit at the beginning of the encounter.  Current Medications:  Current Outpatient Medications:    amoxicillin-clavulanate (AUGMENTIN) 875-125 MG tablet, Take 1 tablet by mouth 2 (two) times daily., Disp: 14 tablet, Rfl: 0   5-Hydroxytryptophan (5-HTP) 100 MG CAPS, Take 100 mg by mouth at bedtime. , Disp: , Rfl:    aspirin EC 81 MG tablet, Take 81 mg by mouth daily., Disp: , Rfl:    chlorpheniramine-HYDROcodone (TUSSIONEX) 10-8 MG/5ML, Take 5 mLs by mouth every 12 (twelve) hours as needed for cough., Disp: 115 mL, Rfl: 0   Cholecalciferol (VITAMIN D3 PO), Take 1 capsule by mouth daily., Disp: , Rfl:    Coenzyme Q10 (CO Q 10) 100 MG CAPS, Take 100 mg by mouth daily., Disp: , Rfl:    diltiazem (CARDIZEM) 30 MG tablet, Take 1 tablet (30 mg total) by mouth every 6 (six) hours as needed (palpitations)., Disp: 30 tablet, Rfl: 1   diphenhydrAMINE (BENADRYL) 25 MG tablet, Take 25 mg by mouth at bedtime as needed., Disp: , Rfl:    finasteride (PROSCAR) 5 MG tablet, Take 1 tablet (5 mg total) by mouth daily., Disp: 90 tablet, Rfl: 3   Flaxseed, Linseed, (FLAXSEED OIL PO), Take 1 each by mouth See admin instructions. Take 15 ml by mouth daily, Disp: , Rfl:    Green Tea, Camellia sinensis, (GREEN TEA EXTRACT PO), Take by mouth., Disp: , Rfl:    Homeopathic Products (LEG CRAMP RELIEF PO), Take 2 tablets by mouth at  bedtime., Disp: , Rfl:    L-Theanine 100 MG CAPS, Take 100 mg by mouth 2 (two) times daily., Disp: , Rfl:    LITHIUM PO, Take 1,000 mcg by mouth. From life extension foundation, Disp: , Rfl:    MAGNESIUM PO, Take 300 mg by mouth at bedtime. , Disp: , Rfl:    Melatonin 3 MG CAPS, Take 3 mg by mouth at bedtime. , Disp: , Rfl:    Misc Natural Products (BETA-SITOSTEROL PLANT STEROLS) CAPS, Take 1 capsule by mouth daily., Disp: , Rfl:    Misc Natural Products (GLUCOSAMINE CHOND MSM FORMULA PO), Take 2 capsules by mouth 2 (two) times daily., Disp: , Rfl:    Multiple Vitamin (MULTIVITAMIN) tablet, Take 1 tablet by mouth daily., Disp: , Rfl:    Omega-3 Fatty Acids (FISH OIL) 1000 MG CAPS, Take 1,000 mg by mouth daily. , Disp: , Rfl:    Polyvinyl Alcohol-Povidone (REFRESH OP), Place 2 drops into both eyes as needed (for dry eyes)., Disp: , Rfl:    Probiotic Product (PROBIOTIC DAILY PO), Take 1 capsule by mouth daily. , Disp: , Rfl:    tamsulosin (FLOMAX) 0.4 MG CAPS capsule, Take 1 capsule (0.4 mg total) by mouth daily., Disp: 90 capsule, Rfl: 3   Turmeric 500 MG CAPS, Take by mouth., Disp: , Rfl:    zinc gluconate 50 MG tablet, Take 50 mg by mouth daily., Disp: , Rfl:  Medications ordered in this encounter:  Meds ordered this encounter  Medications   amoxicillin-clavulanate (AUGMENTIN) 875-125 MG tablet    Sig: Take 1 tablet by mouth 2 (two) times daily.    Dispense:  14 tablet    Refill:  0    Supervising Provider:   Merrilee Jansky [[id number]]     *If you need refills on other medications prior to your next appointment, please contact your pharmacy*  Follow-Up: Call back or seek an in-person evaluation if the symptoms worsen or if the condition fails to improve as anticipated.  [facility] [phone]  Other Instructions Sinus Infection, Adult A sinus infection, also called sinusitis, is inflammation of your sinuses. Sinuses are hollow spaces in the bones around your  face. Your sinuses are located: Around your eyes. In the middle of your forehead. Behind your nose. In your cheekbones. Mucus normally drains out of your sinuses. When your nasal tissues become inflamed or swollen, mucus can become trapped or blocked. This allows bacteria, viruses, and fungi to grow, which leads to infection. Most infections of the sinuses are caused by a virus. A sinus infection can develop quickly. It can last for up to 4 weeks (acute) or for more than 12 weeks (chronic). A sinus infection often develops after a cold. What are the causes? This condition is caused by anything that creates swelling in the sinuses or stops mucus from draining. This includes: Allergies. Asthma. Infection from bacteria or viruses. Deformities or blockages in your nose or sinuses. Abnormal growths in the nose (nasal polyps). Pollutants, such as chemicals or irritants in the air. Infection from fungi. This is rare. What increases the risk? You are more likely to develop this condition if you: Have a weak body defense system (immune system). Do a lot of swimming or diving. Overuse nasal sprays. Smoke. What are the signs or symptoms? The main symptoms of this condition are pain and a feeling of pressure around the affected sinuses. Other symptoms include: Stuffy nose or congestion that makes it difficult to breathe through your nose. Thick yellow or greenish drainage from your nose. Tenderness, swelling, and warmth over the affected sinuses. A cough that may get worse at night. Decreased sense of smell and taste. Extra mucus that collects in the throat or the back of the nose (postnasal drip) causing a sore throat or bad breath. Tiredness (fatigue). Fever. How is this diagnosed? This condition is diagnosed based on: Your symptoms. Your medical history. A physical exam. Tests to find out if your condition is acute or chronic. This may include: Checking your nose for nasal  polyps. Viewing your sinuses using a device that has a light (endoscope). Testing for allergies or bacteria. Imaging tests, such as an MRI or CT scan. In rare cases, a bone biopsy may be done to rule out more serious types of fungal sinus disease. How is this treated? Treatment for a sinus infection depends on the cause and whether your condition is chronic or acute. If caused by a virus, your symptoms should go away on their own within 10 days. You may be given medicines to relieve symptoms. They include: Medicines that shrink swollen nasal passages (decongestants). A spray that eases inflammation of the nostrils (topical intranasal corticosteroids). Rinses that help get rid of thick mucus in your nose (nasal saline washes). Medicines that treat allergies (antihistamines). Over-the-counter pain relievers. If caused by bacteria, your health care provider may recommend waiting to see if your symptoms improve. Most  bacterial infections will get better without antibiotic medicine. You may be given antibiotics if you have: A severe infection. A weak immune system. If caused by narrow nasal passages or nasal polyps, surgery may be needed. Follow these instructions at home: Medicines Take, use, or apply over-the-counter and prescription medicines only as told by your health care provider. These may include nasal sprays. If you were prescribed an antibiotic medicine, take it as told by your health care provider. Do not stop taking the antibiotic even if you start to feel better. Hydrate and humidify  Drink enough fluid to keep your urine pale yellow. Staying hydrated will help to thin your mucus. Use a cool mist humidifier to keep the humidity level in your home above 50%. Inhale steam for 10-15 minutes, 3-4 times a day, or as told by your health care provider. You can do this in the bathroom while a hot shower is running. Limit your exposure to cool or dry air. Rest Rest as much as  possible. Sleep with your head raised (elevated). Make sure you get enough sleep each night. General instructions  Apply a warm, moist washcloth to your face 3-4 times a day or as told by your health care provider. This will help with discomfort. Use nasal saline washes as often as told by your health care provider. Wash your hands often with soap and water to reduce your exposure to germs. If soap and water are not available, use hand sanitizer. Do not smoke. Avoid being around people who are smoking (secondhand smoke). Keep all follow-up visits. This is important. Contact a health care provider if: You have a fever. Your symptoms get worse. Your symptoms do not improve within 10 days. Get help right away if: You have a severe headache. You have persistent vomiting. You have severe pain or swelling around your face or eyes. You have vision problems. You develop confusion. Your neck is stiff. You have trouble breathing. These symptoms may be an emergency. Get help right away. Call 911. Do not wait to see if the symptoms will go away. Do not drive yourself to the hospital. Summary A sinus infection is soreness and inflammation of your sinuses. Sinuses are hollow spaces in the bones around your face. This condition is caused by nasal tissues that become inflamed or swollen. The swelling traps or blocks the flow of mucus. This allows bacteria, viruses, and fungi to grow, which leads to infection. If you were prescribed an antibiotic medicine, take it as told by your health care provider. Do not stop taking the antibiotic even if you start to feel better. Keep all follow-up visits. This is important. This information is not intended to replace advice given to you by your health care provider. Make sure you discuss any questions you have with your health care provider. Document Revised: 03/19/2021 Document Reviewed: 03/19/2021 Elsevier Patient Education  2024 Elsevier Inc.    If you  have been instructed to have an in-person evaluation today at a local Urgent Care facility, please use the link below. It will take you to a list of all of our available [facility], including address, phone number and hours of operation. Please do not delay care.  [facility]  If you or a family member do not have a primary care provider, use the link below to schedule a visit and establish care. When you choose a [facility] primary care physician or advanced practice provider, you gain a long-term partner in health.  Find a Primary Care Provider  Learn more about [facility]'s in-office and virtual care options: [facility] - Get Care Now

## 2023-04-27 NOTE — Telephone Encounter (Signed)
Message from [place] B sent at 04/27/2023  3:26 PM EST  Summary: bad cold/congestion   Pt called in with bad cold,with congestion says gets worse st night where he can't sleep, wants sooner appt then Jan 2.        Called pt and LM on VM to call back.

## 2023-04-27 NOTE — Telephone Encounter (Signed)
  Chief Complaint: URI Symptoms: cough worse at night, congestion in morning, sore throat  Frequency: 6 days  Pertinent Negatives: Patient denies SOB  Disposition: [] ED /[] Urgent Care (no appt availability in office) / [] Appointment(In office/virtual)/ [x]  [facility]/ [] Home Care/ [] Refused Recommended Disposition /[]  Mobile Bus/ []  Follow-up with PCP Additional Notes: scheduled VUC for today at 1815. Care advice given and pt verbalized understanding.   Reason for Disposition . [1] SEVERE sore throat AND [2] present > 24 hours  Answer Assessment - Initial Assessment Questions 1. ONSET: "When did the nasal discharge start?"      6 days  3. COUGH: "Do you have a cough?" If Yes, ask: "Describe the color of your sputum" (clear, white, yellow, green)     Yes worse at night  4. RESPIRATORY DISTRESS: "Describe your breathing."      no 5. FEVER: "Do you have a fever?" If Yes, ask: "What is your temperature, how was it measured, and when did it start?"     no 7. OTHER SYMPTOMS: "Do you have any other symptoms?" (e.g., sore throat, earache, wheezing, vomiting)     Sore throat, congestion in morning  Protocols used: Common Cold-A-AH

## 2023-04-27 NOTE — Progress Notes (Signed)
Virtual Visit Consent   Steven Griffin, you are scheduled for a virtual visit with a [place] provider today. Just as with appointments in the office, your consent must be obtained to participate. Your consent will be active for this visit and any virtual visit you may have with one of our providers in the next 365 days. If you have a MyChart account, a copy of this consent can be sent to you electronically.  As this is a virtual visit, video technology does not allow for your provider to perform a traditional examination. This may limit your provider's ability to fully assess your condition. If your provider identifies any concerns that need to be evaluated in person or the need to arrange testing (such as labs, EKG, etc.), we will make arrangements to do so. Although advances in technology are sophisticated, we cannot ensure that it will always work on either your end or our end. If the connection with a video visit is poor, the visit may have to be switched to a telephone visit. With either a video or telephone visit, we are not always able to ensure that we have a secure connection.  By engaging in this virtual visit, you consent to the provision of healthcare and authorize for your insurance to be billed (if applicable) for the services provided during this visit. Depending on your insurance coverage, you may receive a charge related to this service.  I need to obtain your verbal consent now. Are you willing to proceed with your visit today? DOANLD PONCE has provided verbal consent on 04/27/2023 for a virtual visit (video or telephone). Margaretann Loveless, PA-C  Date: 04/27/2023 6:49 PM  Virtual Visit via Video Note   I, Margaretann Loveless, connected with  Steven Griffin  ([id number], 1945/08/13) on 04/27/23 at  6:15 PM EST by a video-enabled telemedicine application and verified that I am speaking with the correct person using two identifiers.  Location: Patient: Virtual Visit Location  Patient: Home Provider: Virtual Visit Location Provider: Home Office   I discussed the limitations of evaluation and management by telemedicine and the availability of in person appointments. The patient expressed understanding and agreed to proceed.    History of Present Illness: Steven Griffin is a 77 y.o. who identifies as a male who was assigned male at birth, and is being seen today for sinus congestion overnight and cough.  HPI: URI  This is a new problem. The current episode started in the past 7 days. The problem has been gradually worsening. There has been no fever. Associated symptoms include congestion, coughing (worst at night with lying down, mildly productive; cough more productive in the morning), headaches, rhinorrhea, sinus pain and a sore throat (dry throat and mouth). Pertinent negatives include no chest pain, diarrhea, ear pain, nausea, neck pain, plugged ear sensation, vomiting or wheezing. Associated symptoms comments: Decreased appetite. He has tried antihistamine, acetaminophen and increased fluids (cough, saline nasal rinses) for the symptoms. The treatment provided mild relief.     Problems:  Patient Active Problem List   Diagnosis Date Noted   Snoring 09/05/2021   Pulmonary hypertension, unspecified (HCC) 09/05/2021   DDD (degenerative disc disease), lumbosacral 02/26/2021   Benign prostatic hyperplasia with urinary obstruction 02/26/2021   Centrilobular emphysema (HCC) 02/26/2021   Coronary artery disease involving native coronary artery of native heart without angina pectoris 12/13/2019   Atherosclerosis of aorta (HCC) 12/13/2019   Senile purpura (HCC) 12/13/2019   Personal history of tobacco  use, presenting hazards to health 11/25/2018   Essential hypertension, benign 02/23/2018   Superficial thrombophlebitis 02/23/2018   Phlebitis of left lower extremity 01/26/2018   Paroxysmal atrial fibrillation (HCC) 03/03/2017   Bradycardia 08/31/2015   Muscle tear  04/20/2015   Allergic rhinitis, seasonal 04/20/2015   Chronic venous insufficiency 10/27/2014   Intermittent tremor 10/27/2014   ED (erectile dysfunction) of non-organic origin 10/27/2014   Colon, diverticulosis 10/27/2014   Benign prostatic hyperplasia without urinary obstruction 10/27/2014   Bundle branch block, right and left anterior fascicular 06/07/2013   Atrial fibrillation (HCC) 10/20/2012   Obstructive apnea 03/23/2012   Polypharmacy 04/15/2011   On dofetilide therapy 04/15/2011   Hypercholesteremia 01/18/2011   H/O squamous cell carcinoma of skin 01/18/2011   History of major vascular surgery 01/18/2011    Allergies: No Known Allergies Medications:  Current Outpatient Medications:    amoxicillin-clavulanate (AUGMENTIN) 875-125 MG tablet, Take 1 tablet by mouth 2 (two) times daily., Disp: 14 tablet, Rfl: 0   5-Hydroxytryptophan (5-HTP) 100 MG CAPS, Take 100 mg by mouth at bedtime. , Disp: , Rfl:    aspirin EC 81 MG tablet, Take 81 mg by mouth daily., Disp: , Rfl:    chlorpheniramine-HYDROcodone (TUSSIONEX) 10-8 MG/5ML, Take 5 mLs by mouth every 12 (twelve) hours as needed for cough., Disp: 115 mL, Rfl: 0   Cholecalciferol (VITAMIN D3 PO), Take 1 capsule by mouth daily., Disp: , Rfl:    Coenzyme Q10 (CO Q 10) 100 MG CAPS, Take 100 mg by mouth daily., Disp: , Rfl:    diltiazem (CARDIZEM) 30 MG tablet, Take 1 tablet (30 mg total) by mouth every 6 (six) hours as needed (palpitations)., Disp: 30 tablet, Rfl: 1   diphenhydrAMINE (BENADRYL) 25 MG tablet, Take 25 mg by mouth at bedtime as needed., Disp: , Rfl:    finasteride (PROSCAR) 5 MG tablet, Take 1 tablet (5 mg total) by mouth daily., Disp: 90 tablet, Rfl: 3   Flaxseed, Linseed, (FLAXSEED OIL PO), Take 1 each by mouth See admin instructions. Take 15 ml by mouth daily, Disp: , Rfl:    Green Tea, Camellia sinensis, (GREEN TEA EXTRACT PO), Take by mouth., Disp: , Rfl:    Homeopathic Products (LEG CRAMP RELIEF PO), Take 2 tablets by  mouth at bedtime., Disp: , Rfl:    L-Theanine 100 MG CAPS, Take 100 mg by mouth 2 (two) times daily., Disp: , Rfl:    LITHIUM PO, Take 1,000 mcg by mouth. From life extension foundation, Disp: , Rfl:    MAGNESIUM PO, Take 300 mg by mouth at bedtime. , Disp: , Rfl:    Melatonin 3 MG CAPS, Take 3 mg by mouth at bedtime. , Disp: , Rfl:    Misc Natural Products (BETA-SITOSTEROL PLANT STEROLS) CAPS, Take 1 capsule by mouth daily., Disp: , Rfl:    Misc Natural Products (GLUCOSAMINE CHOND MSM FORMULA PO), Take 2 capsules by mouth 2 (two) times daily., Disp: , Rfl:    Multiple Vitamin (MULTIVITAMIN) tablet, Take 1 tablet by mouth daily., Disp: , Rfl:    Omega-3 Fatty Acids (FISH OIL) 1000 MG CAPS, Take 1,000 mg by mouth daily. , Disp: , Rfl:    Polyvinyl Alcohol-Povidone (REFRESH OP), Place 2 drops into both eyes as needed (for dry eyes)., Disp: , Rfl:    Probiotic Product (PROBIOTIC DAILY PO), Take 1 capsule by mouth daily. , Disp: , Rfl:    tamsulosin (FLOMAX) 0.4 MG CAPS capsule, Take 1 capsule (0.4 mg total) by mouth daily.,  Disp: 90 capsule, Rfl: 3   Turmeric 500 MG CAPS, Take by mouth., Disp: , Rfl:    zinc gluconate 50 MG tablet, Take 50 mg by mouth daily., Disp: , Rfl:   Observations/Objective: Patient is well-developed, well-nourished in no acute distress.  Resting comfortably at home.  Head is normocephalic, atraumatic.  No labored breathing.  Speech is clear and coherent with logical content.  Patient is alert and oriented at baseline.    Assessment and Plan: 1. Acute bacterial sinusitis (Primary) - amoxicillin-clavulanate (AUGMENTIN) 875-125 MG tablet; Take 1 tablet by mouth 2 (two) times daily.  Dispense: 14 tablet; Refill: 0  - Worsening symptoms that have not responded to OTC medications.  - Will give Augmentin - Continue allergy medications.  - Steam and humidifier can help - Stay well hydrated and get plenty of rest.  - Message sent to PCP asking for Tussionex cough  medication for patient - Seek in person evaluation if no symptom improvement or if symptoms worsen   Follow Up Instructions: I discussed the assessment and treatment plan with the patient. The patient was provided an opportunity to ask questions and all were answered. The patient agreed with the plan and demonstrated an understanding of the instructions.  A copy of instructions were sent to the patient via MyChart unless otherwise noted below.    The patient was advised to call back or seek an in-person evaluation if the symptoms worsen or if the condition fails to improve as anticipated.    Margaretann Loveless, PA-C

## 2023-04-28 ENCOUNTER — Encounter: Payer: Self-pay | Admitting: Family Medicine

## 2023-04-28 ENCOUNTER — Other Ambulatory Visit: Payer: Self-pay | Admitting: Family Medicine

## 2023-04-28 DIAGNOSIS — J069 Acute upper respiratory infection, unspecified: Secondary | ICD-10-CM

## 2023-04-28 MED ORDER — HYDROCOD POLI-CHLORPHE POLI ER 10-8 MG/5ML PO SUER: 5.0000 mL | Freq: Two times a day (BID) | ORAL | 0 refills | Status: DC | PRN

## 2023-06-16 DIAGNOSIS — M5441 Lumbago with sciatica, right side: Secondary | ICD-10-CM | POA: Diagnosis not present

## 2023-06-16 DIAGNOSIS — M955 Acquired deformity of pelvis: Secondary | ICD-10-CM | POA: Diagnosis not present

## 2023-06-16 DIAGNOSIS — M531 Cervicobrachial syndrome: Secondary | ICD-10-CM | POA: Diagnosis not present

## 2023-06-16 DIAGNOSIS — M9905 Segmental and somatic dysfunction of pelvic region: Secondary | ICD-10-CM | POA: Diagnosis not present

## 2023-06-16 DIAGNOSIS — M9903 Segmental and somatic dysfunction of lumbar region: Secondary | ICD-10-CM | POA: Diagnosis not present

## 2023-06-16 DIAGNOSIS — M9901 Segmental and somatic dysfunction of cervical region: Secondary | ICD-10-CM | POA: Diagnosis not present

## 2023-06-18 ENCOUNTER — Encounter: Payer: Self-pay | Admitting: Nurse Practitioner

## 2023-06-18 ENCOUNTER — Encounter: Payer: Self-pay | Admitting: Family Medicine

## 2023-06-18 ENCOUNTER — Telehealth: Payer: Medicare HMO | Admitting: Nurse Practitioner

## 2023-06-18 DIAGNOSIS — R051 Acute cough: Secondary | ICD-10-CM | POA: Diagnosis not present

## 2023-06-18 DIAGNOSIS — J069 Acute upper respiratory infection, unspecified: Secondary | ICD-10-CM | POA: Diagnosis not present

## 2023-06-18 MED ORDER — HYDROCOD POLI-CHLORPHE POLI ER 10-8 MG/5ML PO SUER: 5.0000 mL | Freq: Two times a day (BID) | ORAL | 0 refills | Status: DC | PRN

## 2023-06-18 NOTE — Progress Notes (Signed)
Name: Steven Griffin   MRN: [id number]    DOB: 11-24-1945   Date:06/18/2023       Progress Note  Subjective  Chief Complaint  Chief Complaint  Patient presents with   URI    Been sick off and on since dec.  Started antibiotic given in dec. On day 2, cough worse at night wants refill on cough syrup    I connected with  Yetta Flock  on 06/18/23 at  1:40 PM EST by a video enabled telemedicine application and verified that I am speaking with the correct person using two identifiers.  I discussed the limitations of evaluation and management by telemedicine and the availability of in person appointments. The patient expressed understanding and agreed to proceed with a virtual visit  Staff also discussed with the patient that there may be a patient responsible charge related to this service. Patient Location: home Provider Location: [facility] Additional Individuals present: alone  HPI  Discussed the use of AI scribe software for clinical note transcription with the patient, who gave verbal consent to proceed.  History of Present Illness   The patient, with a history of sinusitis, presents with a recurrence of symptoms similar to those experienced in December. He describes a sensation of sinus pressure and a cough that worsens at night. Despite these symptoms, he reports being able to function and work, albeit with some fatigue. He has been managing his symptoms with sinus rinses and a neti pot. He recently started a course of Augmentin, which was prescribed in December but not initially taken. He has been using Tussinex to manage his cough. The patient is planning to travel to [place] in the coming week.    Patient Active Problem List   Diagnosis Date Noted   Snoring 09/05/2021   Pulmonary hypertension, unspecified (HCC) 09/05/2021   DDD (degenerative disc disease), lumbosacral 02/26/2021   Benign prostatic hyperplasia with urinary obstruction 02/26/2021   Centrilobular emphysema (HCC)  02/26/2021   Coronary artery disease involving native coronary artery of native heart without angina pectoris 12/13/2019   Atherosclerosis of aorta (HCC) 12/13/2019   Senile purpura (HCC) 12/13/2019   Personal history of tobacco use, presenting hazards to health 11/25/2018   Essential hypertension, benign 02/23/2018   Superficial thrombophlebitis 02/23/2018   Phlebitis of left lower extremity 01/26/2018   Paroxysmal atrial fibrillation (HCC) 03/03/2017   Bradycardia 08/31/2015   Muscle tear 04/20/2015   Allergic rhinitis, seasonal 04/20/2015   Chronic venous insufficiency 10/27/2014   Intermittent tremor 10/27/2014   ED (erectile dysfunction) of non-organic origin 10/27/2014   Colon, diverticulosis 10/27/2014   Benign prostatic hyperplasia without urinary obstruction 10/27/2014   Bundle branch block, right and left anterior fascicular 06/07/2013   Atrial fibrillation (HCC) 10/20/2012   Obstructive apnea 03/23/2012   Polypharmacy 04/15/2011   On dofetilide therapy 04/15/2011   Hypercholesteremia 01/18/2011   H/O squamous cell carcinoma of skin 01/18/2011   History of major vascular surgery 01/18/2011    Social History   Tobacco Use   Smoking status: Every Day    Current packs/day: 1.00    Average packs/day: 1 pack/day for 35.0 years (35.0 ttl pk-yrs)    Types: Cigarettes, Cigars   Smokeless tobacco: Never   Tobacco comments:    patient uses natural tobacco (rolls his own)  Substance Use Topics   Alcohol use: Yes    Alcohol/week: 1.0 standard drink of alcohol    Types: 1 Glasses of wine per week    Comment: 2-3  glasses of wine at night     Current Outpatient Medications:    5-Hydroxytryptophan (5-HTP) 100 MG CAPS, Take 100 mg by mouth at bedtime. , Disp: , Rfl:    amoxicillin-clavulanate (AUGMENTIN) 875-125 MG tablet, Take 1 tablet by mouth 2 (two) times daily., Disp: 14 tablet, Rfl: 0   aspirin EC 81 MG tablet, Take 81 mg by mouth daily., Disp: , Rfl:     Cholecalciferol (VITAMIN D3 PO), Take 1 capsule by mouth daily., Disp: , Rfl:    Coenzyme Q10 (CO Q 10) 100 MG CAPS, Take 100 mg by mouth daily., Disp: , Rfl:    diltiazem (CARDIZEM) 30 MG tablet, Take 1 tablet (30 mg total) by mouth every 6 (six) hours as needed (palpitations)., Disp: 30 tablet, Rfl: 1   diphenhydrAMINE (BENADRYL) 25 MG tablet, Take 25 mg by mouth at bedtime as needed., Disp: , Rfl:    finasteride (PROSCAR) 5 MG tablet, Take 1 tablet (5 mg total) by mouth daily., Disp: 90 tablet, Rfl: 3   Flaxseed, Linseed, (FLAXSEED OIL PO), Take 1 each by mouth See admin instructions. Take 15 ml by mouth daily, Disp: , Rfl:    Green Tea, Camellia sinensis, (GREEN TEA EXTRACT PO), Take by mouth., Disp: , Rfl:    Homeopathic Products (LEG CRAMP RELIEF PO), Take 2 tablets by mouth at bedtime., Disp: , Rfl:    L-Theanine 100 MG CAPS, Take 100 mg by mouth 2 (two) times daily., Disp: , Rfl:    LITHIUM PO, Take 1,000 mcg by mouth. From life extension foundation, Disp: , Rfl:    MAGNESIUM PO, Take 300 mg by mouth at bedtime. , Disp: , Rfl:    Melatonin 3 MG CAPS, Take 3 mg by mouth at bedtime. , Disp: , Rfl:    Misc Natural Products (BETA-SITOSTEROL PLANT STEROLS) CAPS, Take 1 capsule by mouth daily., Disp: , Rfl:    Misc Natural Products (GLUCOSAMINE CHOND MSM FORMULA PO), Take 2 capsules by mouth 2 (two) times daily., Disp: , Rfl:    Multiple Vitamin (MULTIVITAMIN) tablet, Take 1 tablet by mouth daily., Disp: , Rfl:    Omega-3 Fatty Acids (FISH OIL) 1000 MG CAPS, Take 1,000 mg by mouth daily. , Disp: , Rfl:    Polyvinyl Alcohol-Povidone (REFRESH OP), Place 2 drops into both eyes as needed (for dry eyes)., Disp: , Rfl:    Probiotic Product (PROBIOTIC DAILY PO), Take 1 capsule by mouth daily. , Disp: , Rfl:    tamsulosin (FLOMAX) 0.4 MG CAPS capsule, Take 1 capsule (0.4 mg total) by mouth daily., Disp: 90 capsule, Rfl: 3   Turmeric 500 MG CAPS, Take by mouth., Disp: , Rfl:    zinc gluconate 50 MG  tablet, Take 50 mg by mouth daily., Disp: , Rfl:    chlorpheniramine-HYDROcodone (TUSSIONEX) 10-8 MG/5ML, Take 5 mLs by mouth every 12 (twelve) hours as needed for cough., Disp: 115 mL, Rfl: 0  No Known Allergies  I personally reviewed active problem list, medication list, allergies, notes from last encounter with the patient/caregiver today.  ROS  Ten systems reviewed and is negative except as mentioned in HPI   Objective  Virtual encounter, vitals not obtained.  There is no height or weight on file to calculate BMI.  Nursing Note and Vital Signs reviewed.  Physical Exam  Awake, alert and oriented, speaking in complete sentences   No results found for this or any previous visit (from the past 72 hours).  Assessment & Plan  Assessment and  Plan    Sinusitis Recurrent symptoms since December 2024. Initially did not take prescribed Augmentin, but has now started the course. Also using neti pot for sinus rinses. -Continue Augmentin as prescribed. -push fluids -can also take flonase, mucinex and zyrtec or claritin   Cough Intermittent since December 2024, worse at night. Tussionex provides some relief but is nearly depleted. -Prescribe T[facility]ex to [facility]  -push fluids -can also take flonase, mucinex and zyrtec or claritin  -If cough does not improve, patient to return for in person appointment         -Red flags and when to present for emergency care or RTC including fever >101.68F, chest pain, shortness of breath, new/worsening/un-resolving symptoms,  reviewed with patient at time of visit. Follow up and care instructions discussed and provided in AVS. - I discussed the assessment and treatment plan with the patient. The patient was provided an opportunity to ask questions and all were answered. The patient agreed with the plan and demonstrated an understanding of the instructions.  I provided 15 minutes of non-face-to-face time during this encounter.  Berniece Salines,  FNP

## 2023-07-10 ENCOUNTER — Other Ambulatory Visit: Payer: Self-pay | Admitting: Family Medicine

## 2023-07-10 DIAGNOSIS — N138 Other obstructive and reflux uropathy: Secondary | ICD-10-CM

## 2023-07-13 ENCOUNTER — Other Ambulatory Visit: Payer: Self-pay

## 2023-07-13 DIAGNOSIS — N401 Enlarged prostate with lower urinary tract symptoms: Secondary | ICD-10-CM

## 2023-07-13 MED ORDER — FINASTERIDE 5 MG PO TABS: 5.0000 mg | ORAL_TABLET | Freq: Every day | ORAL | 0 refills | Status: AC

## 2023-07-13 NOTE — Telephone Encounter (Signed)
 Copied from CRM [id number]. Topic: General - Other >> Jul 10, 2023  3:22 PM [place] B wrote: Reason for CRM: pt called in checking on med refill, that was denied because he needs an appt, pt was kind of upset about this,and dindt schedule an appt,  and said he will send Dr Carlynn Purl an email.    Advised patient due to him not having a regular follow up appointment(2024/) his Finasteride was denied. Pt has an upcoming CPE on 08/04/2023. Would like to know if you can do a video visit for a regular follow up. Pt also wanted me to mention to you he will be getting labs done prior to CPE appointment (did not mention where), he said he has done this in the past and you are aware. Will give 30 day courtesy for him to make a regular follow up appointment.

## 2023-07-21 DIAGNOSIS — M531 Cervicobrachial syndrome: Secondary | ICD-10-CM | POA: Diagnosis not present

## 2023-07-21 DIAGNOSIS — M955 Acquired deformity of pelvis: Secondary | ICD-10-CM | POA: Diagnosis not present

## 2023-07-21 DIAGNOSIS — M9901 Segmental and somatic dysfunction of cervical region: Secondary | ICD-10-CM | POA: Diagnosis not present

## 2023-07-21 DIAGNOSIS — M5441 Lumbago with sciatica, right side: Secondary | ICD-10-CM | POA: Diagnosis not present

## 2023-07-21 DIAGNOSIS — M9905 Segmental and somatic dysfunction of pelvic region: Secondary | ICD-10-CM | POA: Diagnosis not present

## 2023-07-21 DIAGNOSIS — M9903 Segmental and somatic dysfunction of lumbar region: Secondary | ICD-10-CM | POA: Diagnosis not present

## 2023-08-03 DIAGNOSIS — Z0120 Encounter for dental examination and cleaning without abnormal findings: Secondary | ICD-10-CM | POA: Diagnosis not present

## 2023-08-04 ENCOUNTER — Encounter: Payer: Self-pay | Admitting: Family Medicine

## 2023-08-04 ENCOUNTER — Ambulatory Visit (INDEPENDENT_AMBULATORY_CARE_PROVIDER_SITE_OTHER): Payer: Self-pay | Admitting: Family Medicine

## 2023-08-04 ENCOUNTER — Other Ambulatory Visit: Payer: Self-pay

## 2023-08-04 VITALS — BP 118/78 | HR 58 | Resp 16 | Ht 69.5 in | Wt 164.4 lb

## 2023-08-04 DIAGNOSIS — Z720 Tobacco use: Secondary | ICD-10-CM | POA: Diagnosis not present

## 2023-08-04 DIAGNOSIS — I272 Pulmonary hypertension, unspecified: Secondary | ICD-10-CM | POA: Diagnosis not present

## 2023-08-04 DIAGNOSIS — J432 Centrilobular emphysema: Secondary | ICD-10-CM | POA: Diagnosis not present

## 2023-08-04 DIAGNOSIS — I480 Paroxysmal atrial fibrillation: Secondary | ICD-10-CM

## 2023-08-04 DIAGNOSIS — I2510 Atherosclerotic heart disease of native coronary artery without angina pectoris: Secondary | ICD-10-CM

## 2023-08-04 DIAGNOSIS — I872 Venous insufficiency (chronic) (peripheral): Secondary | ICD-10-CM | POA: Diagnosis not present

## 2023-08-04 DIAGNOSIS — I700 Atherosclerosis of aorta: Secondary | ICD-10-CM | POA: Diagnosis not present

## 2023-08-04 DIAGNOSIS — Z23 Encounter for immunization: Secondary | ICD-10-CM

## 2023-08-04 DIAGNOSIS — Z0000 Encounter for general adult medical examination without abnormal findings: Secondary | ICD-10-CM

## 2023-08-04 DIAGNOSIS — F17210 Nicotine dependence, cigarettes, uncomplicated: Secondary | ICD-10-CM

## 2023-08-04 DIAGNOSIS — Z122 Encounter for screening for malignant neoplasm of respiratory organs: Secondary | ICD-10-CM

## 2023-08-04 DIAGNOSIS — Z87891 Personal history of nicotine dependence: Secondary | ICD-10-CM

## 2023-08-04 MED ORDER — DILTIAZEM HCL 30 MG PO TABS: 30.0000 mg | ORAL_TABLET | Freq: Four times a day (QID) | ORAL | 1 refills | Status: AC | PRN

## 2023-08-04 NOTE — Progress Notes (Signed)
 Name: Steven Griffin   MRN: [id number]    DOB: [birth year]   Date:08/04/2023       Progress Note  Subjective  Chief Complaint  Chief Complaint  Patient presents with   Annual Exam    HPI  Patient presents for annual CPE  and follow up   BPH: he used to see Dr. Artis Flock however since 2019 he switched provider and is now going to [facility] in [place], he states recently had PSA done and it was 1.6 . He is on Finasteride and Tamsulosin, explained due to his age no longer needs to have PSA done    Afib with  AV node ablation: Nov 2018, doing well , denies chest pain, palpitation,  or decrease in exercise tolerance off medication since ablation,  he is not on anticoagulation . He takes aspirin and has cardizem to take prn only , but has not used in a while    Chronic venous insufficiency: seeing Dr. Wyn Quaker, last visit 03/2018, he has a history of phlebitis but not recurrence since first episode in 2019. He denies pain or edema, doing well at this time    Emphysema : he had COVID-17 Mar 2019 but no problems since. He  states he is back to his baseline, no cough, wheezing or SOB.  He still smokes - he says he does not inhale. He does not want any inhalers . Refuses inhaler    Aorta Atherosclerosis/CAD without angina/hyperlipidemia : he continues to refuse statin therapy, he is on a healthy diet  , he is on aspirin occasionally.  He also has atherosclerosis of Left anterior descending coronary. He denies decrease in exercise tolerance, no chest pain.   IPSS     Row Name 08/04/23 0954         International Prostate Symptom Score   How often have you had the sensation of not emptying your bladder? Not at All     How often have you had to urinate less than every two hours? Not at All     How often have you found you stopped and started again several times when you urinated? Not at All     How often have you found it difficult to postpone urination? Not at All     How often have you had a  weak urinary stream? Not at All     How often have you had to strain to start urination? Not at All     How many times did you typically get up at night to urinate? None     Total IPSS Score 0       Quality of Life due to urinary symptoms   If you were to spend the rest of your life with your urinary condition just the way it is now how would you feel about that? Pleased              Diet: healthy diet  Exercise: continue regular physical activity  Last Dental Exam: up to date Last Eye Exam: due for follow up  Depression: phq 9 is negative    08/04/2023    9:51 AM 07/29/2022   10:20 AM 06/12/2022   10:29 AM 04/25/2022    7:59 AM 04/07/2022    2:36 PM  Depression screen PHQ 2/9  Decreased Interest 0 0 0 0 0  Down, Depressed, Hopeless 0 0 0 0 0  PHQ - 2 Score 0 0 0 0 0  Altered sleeping 0 0  0  Tired, decreased energy 0 0   0  Change in appetite 0 0   0  Feeling bad or failure about yourself  0 0   0  Trouble concentrating 0 0   0  Moving slowly or fidgety/restless 0 0   0  Suicidal thoughts 0 0   0  PHQ-9 Score 0 0   0  Difficult doing work/chores Not difficult at all        Hypertension:  BP Readings from Last 3 Encounters:  08/04/23 118/78  07/29/22 120/74  04/07/22 120/72    Obesity: Wt Readings from Last 3 Encounters:  08/04/23 164 lb 6.4 oz (74.6 kg)  07/29/22 168 lb (76.2 kg)  06/12/22 164 lb (74.4 kg)   BMI Readings from Last 3 Encounters:  08/04/23 23.93 kg/m  07/29/22 24.45 kg/m  06/12/22 23.53 kg/m     Flowsheet Row Office Visit from 08/04/2023 in [facility]  AUDIT-C Score 4        Married STD testing and prevention (HIV/chl/gon/syphilis):  not applicable Sexual history: low libido no ED Hep C Screening: completed Skin cancer: Discussed monitoring for atypical lesions Colorectal cancer: due in 2026 Prostate cancer:  not applicable Lab Results  Component Value Date   PSA 1.5 02/26/2021   PSA 2.3 09/14/2019    PSA 2.2 09/02/2018     Lung cancer:  Low Dose CT Chest recommended if Age 19-80 years, 30 pack-year currently smoking OR have quit w/in 15years. Patient  is a candidate for screening. Not interested  ECG:  Sinus   Vaccines: reviewed with the patient.   Advanced Care Planning: A voluntary discussion about advance care planning including the explanation and discussion of advance directives.  Discussed health care proxy and Living will, and the patient was able to identify a health care proxy as wife .  Patient does have a living will and power of attorney of health care   Patient Active Problem List   Diagnosis Date Noted   Pulmonary hypertension, unspecified (HCC) 09/05/2021   DDD (degenerative disc disease), lumbosacral 02/26/2021   Centrilobular emphysema (HCC) 02/26/2021   Coronary artery disease involving native coronary artery of native heart without angina pectoris 12/13/2019   Atherosclerosis of aorta (HCC) 12/13/2019   Senile purpura (HCC) 12/13/2019   Personal history of tobacco use, presenting hazards to health 11/25/2018   Paroxysmal atrial fibrillation (HCC) 03/03/2017   Bradycardia 08/31/2015   Allergic rhinitis, seasonal 04/20/2015   Chronic venous insufficiency 10/27/2014   ED (erectile dysfunction) of non-organic origin 10/27/2014   Colon, diverticulosis 10/27/2014   Benign prostatic hyperplasia without urinary obstruction 10/27/2014   Bundle branch block, right and left anterior fascicular 06/07/2013   Obstructive apnea 03/23/2012   Hypercholesteremia 01/18/2011   H/O squamous cell carcinoma of skin 01/18/2011   History of major vascular surgery 01/18/2011    Past Surgical History:  Procedure Laterality Date   ATRIAL FIBRILLATION ABLATION N/A 03/03/2017   Procedure: Atrial Fibrillation Ablation;  Surgeon: Hillis Range, MD;  Location: [facility] INVASIVE CV LAB;  Service: Cardiovascular;  Laterality: N/A;   COLONOSCOPY WITH PROPOFOL N/A 11/20/2014   Procedure:  COLONOSCOPY WITH PROPOFOL;  Surgeon: Scot Jun, MD;  Location: [facility] ENDOSCOPY;  Service: Endoscopy;  Laterality: N/A;   rib cartilage     benign tumor   SKIN CANCER EXCISION Right 06/2010   lower leg   TONSILLECTOMY AND ADENOIDECTOMY     VEIN LIGATION AND STRIPPING      Family  History  Problem Relation Age of Onset   Suicidality Mother    ADD / ADHD Mother    Anxiety disorder Mother    Depression Mother    Early death Mother    Leukemia Father    Hypercholesterolemia Father    Alcohol abuse Father    Hearing loss Father    Varicose Veins Father    Hypertension Brother    Hyperlipidemia Brother    Cirrhosis Brother    Alcohol abuse Brother    Early death Brother    Colon cancer Maternal Grandmother    Cancer Maternal Grandmother    Early death Maternal Grandmother    Arthritis Paternal Grandfather    Anxiety disorder Sister     Social History   Socioeconomic History   Marital status: Married    Spouse name: Gavin Pound   Number of children: 3   Years of education: 14   Highest education level: Tax adviser degree: occupational, Scientist, product/process development, or vocational program  Occupational History   Occupation: Engineer, mining  Tobacco Use   Smoking status: Every Day    Current packs/day: 1.00    Average packs/day: 1.2 packs/day for 53.7 years (63.0 ttl pk-yrs)    Types: Cigarettes, Cigars   Smokeless tobacco: Never   Tobacco comments:    Roll my own, use natural tobacco, DO NOT INHALE, yearly CAT scan on lungs, do cardio @ day  Vaping Use   Vaping status: Never Used  Substance and Sexual Activity   Alcohol use: Yes    Alcohol/week: 2.0 standard drinks of alcohol    Types: 2 Glasses of wine per week    Comment: 2-3 glasses of wine at night   Drug use: No   Sexual activity: Yes    Partners: Female    Birth control/protection: None  Other Topics Concern   Not on file  Social History Narrative   Pt lives in [place] with spouse.  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   Social Drivers of Corporate investment banker Strain: Low Risk  (08/03/2023)   Overall Financial Resource Strain (CARDIA)    Difficulty of Paying Living Expenses: Not hard at all  Food Insecurity: No Food Insecurity (08/03/2023)   Hunger Vital Sign    Worried About Running Out of Food in the Last Year: Never true    Ran Out of Food in the Last Year: Never true  Transportation Needs: No Transportation Needs (08/03/2023)   PRAPARE - Administrator, Civil Service (Medical): No    Lack of Transportation (Non-Medical): No  Physical Activity: Sufficiently Active (08/03/2023)   Exercise Vital Sign    Days of Exercise per Week: 6 days    Minutes of Exercise per Session: 60 min  Stress: No Stress Concern Present (08/03/2023)   [employer] of Occupational Health - Occupational Stress Questionnaire    Feeling of Stress : Not at all  Social Connections: Socially Integrated (08/03/2023)   Social Connection and Isolation Panel [NHANES]    Frequency of Communication with Friends and Family: More than three times a week    Frequency of Social Gatherings with Friends and Family: More than three times a week    Attends Religious Services: More than 4 times per year    Active Member of [employer] or Organizations: Yes    Attends Banker Meetings: 1 to 4 times per year    Marital Status: Married  Catering manager Violence: Not At Risk (08/04/2023)  Humiliation, Afraid, Rape, and Kick questionnaire    Fear of Current or Ex-Partner: No    Emotionally Abused: No    Physically Abused: No    Sexually Abused: No     Current Outpatient Medications:    5-Hydroxytryptophan (5-HTP) 100 MG CAPS, Take 100 mg by mouth at bedtime. , Disp: , Rfl:    aspirin EC 81 MG tablet, Take 81 mg by mouth daily., Disp: , Rfl:    Cholecalciferol (VITAMIN D3 PO), Take 1 capsule by mouth daily., Disp: , Rfl:    Coenzyme Q10 (CO Q 10) 100 MG CAPS, Take 100 mg by mouth daily., Disp: ,  Rfl:    diltiazem (CARDIZEM) 30 MG tablet, Take 1 tablet (30 mg total) by mouth every 6 (six) hours as needed (palpitations)., Disp: 30 tablet, Rfl: 1   diphenhydrAMINE (BENADRYL) 25 MG tablet, Take 25 mg by mouth at bedtime as needed., Disp: , Rfl:    finasteride (PROSCAR) 5 MG tablet, Take 1 tablet (5 mg total) by mouth daily., Disp: 30 tablet, Rfl: 0   Green Tea, Camellia sinensis, (GREEN TEA EXTRACT PO), Take by mouth., Disp: , Rfl:    Homeopathic Products (LEG CRAMP RELIEF PO), Take 2 tablets by mouth at bedtime., Disp: , Rfl:    L-Theanine 100 MG CAPS, Take 100 mg by mouth 2 (two) times daily., Disp: , Rfl:    LITHIUM PO, Take 1,000 mcg by mouth. From life extension foundation, Disp: , Rfl:    MAGNESIUM PO, Take 300 mg by mouth at bedtime. , Disp: , Rfl:    Misc Natural Products (BETA-SITOSTEROL PLANT STEROLS) CAPS, Take 1 capsule by mouth daily., Disp: , Rfl:    Multiple Vitamin (MULTIVITAMIN) tablet, Take 1 tablet by mouth daily., Disp: , Rfl:    Omega-3 Fatty Acids (FISH OIL) 1000 MG CAPS, Take 1,000 mg by mouth daily. , Disp: , Rfl:    OVER THE COUNTER MEDICATION, Take 1,500 mg by mouth daily as needed. NITRIC OXCID, Disp: , Rfl:    Polyvinyl Alcohol-Povidone (REFRESH OP), Place 2 drops into both eyes as needed (for dry eyes)., Disp: , Rfl:    Probiotic Product (PROBIOTIC DAILY PO), Take 1 capsule by mouth daily. , Disp: , Rfl:    tamsulosin (FLOMAX) 0.4 MG CAPS capsule, Take 1 capsule (0.4 mg total) by mouth daily., Disp: 90 capsule, Rfl: 3   zinc gluconate 50 MG tablet, Take 50 mg by mouth daily., Disp: , Rfl:   No Known Allergies   ROS  Constitutional: Negative for fever or weight change.  Respiratory: Negative for cough and shortness of breath.   Cardiovascular: Negative for chest pain or palpitations.  Gastrointestinal: Negative for abdominal pain, no bowel changes.  Musculoskeletal: Negative for gait problem or joint swelling.  Skin: Negative for rash.  Neurological:  Negative for dizziness or headache.  No other specific complaints in a complete review of systems (except as listed in HPI above).    Objective  Vitals:   08/04/23 0957  BP: 118/78  Pulse: (!) 58  Resp: 16  SpO2: 98%  Weight: 164 lb 6.4 oz (74.6 kg)  Height: 5' 9.5" (1.765 m)    Body mass index is 23.93 kg/m.  Physical Exam  Constitutional: Patient appears well-developed and well-nourished. No distress.  HENT: Head: Normocephalic and atraumatic. Ears: B TMs ok, no erythema or effusion; Nose: Nose normal. Mouth/Throat: Oropharynx is clear and moist. No oropharyngeal exudate.  Eyes: Conjunctivae and EOM are normal. Pupils are equal, round,  and reactive to light. No scleral icterus.  Neck: Normal range of motion. Neck supple. No JVD present. No thyromegaly present.  Cardiovascular: Normal rate, regular rhythm and normal heart sounds.  No murmur heard. No BLE edema. Pulmonary/Chest: Effort normal and breath sounds normal. No respiratory distress. Abdominal: Soft. Bowel sounds are normal, no distension. There is no tenderness. no masses MALE GENITALIA: Normal descended testes bilaterally, no masses palpated, no hernias, no lesions, no discharge RECTAL: Prostate enlarged in size but normal consistency, no rectal masses or hemorrhoids , perineal irritation, looks like fungal infection, advised clotrimazole vaginal cream  Musculoskeletal: Normal range of motion, no joint effusions. No gross deformities Neurological: he is alert and oriented to person, place, and time. No cranial nerve deficit. Coordination, balance, strength, speech and gait are normal.  Skin: Skin is warm and dry. No rash noted. No erythema.  Psychiatric: Patient has a normal mood and affect. behavior is normal. Judgment and thought content normal.     Assessment & Plan  1. Well adult exam (Primary)   2. Paroxysmal atrial fibrillation (HCC)  - diltiazem (CARDIZEM) 30 MG tablet; Take 1 tablet (30 mg total) by  mouth every 6 (six) hours as needed (palpitations).  Dispense: 15 tablet; Refill: 1  3. Centrilobular emphysema (HCC)  - Ambulatory Referral Lung Cancer Screening [facility]  4. Atherosclerosis of aorta (HCC)  Refuses statin therapy   5. Pulmonary hypertension, unspecified (HCC)  Cardiologist recommended sleep apnea but not ready to have it done  6. Coronary artery disease involving native coronary artery of native heart without angina pectoris  On aspirin  7. Chronic venous insufficiency  Seen by vascular surgeon  8. Tobacco abuse  - Ambulatory Referral Lung Cancer Screening [facility]  9. Need for pneumococcal 20-valent conjugate vaccination  - Pneumococcal conjugate vaccine 20-valent (Prevnar 20)   -Prostate cancer screening and PSA options (with potential risks and benefits of testing vs not testing) were discussed along with recent recs/guidelines. -USPSTF grade A and B recommendations reviewed with patient; age-appropriate recommendations, preventive care, screening tests, etc discussed and encouraged; healthy living encouraged; see AVS for patient education given to patient -Discussed importance of 150 minutes of physical activity weekly, eat two servings of fish weekly, eat one serving of tree nuts ( cashews, pistachios, pecans, almonds.Marland Kitchen) every other day, eat 6 servings of fruit/vegetables daily and drink plenty of water and avoid sweet beverages.  -Reviewed Health Maintenance: yes

## 2023-08-18 DIAGNOSIS — M9903 Segmental and somatic dysfunction of lumbar region: Secondary | ICD-10-CM | POA: Diagnosis not present

## 2023-08-18 DIAGNOSIS — M955 Acquired deformity of pelvis: Secondary | ICD-10-CM | POA: Diagnosis not present

## 2023-08-18 DIAGNOSIS — M9901 Segmental and somatic dysfunction of cervical region: Secondary | ICD-10-CM | POA: Diagnosis not present

## 2023-08-18 DIAGNOSIS — M9905 Segmental and somatic dysfunction of pelvic region: Secondary | ICD-10-CM | POA: Diagnosis not present

## 2023-08-18 DIAGNOSIS — M531 Cervicobrachial syndrome: Secondary | ICD-10-CM | POA: Diagnosis not present

## 2023-08-18 DIAGNOSIS — M5441 Lumbago with sciatica, right side: Secondary | ICD-10-CM | POA: Diagnosis not present

## 2023-09-14 DIAGNOSIS — M9901 Segmental and somatic dysfunction of cervical region: Secondary | ICD-10-CM | POA: Diagnosis not present

## 2023-09-14 DIAGNOSIS — M5441 Lumbago with sciatica, right side: Secondary | ICD-10-CM | POA: Diagnosis not present

## 2023-09-14 DIAGNOSIS — M9905 Segmental and somatic dysfunction of pelvic region: Secondary | ICD-10-CM | POA: Diagnosis not present

## 2023-09-14 DIAGNOSIS — M9903 Segmental and somatic dysfunction of lumbar region: Secondary | ICD-10-CM | POA: Diagnosis not present

## 2023-09-14 DIAGNOSIS — M531 Cervicobrachial syndrome: Secondary | ICD-10-CM | POA: Diagnosis not present

## 2023-09-14 DIAGNOSIS — M955 Acquired deformity of pelvis: Secondary | ICD-10-CM | POA: Diagnosis not present

## 2023-10-20 DIAGNOSIS — M531 Cervicobrachial syndrome: Secondary | ICD-10-CM | POA: Diagnosis not present

## 2023-10-20 DIAGNOSIS — M955 Acquired deformity of pelvis: Secondary | ICD-10-CM | POA: Diagnosis not present

## 2023-10-20 DIAGNOSIS — H6123 Impacted cerumen, bilateral: Secondary | ICD-10-CM | POA: Diagnosis not present

## 2023-10-20 DIAGNOSIS — M9903 Segmental and somatic dysfunction of lumbar region: Secondary | ICD-10-CM | POA: Diagnosis not present

## 2023-10-20 DIAGNOSIS — M5441 Lumbago with sciatica, right side: Secondary | ICD-10-CM | POA: Diagnosis not present

## 2023-10-20 DIAGNOSIS — M9905 Segmental and somatic dysfunction of pelvic region: Secondary | ICD-10-CM | POA: Diagnosis not present

## 2023-10-20 DIAGNOSIS — M9901 Segmental and somatic dysfunction of cervical region: Secondary | ICD-10-CM | POA: Diagnosis not present

## 2023-10-20 DIAGNOSIS — H900 Conductive hearing loss, bilateral: Secondary | ICD-10-CM | POA: Diagnosis not present

## 2023-10-21 LAB — LAB REPORT - SCANNED
A1c: 5.3
EGFR: 85
TSH: 1.61 (ref 0.41–5.90)

## 2023-10-27 ENCOUNTER — Ambulatory Visit: Attending: Cardiology | Admitting: Cardiology

## 2023-10-27 ENCOUNTER — Encounter: Payer: Self-pay | Admitting: Cardiology

## 2023-10-27 VITALS — BP 138/73 | HR 64 | Resp 16 | Ht 69.0 in | Wt 163.2 lb

## 2023-10-27 DIAGNOSIS — I480 Paroxysmal atrial fibrillation: Secondary | ICD-10-CM | POA: Diagnosis not present

## 2023-10-27 DIAGNOSIS — D6869 Other thrombophilia: Secondary | ICD-10-CM | POA: Diagnosis not present

## 2023-10-27 DIAGNOSIS — I10 Essential (primary) hypertension: Secondary | ICD-10-CM | POA: Diagnosis not present

## 2023-10-27 DIAGNOSIS — I491 Atrial premature depolarization: Secondary | ICD-10-CM | POA: Diagnosis not present

## 2023-10-27 NOTE — Patient Instructions (Signed)

## 2023-10-27 NOTE — Progress Notes (Unsigned)
 Electrophysiology Office Note:   Date:  10/28/2023  ID:  Steven Griffin, DOB [birth year], MRN [id number]  Primary Cardiologist: None Electrophysiologist: Fonda Kitty, MD      History of Present Illness:   Steven Griffin is a 78 y.o. male with h/o paroxysmal atrial fibrillation s/p ablation 2018, HTN who is being seen today for follow up for his atrial fibrillation.   Discussed the use of AI scribe software for clinical note transcription with the patient, who gave verbal consent to proceed.  History of Present Illness Steven Griffin is a 78 year old male with atrial fibrillation who presents for follow-up after an ablation procedure. He underwent an ablation procedure in 2018. Since then, he has not experienced any significant episodes of atrial fibrillation. He keeps diltiazem  on hand, which he has not used in seven years, but renews the prescription regularly.  He is proactive about his health, regularly conducting blood work through the [employer], which provides a Advertising copywriter. He monitors his LDL cholesterol, which has been improving with lifestyle changes. He maintains a healthy lifestyle with regular exercise, a balanced diet, and intermittent fasting. He avoids fried foods, sugars, and fats, and typically consumes one large protein-rich meal a day.  He prefers holistic approaches to health management and prefers to avoid medications. He manages his stress well. 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 He has a primary care doctor who receives copies of his blood work results and assists in general health maintenance.   Review of systems complete and found to be negative unless listed in HPI.   EP Information / Studies Reviewed:    EKG is ordered today. Personal review as below.  EKG Interpretation Date/Time:  Tuesday October 27 2023 14:43:07 EDT Ventricular Rate:  64 PR Interval:  160 QRS Duration:  134 QT  Interval:  440 QTC Calculation: 453 R Axis:   -77  Text Interpretation: Sinus rhythm with Premature supraventricular complexes and with occasional Premature ventricular complexes Right bundle branch block Left anterior fascicular block Bifascicular block When compared with ECG of 03-Apr-2017 08:44, Premature ventricular complexes are now Present Premature supraventricular complexes are now Present Confirmed by Kitty Fonda [phone]) on 10/27/2023 3:26:39 PM   Echo 08/2021:   1. Distal lateral wall and apex are mildly hypokinetic. Steven Griffin Left  ventricular ejection fraction, by estimation, is 50 to 55%. The left  ventricle has normal function. The left ventricular internal cavity size  was moderately dilated.   2. Right ventricular systolic function is normal. The right ventricular  size is normal.   3. Left atrial size was moderately dilated.   4. Right atrial size was mildly dilated.   5. The mitral valve is abnormal. Mild mitral valve regurgitation.   6. The aortic valve is tricuspid. Aortic valve regurgitation is not  visualized. Aortic valve sclerosis is present, with no evidence of aortic  valve stenosis.   7. The inferior vena cava is normal in size with greater than 50%  respiratory variability, suggesting right atrial pressure of 3 mmHg.    Physical Exam:   VS:  BP 138/73   Pulse 64   Resp 16   Ht 5' 9 (1.753 m)   Wt 163 lb 3.2 oz (74 kg)   SpO2 95%   BMI 24.10 kg/m    Wt Readings from Last 3 Encounters:  10/27/23 163 lb 3.2 oz (74 kg)  08/04/23 164 lb 6.4 oz (74.6  kg)  07/29/22 168 lb (76.2 kg)     GEN: Well nourished, well developed in no acute distress NECK: No JVD CARDIAC: Normal rate, regular RESPIRATORY:  Clear to auscultation without rales, wheezing or rhonchi  ABDOMEN: Soft, non-distended EXTREMITIES:  No edema; No deformity   ASSESSMENT AND PLAN:    #Paroxysmal atrial fibrillation: S/p ablation in 2018 with Dr. Kelsie. Doing well without known recurrence.   #Frequent PACs: Asymptomatic. -Continue diltiazem  30mg  as needed.   #Secondary hypercoagulable state due to AF:  - He has been off OAC since ablation. Risks and benefits to OAC as stroke prophylaxis have been discussed. Patient chooses to remain off anti-coagulation. If he has recurrence of AF, then willing to readdress.  #Hypertension -At goal today.  Recommend checking blood pressures 1-2 times per week at home and recording the values.  Recommend bringing these recordings to the primary care physician.  Follow up with Dr. Kennyth in 6 months  Signed, Fonda Kennyth, MD

## 2023-11-17 DIAGNOSIS — M531 Cervicobrachial syndrome: Secondary | ICD-10-CM | POA: Diagnosis not present

## 2023-11-17 DIAGNOSIS — M955 Acquired deformity of pelvis: Secondary | ICD-10-CM | POA: Diagnosis not present

## 2023-11-17 DIAGNOSIS — M9901 Segmental and somatic dysfunction of cervical region: Secondary | ICD-10-CM | POA: Diagnosis not present

## 2023-11-17 DIAGNOSIS — M9905 Segmental and somatic dysfunction of pelvic region: Secondary | ICD-10-CM | POA: Diagnosis not present

## 2023-11-17 DIAGNOSIS — M9903 Segmental and somatic dysfunction of lumbar region: Secondary | ICD-10-CM | POA: Diagnosis not present

## 2023-11-17 DIAGNOSIS — M5441 Lumbago with sciatica, right side: Secondary | ICD-10-CM | POA: Diagnosis not present

## 2023-11-18 ENCOUNTER — Ambulatory Visit: Admission: RE | Admit: 2023-11-18 | Source: Ambulatory Visit

## 2023-11-19 ENCOUNTER — Ambulatory Visit
Admission: RE | Admit: 2023-11-19 | Discharge: 2023-11-19 | Disposition: A | Discharge status: Home / Self Care | Source: Ambulatory Visit | Attending: Acute Care | Admitting: Acute Care

## 2023-11-19 DIAGNOSIS — F17210 Nicotine dependence, cigarettes, uncomplicated: Secondary | ICD-10-CM | POA: Insufficient documentation

## 2023-11-19 DIAGNOSIS — Z122 Encounter for screening for malignant neoplasm of respiratory organs: Secondary | ICD-10-CM | POA: Diagnosis not present

## 2023-11-19 DIAGNOSIS — Z87891 Personal history of nicotine dependence: Secondary | ICD-10-CM | POA: Insufficient documentation

## 2023-11-23 DIAGNOSIS — Z872 Personal history of diseases of the skin and subcutaneous tissue: Secondary | ICD-10-CM | POA: Diagnosis not present

## 2023-11-23 DIAGNOSIS — Z859 Personal history of malignant neoplasm, unspecified: Secondary | ICD-10-CM | POA: Diagnosis not present

## 2023-11-23 DIAGNOSIS — L2089 Other atopic dermatitis: Secondary | ICD-10-CM | POA: Diagnosis not present

## 2023-11-23 DIAGNOSIS — L578 Other skin changes due to chronic exposure to nonionizing radiation: Secondary | ICD-10-CM | POA: Diagnosis not present

## 2023-11-23 DIAGNOSIS — D492 Neoplasm of unspecified behavior of bone, soft tissue, and skin: Secondary | ICD-10-CM | POA: Diagnosis not present

## 2023-12-24 DIAGNOSIS — M9903 Segmental and somatic dysfunction of lumbar region: Secondary | ICD-10-CM | POA: Diagnosis not present

## 2023-12-24 DIAGNOSIS — M955 Acquired deformity of pelvis: Secondary | ICD-10-CM | POA: Diagnosis not present

## 2023-12-24 DIAGNOSIS — M531 Cervicobrachial syndrome: Secondary | ICD-10-CM | POA: Diagnosis not present

## 2023-12-24 DIAGNOSIS — M9901 Segmental and somatic dysfunction of cervical region: Secondary | ICD-10-CM | POA: Diagnosis not present

## 2023-12-24 DIAGNOSIS — M5441 Lumbago with sciatica, right side: Secondary | ICD-10-CM | POA: Diagnosis not present

## 2023-12-24 DIAGNOSIS — M9905 Segmental and somatic dysfunction of pelvic region: Secondary | ICD-10-CM | POA: Diagnosis not present

## 2023-12-31 DIAGNOSIS — D492 Neoplasm of unspecified behavior of bone, soft tissue, and skin: Secondary | ICD-10-CM | POA: Diagnosis not present

## 2023-12-31 DIAGNOSIS — L2089 Other atopic dermatitis: Secondary | ICD-10-CM | POA: Diagnosis not present

## 2023-12-31 DIAGNOSIS — L853 Xerosis cutis: Secondary | ICD-10-CM | POA: Diagnosis not present

## 2023-12-31 DIAGNOSIS — L570 Actinic keratosis: Secondary | ICD-10-CM | POA: Diagnosis not present

## 2024-01-19 DIAGNOSIS — M5441 Lumbago with sciatica, right side: Secondary | ICD-10-CM | POA: Diagnosis not present

## 2024-01-19 DIAGNOSIS — M955 Acquired deformity of pelvis: Secondary | ICD-10-CM | POA: Diagnosis not present

## 2024-01-19 DIAGNOSIS — M531 Cervicobrachial syndrome: Secondary | ICD-10-CM | POA: Diagnosis not present

## 2024-01-19 DIAGNOSIS — M9903 Segmental and somatic dysfunction of lumbar region: Secondary | ICD-10-CM | POA: Diagnosis not present

## 2024-01-19 DIAGNOSIS — M9905 Segmental and somatic dysfunction of pelvic region: Secondary | ICD-10-CM | POA: Diagnosis not present

## 2024-01-19 DIAGNOSIS — M9901 Segmental and somatic dysfunction of cervical region: Secondary | ICD-10-CM | POA: Diagnosis not present

## 2024-02-24 DIAGNOSIS — M5441 Lumbago with sciatica, right side: Secondary | ICD-10-CM | POA: Diagnosis not present

## 2024-02-24 DIAGNOSIS — M9901 Segmental and somatic dysfunction of cervical region: Secondary | ICD-10-CM | POA: Diagnosis not present

## 2024-02-24 DIAGNOSIS — M9905 Segmental and somatic dysfunction of pelvic region: Secondary | ICD-10-CM | POA: Diagnosis not present

## 2024-02-24 DIAGNOSIS — M955 Acquired deformity of pelvis: Secondary | ICD-10-CM | POA: Diagnosis not present

## 2024-02-24 DIAGNOSIS — M531 Cervicobrachial syndrome: Secondary | ICD-10-CM | POA: Diagnosis not present

## 2024-02-24 DIAGNOSIS — M9903 Segmental and somatic dysfunction of lumbar region: Secondary | ICD-10-CM | POA: Diagnosis not present

## 2024-03-29 DIAGNOSIS — M5441 Lumbago with sciatica, right side: Secondary | ICD-10-CM | POA: Diagnosis not present

## 2024-03-29 DIAGNOSIS — M9901 Segmental and somatic dysfunction of cervical region: Secondary | ICD-10-CM | POA: Diagnosis not present

## 2024-03-29 DIAGNOSIS — M9905 Segmental and somatic dysfunction of pelvic region: Secondary | ICD-10-CM | POA: Diagnosis not present

## 2024-03-29 DIAGNOSIS — M955 Acquired deformity of pelvis: Secondary | ICD-10-CM | POA: Diagnosis not present

## 2024-03-29 DIAGNOSIS — M9903 Segmental and somatic dysfunction of lumbar region: Secondary | ICD-10-CM | POA: Diagnosis not present

## 2024-03-29 DIAGNOSIS — M531 Cervicobrachial syndrome: Secondary | ICD-10-CM | POA: Diagnosis not present

## 2024-04-13 ENCOUNTER — Other Ambulatory Visit: Payer: Self-pay | Admitting: Family Medicine

## 2024-04-13 DIAGNOSIS — N401 Enlarged prostate with lower urinary tract symptoms: Secondary | ICD-10-CM

## 2024-04-15 NOTE — Telephone Encounter (Signed)
 Requested Prescriptions  Pending Prescriptions Disp Refills   tamsulosin  (FLOMAX ) 0.4 MG CAPS capsule [Pharmacy Med Name: TAMSULOSIN  HCL 0.4 MG CAPSULE] 90 capsule 0    Sig: TAKE 1 CAPSULE BY MOUTH EVERY DAY     Urology: Alpha-Adrenergic Blocker Failed - 04/15/2024  1:45 PM      Failed - PSA in normal range and within 360 days    PSA  Date Value Ref Range Status  02/26/2021 1.5  Final   Prostate Specific Ag, Serum  Date Value Ref Range Status  08/31/2015 4.6 (H) 0.0 - 4.0 ng/mL Final    Comment:    Roche ECLIA methodology. According to the American Urological Association, Serum PSA should decrease and remain at undetectable levels after radical prostatectomy. The AUA defines biochemical recurrence as an initial PSA value 0.2 ng/mL or greater followed by a subsequent confirmatory PSA value 0.2 ng/mL or greater. Values obtained with different assay methods or kits cannot be used interchangeably. Results cannot be interpreted as absolute evidence of the presence or absence of malignant disease.          Passed - Last BP in normal range    BP Readings from Last 1 Encounters:  10/27/23 138/73         Passed - Valid encounter within last 12 months    Recent Outpatient Visits           8 months ago Well adult exam   [facility] Glenard Mire, MD   10 months ago Acute cough   [facility] Gareth Mliss [place], [place]       Future Appointments             In 3 months Sowles, Krichna, MD [facility], [place]

## 2024-05-02 DIAGNOSIS — N401 Enlarged prostate with lower urinary tract symptoms: Secondary | ICD-10-CM

## 2024-05-04 NOTE — Telephone Encounter (Signed)
 Refilled 04/15/24 # 90. Requested Prescriptions  Refused Prescriptions Disp Refills   tamsulosin  (FLOMAX ) 0.4 MG CAPS capsule [Pharmacy Med Name: TAMSULOSIN  HCL 0.4 MG CAPSULE] 90 capsule 0    Sig: TAKE 1 CAPSULE BY MOUTH EVERY DAY     Urology: Alpha-Adrenergic Blocker Failed - 05/04/2024  9:56 AM      Failed - PSA in normal range and within 360 days    PSA  Date Value Ref Range Status  02/26/2021 1.5  Final   Prostate Specific Ag, Serum  Date Value Ref Range Status  08/31/2015 4.6 (H) 0.0 - 4.0 ng/mL Final    Comment:    Roche ECLIA methodology. According to the American Urological Association, Serum PSA should decrease and remain at undetectable levels after radical prostatectomy. The AUA defines biochemical recurrence as an initial PSA value 0.2 ng/mL or greater followed by a subsequent confirmatory PSA value 0.2 ng/mL or greater. Values obtained with different assay methods or kits cannot be used interchangeably. Results cannot be interpreted as absolute evidence of the presence or absence of malignant disease.          Passed - Last BP in normal range    BP Readings from Last 1 Encounters:  10/27/23 138/73         Passed - Valid encounter within last 12 months    Recent Outpatient Visits           9 months ago Well adult exam   [facility] Glen[place] Mire, MD   10 months ago Acute cough   [facility] Gareth Mliss [place], [place]       Future Appointments             In 3 months Sowles, Krichna, MD [facility], [place]

## 2024-06-02 ENCOUNTER — Ambulatory Visit (INDEPENDENT_AMBULATORY_CARE_PROVIDER_SITE_OTHER): Payer: Medicare HMO

## 2024-06-02 VITALS — BP 120/80 | Ht 70.0 in | Wt 162.4 lb

## 2024-06-02 DIAGNOSIS — Z0000 Encounter for general adult medical examination without abnormal findings: Secondary | ICD-10-CM | POA: Diagnosis not present

## 2024-06-02 NOTE — Patient Instructions (Addendum)
 [name],  Thank you for taking the time for your Medicare Wellness Visit. I appreciate your continued commitment to your health goals. Please review the care plan we discussed, and feel free to reach out if I can assist you further.  Please note that Annual Wellness Visits do not include a physical exam. Some assessments may be limited, especially if the visit was conducted virtually. If needed, we may recommend an in-person follow-up with your provider.  Ongoing Care Seeing your primary care provider every 3 to 6 months helps us  monitor your health and provide consistent, personalized care. 08/08/24 @ 10:40 AM APPT FOR PHYSICAL W/ DR.SOWLES  Referrals If a referral was made during today's visit and you haven't received any updates within two weeks, please contact the referred provider directly to check on the status.  Recommended Screenings:  Health Maintenance  Topic Date Due   Medicare Annual Wellness Visit  06/13/2023   Flu Shot  11/27/2023   COVID-19 Vaccine (1 - 2025-26 season) Never done   Screening for Lung Cancer  11/18/2024   Colon Cancer Screening  11/19/2024   DTaP/Tdap/Td vaccine (3 - Td or Tdap) 02/08/2033   Pneumococcal Vaccine for age over 54  Completed   Hepatitis C Screening  Completed   Zoster (Shingles) Vaccine  Completed   Meningitis B Vaccine  Aged Out       06/02/2024    9:43 AM  Advanced Directives  Does Patient Have a Medical Advance Directive? Yes  Type of Estate Agent of [place];Living will  Does patient want to make changes to medical advance directive? No - Patient declined  Copy of Healthcare Power of Attorney in Chart? Yes - validated most recent copy scanned in chart (See row information)    Vision: Annual vision screenings are recommended for early detection of glaucoma, cataracts, and diabetic retinopathy. These exams can also reveal signs of chronic conditions such as diabetes and high blood pressure.  Dental: Annual  dental screenings help detect early signs of oral cancer, gum disease, and other conditions linked to overall health, including heart disease and diabetes.  Please see the attached documents for additional preventive care recommendations.   NEXT AWV 06/08/25 @ 8:50 AM IN PERSON

## 2024-06-02 NOTE — Progress Notes (Signed)
 "  Chief Complaint  Patient presents with   Medicare Wellness     Subjective:   Steven Griffin is a 79 y.o. male who presents for a Medicare Annual Wellness Visit.  Visit info / Clinical Intake: Medicare Wellness Visit Type:: Subsequent Annual Wellness Visit Persons participating in visit and providing information:: patient Medicare Wellness Visit Mode:: In-person (required for WTM) Interpreter Needed?: No Pre-visit prep was completed: yes AWV questionnaire completed by patient prior to visit?: no Living arrangements:: lives with spouse/significant other Patient's Overall Health Status Rating: excellent Typical amount of pain: none Does pain affect daily life?: no Are you currently prescribed opioids?: no  Dietary Habits and Nutritional Risks How many meals a day?: (!) 1 Eats fruit and vegetables daily?: yes Most meals are obtained by: preparing own meals In the last 2 weeks, have you had any of the following?: none Diabetic:: no  Functional Status Activities of Daily Living (to include ambulation/medication): Independent Ambulation: Independent Medication Administration: Independent Home Management (perform basic housework or laundry): Independent Manage your own finances?: yes Primary transportation is: driving Concerns about vision?: no *vision screening is required for WTM* ([facility]) Concerns about hearing?: no  Fall Screening Falls in the past year?: 0 Number of falls in past year: 0 Was there an injury with Fall?: 0 Fall Risk Category Calculator: 0 Patient Fall Risk Level: Low Fall Risk  Fall Risk Patient at Risk for Falls Due to: No Fall Risks Fall risk Follow up: Falls evaluation completed; Falls prevention discussed  Home and Transportation Safety: All rugs have non-skid backing?: yes All stairs or steps have railings?: yes Grab bars in the bathtub or shower?: (!) no Have non-skid surface in bathtub or shower?: yes Good home lighting?:  yes Regular seat belt use?: yes Hospital stays in the last year:: no  Cognitive Assessment Difficulty concentrating, remembering, or making decisions? : no Will 6CIT or Mini Cog be Completed: yes What year is it?: 0 points What month is it?: 0 points Give patient an address phrase to remember (5 components): [address] [place], [place] About what time is it?: 0 points Count backwards from 20 to 1: 0 points Say the months of the year in reverse: 0 points Repeat the address phrase from earlier: 0 points 6 CIT Score: 0 points  Advance Directives (For Healthcare) Does Patient Have a Medical Advance Directive?: Yes Does patient want to make changes to medical advance directive?: No - Patient declined Type of Advance Directive: Healthcare Power of [place]; Living will Copy of Healthcare Power of Attorney in Chart?: Yes - validated most recent copy scanned in chart (See row information) Copy of Living Will in Chart?: Yes - validated most recent copy scanned in chart (See row information)  Reviewed/Updated  Reviewed/Updated: Reviewed All (Medical, Surgical, Family, Medications, Allergies, Care Teams, Patient Goals)    Allergies (verified) Patient has no known allergies.   Current Medications (verified) Outpatient Encounter Medications as of 06/02/2024  Medication Sig   5-Hydroxytryptophan (5-HTP) 100 MG CAPS Take 100 mg by mouth at bedtime.    Cholecalciferol (VITAMIN D3 PO) Take 1 capsule by mouth daily.   CREATINE PO Take 10 mg by mouth daily.   diltiazem  (CARDIZEM ) 30 MG tablet Take 1 tablet (30 mg total) by mouth every 6 (six) hours as needed (palpitations).   GLYCINE PO Take 5 mg by mouth daily.   Green Tea, Camellia sinensis, (GREEN TEA EXTRACT PO) Take by mouth.   Homeopathic Products (LEG CRAMP RELIEF  PO) Take 2 tablets by mouth at bedtime.   LITHIUM PO Take 1,000 mcg by mouth. From life extension foundation   MAGNESIUM  PO Take 300 mg by mouth at bedtime.    METHYLENE  BLUE PO Take 3 mg by mouth daily.   Misc Natural Products (BETA-SITOSTEROL PLANT STEROLS) CAPS Take 1 capsule by mouth daily.   Multiple Vitamin (MULTIVITAMIN) tablet Take 1 tablet by mouth daily.   Omega-3 Fatty Acids (FISH OIL) 1000 MG CAPS Take 1,000 mg by mouth daily.    OVER THE COUNTER MEDICATION Take 1,500 mg by mouth daily as needed. NITRIC OXCID   Polyvinyl Alcohol-Povidone (REFRESH OP) Place 2 drops into both eyes as needed (for dry eyes).   Probiotic Product (PROBIOTIC DAILY PO) Take 1 capsule by mouth daily.    tamsulosin  (FLOMAX ) 0.4 MG CAPS capsule TAKE 1 CAPSULE BY MOUTH EVERY DAY   zinc gluconate 50 MG tablet Take 50 mg by mouth daily.   aspirin  EC 81 MG tablet Take 81 mg by mouth daily. (Patient not taking: Reported on 06/02/2024)   Coenzyme Q10 (CO Q 10) 100 MG CAPS Take 100 mg by mouth daily. (Patient not taking: Reported on 06/02/2024)   finasteride  (PROSCAR ) 5 MG tablet Take 1 tablet (5 mg total) by mouth daily. (Patient not taking: Reported on 06/02/2024)   No facility-administered encounter medications on file as of 06/02/2024.    History: Past Medical History:  Diagnosis Date   Allergic rhinitis, cause unspecified    Atrial fibrillation (HCC)    CHF (congestive heart failure) (HCC)    COPD (chronic obstructive pulmonary disease) (HCC)    pt denies   Diverticulosis of colon (without mention of hemorrhage)    Essential hypertension, benign    pt denies   Heart murmur    No longer experience   Hyperlipidemia    Impotence of organic origin    Mild anxiety    Personal history of COVID-19    Skin cancer    Unspecified venous (peripheral) insufficiency    Past Surgical History:  Procedure Laterality Date   ATRIAL FIBRILLATION ABLATION N/A 03/03/2017   Procedure: Atrial Fibrillation Ablation;  Surgeon: Kelsie Agent, MD;  Location: [facility] INVASIVE CV LAB;  Service: Cardiovascular;  Laterality: N/A;   COLONOSCOPY WITH PROPOFOL  N/A 11/20/2014   Procedure: COLONOSCOPY WITH  PROPOFOL ;  Surgeon: Lamar ONEIDA Holmes, MD;  [facility] END[facility];  Service: Endoscopy;  Laterality: N/A;   rib cartilage     benign tumor   SKIN CANCER EXCISION Right 06/2010   lower leg   TONSILLECTOMY AND ADENOIDECTOMY     VEIN LIGATION AND STRIPPING     Family History  Problem Relation Age of Onset   Suicidality Mother    ADD / ADHD Mother    Anxiety disorder Mother    Depression Mother    Early death Mother    Leukemia Father    Hypercholesterolemia Father    Alcohol abuse Father    Hearing loss Father    Varicose Veins Father    Hypertension Brother    Hyperlipidemia Brother    Cirrhosis Brother    Alcohol abuse Brother    Early death Brother    Colon cancer Maternal Grandmother    Cancer Maternal Grandmother    Early death Maternal Grandmother    Arthritis Paternal Grandfather    Anxiety disorder Sister    Social History   Occupational History   Occupation: Engineer, Mining  Tobacco Use   Smoking status: Every  Day    Current packs/day: 1.00    Average packs/day: 1.2 packs/day for 53.7 years (63.1 ttl pk-yrs)    Types: Cigarettes, Cigars   Smokeless tobacco: Never   Tobacco comments:    Roll my own, use natural tobacco, DO NOT INHALE, yearly CAT scan on lungs, do cardio @ day  Vaping Use   Vaping status: Never Used  Substance and Sexual Activity   Alcohol use: Yes    Alcohol/week: 2.0 standard drinks of alcohol    Types: 2 Glasses of wine per week    Comment: 2-3 glasses of wine at night   Drug use: No   Sexual activity: Yes    Partners: Female    Birth control/protection: None   Tobacco Counseling Ready to quit: Not Answered Counseling given: Not Answered Tobacco comments: Roll my own, use natural tobacco, DO NOT INHALE, yearly CAT scan on lungs, do cardio @ day  SDOH Screenings   Food Insecurity: No Food Insecurity (06/02/2024)  Housing: Low Risk (06/02/2024)  Transportation Needs: No Transportation Needs (06/02/2024)  Utilities: Not At Risk  (06/02/2024)  Alcohol Screen: Low Risk (08/03/2023)  Depression (PHQ2-9): Low Risk (06/02/2024)  Financial Resource Strain: Low Risk (08/03/2023)  Physical Activity: Sufficiently Active (06/02/2024)  Social Connections: Socially Integrated (06/02/2024)  Stress: No Stress Concern Present (06/02/2024)  Tobacco Use: High Risk (06/02/2024)  Health Literacy: Adequate Health Literacy (06/02/2024)   See flowsheets for full screening details  Depression Screen PHQ 2 & 9 Depression Scale- Over the past 2 weeks, how often have you been bothered by any of the following problems? Little interest or pleasure in doing things: 0 Feeling down, depressed, or hopeless (PHQ Adolescent also includes...irritable): 0 PHQ-2 Total Score: 0 Trouble falling or staying asleep, or sleeping too much: 0 Feeling tired or having little energy: 0 Poor appetite or overeating (PHQ Adolescent also includes...weight loss): 0 Feeling bad about yourself - or that you are a failure or have let yourself or your family down: 0 Trouble concentrating on things, such as reading the newspaper or watching television (PHQ Adolescent also includes...like school work): 0 Moving or speaking so slowly that other people could have noticed. Or the opposite - being so fidgety or restless that you have been moving around a lot more than usual: 0 Thoughts that you would be better off dead, or of hurting yourself in some way: 0 PHQ-9 Total Score: 0 If you checked off any problems, how difficult have these problems made it for you to do your work, take care of things at home, or get along with other people?: Not difficult at all  Depression Treatment Depression Interventions/Treatment : [id number] Score <4 Follow-up Not Indicated     Goals Addressed             This Visit's Progress    DIET - EAT MORE FRUITS AND VEGETABLES               Objective:    Today's Vitals   06/02/24 0926  BP: 120/80  Weight: 162 lb 6.4 oz (73.7 kg)  Height: 5' 10  (1.778 m)   Body mass index is 23.3 kg/m.  Hearing/Vision screen No results found. Immunizations and Health Maintenance Health Maintenance  Topic Date Due   Influenza Vaccine  11/27/2023   COVID-19 Vaccine (1 - 2025-26 season) Never done   Lung Cancer Screening  11/18/2024   Colonoscopy  11/19/2024   Medicare Annual Wellness (AWV)  06/02/2025   DTaP/Tdap/Td (3 - Td or  Tdap) 02/08/2033   Pneumococcal Vaccine: 50+ Years  Completed   Hepatitis C Screening  Completed   Zoster Vaccines- Shingrix   Completed   Meningococcal B Vaccine  Aged Out        Assessment/Plan:  This is a routine wellness examination for Blue.  Patient Care Team: Sowles, Krichna, MD as PCP - General (Family Medicine) Kennyth Chew, MD as PCP - Electrophysiology (Cardiology) Darron Deatrice LABOR, MD as Consulting Physician (Cardiology) Kelsie Agent, MD (Inactive) as Consulting Physician (Cardiology) Cathlyn Seal, MD (Dermatology) [facility], [facility] Edda Mt, MD (Otolaryngology)  I have personally reviewed and noted the following in the patients chart:   Medical and social history Use of alcohol, tobacco or illicit drugs  Current medications and supplements including opioid prescriptions. Functional ability and status Nutritional status Physical activity Advanced directives List of other physicians Hospitalizations, surgeries, and ER visits in previous 12 months Vitals Screenings to include cognitive, depression, and falls Referrals and appointments  No orders of the defined types were placed in this encounter.  In addition, I have reviewed and discussed with patient certain preventive protocols, quality metrics, and best practice recommendations. A written personalized care plan for preventive services as well as general preventive health recommendations were provided to patient.   Jhonnie GORMAN Das, LPN   10/30/7971   Return in about 1 year (around 06/02/2025).  After  Visit Summary: (In Person-Printed) AVS printed and given to the patient  Nurse Notes: UTD ON SHOTS, DECLINES FLU SHOT THIS YEAR; UTD ON COLONOSCOPY  No voiced or noted concerns at this time  "

## 2024-08-08 ENCOUNTER — Encounter: Admitting: Family Medicine

## 2025-06-08 ENCOUNTER — Ambulatory Visit
